# Patient Record
Sex: Male | Born: 1950 | Race: White | Hispanic: No | Marital: Married | Smoking: Former smoker
Health system: Southern US, Community
[De-identification: ages and names within clinical notes are randomized; demographics above are authoritative.]

## PROBLEM LIST (undated history)

## (undated) DIAGNOSIS — I639 Cerebral infarction, unspecified: Secondary | ICD-10-CM

## (undated) DIAGNOSIS — I447 Left bundle-branch block, unspecified: Secondary | ICD-10-CM

## (undated) DIAGNOSIS — E785 Hyperlipidemia, unspecified: Secondary | ICD-10-CM

## (undated) DIAGNOSIS — Z950 Presence of cardiac pacemaker: Secondary | ICD-10-CM

## (undated) DIAGNOSIS — I509 Heart failure, unspecified: Secondary | ICD-10-CM

## (undated) DIAGNOSIS — I10 Essential (primary) hypertension: Secondary | ICD-10-CM

## (undated) DIAGNOSIS — I255 Ischemic cardiomyopathy: Secondary | ICD-10-CM

## (undated) DIAGNOSIS — E119 Type 2 diabetes mellitus without complications: Secondary | ICD-10-CM

## (undated) DIAGNOSIS — I5020 Unspecified systolic (congestive) heart failure: Secondary | ICD-10-CM

## (undated) DIAGNOSIS — I2510 Atherosclerotic heart disease of native coronary artery without angina pectoris: Secondary | ICD-10-CM

## (undated) DIAGNOSIS — T783XXA Angioneurotic edema, initial encounter: Secondary | ICD-10-CM

## (undated) HISTORY — PX: CHOLECYSTECTOMY: SHX55

## (undated) HISTORY — DX: Angioneurotic edema, initial encounter: T78.3XXA

## (undated) HISTORY — PX: CERVICAL DISC SURGERY: SHX588

---

## 2012-10-27 DIAGNOSIS — Z8673 Personal history of transient ischemic attack (TIA), and cerebral infarction without residual deficits: Secondary | ICD-10-CM | POA: Insufficient documentation

## 2015-02-18 DIAGNOSIS — I10 Essential (primary) hypertension: Secondary | ICD-10-CM | POA: Insufficient documentation

## 2015-02-18 DIAGNOSIS — E785 Hyperlipidemia, unspecified: Secondary | ICD-10-CM | POA: Insufficient documentation

## 2015-02-18 DIAGNOSIS — K219 Gastro-esophageal reflux disease without esophagitis: Secondary | ICD-10-CM | POA: Insufficient documentation

## 2015-02-18 DIAGNOSIS — F419 Anxiety disorder, unspecified: Secondary | ICD-10-CM | POA: Insufficient documentation

## 2015-02-21 ENCOUNTER — Emergency Department
Admission: EM | Admit: 2015-02-21 | Discharge: 2015-02-21 | Disposition: A | Discharge status: Home / Self Care | Payer: Medicare HMO | Attending: Emergency Medicine | Admitting: Emergency Medicine

## 2015-02-21 DIAGNOSIS — S0502XA Injury of conjunctiva and corneal abrasion without foreign body, left eye, initial encounter: Secondary | ICD-10-CM | POA: Diagnosis not present

## 2015-02-21 DIAGNOSIS — W2209XA Striking against other stationary object, initial encounter: Secondary | ICD-10-CM | POA: Insufficient documentation

## 2015-02-21 DIAGNOSIS — E119 Type 2 diabetes mellitus without complications: Secondary | ICD-10-CM | POA: Insufficient documentation

## 2015-02-21 DIAGNOSIS — Y9389 Activity, other specified: Secondary | ICD-10-CM | POA: Insufficient documentation

## 2015-02-21 DIAGNOSIS — I10 Essential (primary) hypertension: Secondary | ICD-10-CM | POA: Insufficient documentation

## 2015-02-21 DIAGNOSIS — Y998 Other external cause status: Secondary | ICD-10-CM | POA: Diagnosis not present

## 2015-02-21 DIAGNOSIS — Y9289 Other specified places as the place of occurrence of the external cause: Secondary | ICD-10-CM | POA: Insufficient documentation

## 2015-02-21 DIAGNOSIS — S0592XA Unspecified injury of left eye and orbit, initial encounter: Secondary | ICD-10-CM | POA: Diagnosis present

## 2015-02-21 HISTORY — DX: Cerebral infarction, unspecified: I63.9

## 2015-02-21 HISTORY — DX: Type 2 diabetes mellitus without complications: E11.9

## 2015-02-21 HISTORY — DX: Essential (primary) hypertension: I10

## 2015-02-21 MED ORDER — EYE WASH OPHTH SOLN
OPHTHALMIC | Status: AC
  Administered 2015-02-21: 22:00:00
  Filled 2015-02-21: qty 118

## 2015-02-21 MED ORDER — FLUORESCEIN SODIUM 1 MG OP STRP
ORAL_STRIP | OPHTHALMIC | Status: AC
  Administered 2015-02-21: 22:00:00
  Filled 2015-02-21: qty 1

## 2015-02-21 MED ORDER — TETRACAINE HCL 0.5 % OP SOLN
OPHTHALMIC | Status: AC
  Administered 2015-02-21: 22:00:00
  Filled 2015-02-21: qty 2

## 2015-02-21 MED ORDER — GENTAMICIN SULFATE 0.3 % OP OINT: TOPICAL_OINTMENT | Freq: Three times a day (TID) | OPHTHALMIC | Status: DC

## 2015-02-21 NOTE — ED Notes (Signed)
Pt arrives to ER via POV with wife c/o left eye injury. Pt had a branch hit his left eye approx 6PM today. Pain and discharge since. Pt unable to open eye fully at time of triage. Pt alert and oriented X4, active, cooperative, pt in NAD. RR even and unlabored, color WNL.

## 2015-02-21 NOTE — ED Notes (Signed)
Visual acuity right eye 20/50. Left eye-unable to see any of the letter. Pt wearing corrective glasses.

## 2015-02-21 NOTE — ED Provider Notes (Signed)
[facility] Emergency Department Provider Note  ____________________________________________  Time seen: Approximately 9:23 PM  I have reviewed the triage vital signs and the nursing notes.   HISTORY  Chief Complaint Eye Injury    HPI Adam Douglas is a 65 y.o. male left eye pain secondary to a branch flying to his eye approximately 2 hours ago. Patient state unable to open secondary to pain. No palliative measures taken prior to arrival. He rates his pain as a 7/10. Patient described the pain as "sharp".   Past Medical History  Diagnosis Date  . Hypertension   . Diabetes mellitus without complication (HCC)   . Stroke [facility])     There are no active problems to display for this patient.   History reviewed. No pertinent past surgical history.  Current Outpatient Rx  Name  Route  Sig  Dispense  Refill  . gentamicin (GARAMYCIN) 0.3 % ophthalmic ointment   Left Eye   Place into the left eye 3 (three) times daily.   3.5 g   0     Allergies Review of patient's allergies indicates no known allergies.  No family history on file.  Social History Social History  Substance Use Topics  . Smoking status: Never Smoker   . Smokeless tobacco: None  . Alcohol Use: No    Review of Systems Constitutional: No fever/chills Eyes: No visual changes. ENT: No sore throat. Cardiovascular: Denies chest pain. Respiratory: Denies shortness of breath. Gastrointestinal: No abdominal pain.  No nausea, no vomiting.  No diarrhea.  No constipation. Genitourinary: Negative for dysuria. Musculoskeletal: Negative for back pain. Skin: Negative for rash. Neurological: Negative for headaches, focal weakness or numbness. Endocrine:Hypertension and diabetes ____________________________________________   PHYSICAL EXAM:  VITAL SIGNS: ED Triage Vitals  Enc Vitals Group     BP 02/21/15 2103 161/74 mmHg     Pulse Rate 02/21/15 2103 51     Resp 02/21/15 2103 18   Temp 02/21/15 2103 98.3 F (36.8 C)     Temp Source 02/21/15 2103 Oral     SpO2 02/21/15 2103 96 %     Weight 02/21/15 2103 222 lb (100.699 kg)     Height 02/21/15 2103 5' 9.5" (1.765 m)     Head Cir --      Peak Flow --      Pain Score 02/21/15 2107 7     Pain Loc --      Pain Edu? --      Excl. in GC? --     Constitutional: Alert and oriented. Well appearing and in no acute distress. Eyes: Conjunctivae are normal. PERRL. EOMI. first day revealed cornea abrasion inferior aspect. Head: Atraumatic. Nose: No congestion/rhinnorhea. Mouth/Throat: Mucous membranes are moist.  Oropharynx non-erythematous. Neck: No stridor.  No cervical spine tenderness to palpation. Hematological/Lymphatic/Immunilogical: No cervical lymphadenopathy. Cardiovascular: Normal rate, regular rhythm. Grossly normal heart sounds.  Good peripheral circulation. Blood pressure Respiratory: Normal respiratory effort.  No retractions. Lungs CTAB. Gastrointestinal: Soft and nontender. No distention. No abdominal bruits. No CVA tenderness. Musculoskeletal: No lower extremity tenderness nor edema.  No joint effusions. Neurologic:  Normal speech and language. No gross focal neurologic deficits are appreciated. No gait instability. Skin:  Skin is warm, dry and intact. No rash noted. Psychiatric: Mood and affect are normal. Speech and behavior are normal.  ____________________________________________   LABS (all labs ordered are listed, but only abnormal results are displayed)  Labs Reviewed - No data to display ____________________________________________  EKG   ____________________________________________  RADIOLOGY   ____________________________________________   PROCEDURES  Procedure(s) performed: None  Critical Care performed: No  ____________________________________________   INITIAL IMPRESSION / ASSESSMENT AND PLAN / ED COURSE  Pertinent labs & imaging results that were available during my  care of the patient were reviewed by me and considered in my medical decision making (see chart for details).  Left corneal abrasion. Skin discharged care instructions. Patient given antibiotic and advised follow-up with his eye doctor within 3 days. ____________________________________________   FINAL CLINICAL IMPRESSION(S) / ED DIAGNOSES  Final diagnoses:  Left corneal abrasion, initial encounter       Joni Reining, PA-C 02/21/15 2139  Myrna Blazer, MD 02/21/15 2322

## 2015-02-21 NOTE — Discharge Instructions (Signed)
Corneal Abrasion °The cornea is the clear covering at the front and center of the eye. When you look at the colored portion of the eye, you are looking through the cornea. It is a thin tissue made up of layers. The top layer is the most sensitive layer. A corneal abrasion happens if this layer is scratched or an injury causes it to come off.  °HOME CARE °· You may be given drops or a medicated cream. Use the medicine as told by your doctor. °· A pressure patch may be put over the eye. If this is done, follow your doctor's instructions for when to remove the patch. Do not drive or use machines while the eye patch is on. Judging distances is hard to do with a patch on. °· See your doctor for a follow-up exam if you are told to do so. It is very important that you keep this appointment. °GET HELP IF:  °· You have pain, are sensitive to light, and have a scratchy feeling in one eye or both eyes. °· Your pressure patch keeps getting loose. You can blink your eye under the patch. °· You have fluid coming from your eye or the lids stick together in the morning. °· You have the same symptoms in the morning that you did with the first abrasion. This could be days, weeks, or months after the first abrasion healed. °  °This information is not intended to replace advice given to you by your health care provider. Make sure you discuss any questions you have with your health care provider. °  °Document Released: 06/10/2007 Document Revised: 09/12/2014 Document Reviewed: 08/29/2012 °Elsevier Interactive Patient Education ©2016 [employer] ° °

## 2016-06-23 DIAGNOSIS — E1169 Type 2 diabetes mellitus with other specified complication: Secondary | ICD-10-CM | POA: Insufficient documentation

## 2016-06-23 DIAGNOSIS — Z794 Long term (current) use of insulin: Secondary | ICD-10-CM

## 2016-06-23 DIAGNOSIS — E119 Type 2 diabetes mellitus without complications: Secondary | ICD-10-CM | POA: Insufficient documentation

## 2017-03-02 ENCOUNTER — Inpatient Hospital Stay
Admission: EM | Admit: 2017-03-02 | Discharge: 2017-03-06 | DRG: 280 | Disposition: A | Discharge status: Home / Self Care | Payer: Medicare HMO | Attending: Internal Medicine | Admitting: Internal Medicine

## 2017-03-02 ENCOUNTER — Encounter: Payer: Self-pay | Admitting: Emergency Medicine

## 2017-03-02 ENCOUNTER — Emergency Department: Payer: Medicare HMO

## 2017-03-02 ENCOUNTER — Other Ambulatory Visit: Payer: Self-pay

## 2017-03-02 DIAGNOSIS — R0602 Shortness of breath: Secondary | ICD-10-CM

## 2017-03-02 DIAGNOSIS — N183 Chronic kidney disease, stage 3 (moderate): Secondary | ICD-10-CM | POA: Diagnosis present

## 2017-03-02 DIAGNOSIS — I2510 Atherosclerotic heart disease of native coronary artery without angina pectoris: Secondary | ICD-10-CM | POA: Diagnosis present

## 2017-03-02 DIAGNOSIS — Z6834 Body mass index (BMI) 34.0-34.9, adult: Secondary | ICD-10-CM

## 2017-03-02 DIAGNOSIS — E669 Obesity, unspecified: Secondary | ICD-10-CM | POA: Diagnosis present

## 2017-03-02 DIAGNOSIS — I130 Hypertensive heart and chronic kidney disease with heart failure and stage 1 through stage 4 chronic kidney disease, or unspecified chronic kidney disease: Secondary | ICD-10-CM | POA: Diagnosis present

## 2017-03-02 DIAGNOSIS — I5023 Acute on chronic systolic (congestive) heart failure: Secondary | ICD-10-CM | POA: Diagnosis present

## 2017-03-02 DIAGNOSIS — I214 Non-ST elevation (NSTEMI) myocardial infarction: Principal | ICD-10-CM | POA: Diagnosis present

## 2017-03-02 DIAGNOSIS — I248 Other forms of acute ischemic heart disease: Secondary | ICD-10-CM | POA: Diagnosis not present

## 2017-03-02 DIAGNOSIS — E1122 Type 2 diabetes mellitus with diabetic chronic kidney disease: Secondary | ICD-10-CM | POA: Diagnosis present

## 2017-03-02 DIAGNOSIS — Z87891 Personal history of nicotine dependence: Secondary | ICD-10-CM

## 2017-03-02 DIAGNOSIS — R778 Other specified abnormalities of plasma proteins: Secondary | ICD-10-CM

## 2017-03-02 DIAGNOSIS — Z79899 Other long term (current) drug therapy: Secondary | ICD-10-CM

## 2017-03-02 DIAGNOSIS — I509 Heart failure, unspecified: Secondary | ICD-10-CM

## 2017-03-02 DIAGNOSIS — R7989 Other specified abnormal findings of blood chemistry: Secondary | ICD-10-CM

## 2017-03-02 DIAGNOSIS — N179 Acute kidney failure, unspecified: Secondary | ICD-10-CM | POA: Diagnosis not present

## 2017-03-02 DIAGNOSIS — E785 Hyperlipidemia, unspecified: Secondary | ICD-10-CM | POA: Diagnosis not present

## 2017-03-02 DIAGNOSIS — Z794 Long term (current) use of insulin: Secondary | ICD-10-CM

## 2017-03-02 DIAGNOSIS — I447 Left bundle-branch block, unspecified: Secondary | ICD-10-CM | POA: Diagnosis present

## 2017-03-02 DIAGNOSIS — I420 Dilated cardiomyopathy: Secondary | ICD-10-CM | POA: Diagnosis present

## 2017-03-02 DIAGNOSIS — Z8673 Personal history of transient ischemic attack (TIA), and cerebral infarction without residual deficits: Secondary | ICD-10-CM

## 2017-03-02 DIAGNOSIS — J9601 Acute respiratory failure with hypoxia: Secondary | ICD-10-CM | POA: Diagnosis present

## 2017-03-02 HISTORY — DX: Atherosclerotic heart disease of native coronary artery without angina pectoris: I25.10

## 2017-03-02 HISTORY — DX: Unspecified systolic (congestive) heart failure: I50.20

## 2017-03-02 HISTORY — DX: Left bundle-branch block, unspecified: I44.7

## 2017-03-02 HISTORY — DX: Hyperlipidemia, unspecified: E78.5

## 2017-03-02 HISTORY — DX: Ischemic cardiomyopathy: I25.5

## 2017-03-02 HISTORY — DX: Morbid (severe) obesity due to excess calories: E66.01

## 2017-03-02 LAB — CBC WITH DIFFERENTIAL/PLATELET
Basophils Absolute: 0 10*3/uL (ref 0–0.1)
Basophils Relative: 0 %
EOS PCT: 1 %
Eosinophils Absolute: 0.1 10*3/uL (ref 0–0.7)
HEMATOCRIT: 41.6 % (ref 40.0–52.0)
Hemoglobin: 13.9 g/dL (ref 13.0–18.0)
LYMPHS PCT: 7 %
Lymphs Abs: 0.9 10*3/uL — ABNORMAL LOW (ref 1.0–3.6)
MCH: 31.4 pg (ref 26.0–34.0)
MCHC: 33.4 g/dL (ref 32.0–36.0)
MCV: 94.1 fL (ref 80.0–100.0)
MONO ABS: 0.7 10*3/uL (ref 0.2–1.0)
MONOS PCT: 6 %
NEUTROS ABS: 11.1 10*3/uL — AB (ref 1.4–6.5)
Neutrophils Relative %: 86 %
PLATELETS: 202 10*3/uL (ref 150–440)
RBC: 4.42 MIL/uL (ref 4.40–5.90)
RDW: 13.8 % (ref 11.5–14.5)
WBC: 12.9 10*3/uL — ABNORMAL HIGH (ref 3.8–10.6)

## 2017-03-02 LAB — BLOOD GAS, VENOUS
Acid-base deficit: 1.7 mmol/L (ref 0.0–2.0)
Bicarbonate: 22.1 mmol/L (ref 20.0–28.0)
O2 SAT: 91.1 %
PATIENT TEMPERATURE: 37
pCO2, Ven: 34 mmHg — ABNORMAL LOW (ref 44.0–60.0)
pH, Ven: 7.42 (ref 7.250–7.430)
pO2, Ven: 60 mmHg — ABNORMAL HIGH (ref 32.0–45.0)

## 2017-03-02 MED ORDER — IPRATROPIUM-ALBUTEROL 0.5-2.5 (3) MG/3ML IN SOLN
RESPIRATORY_TRACT | Status: AC
  Administered 2017-03-02: 3 mL via RESPIRATORY_TRACT
  Filled 2017-03-02: qty 3

## 2017-03-02 MED ORDER — IPRATROPIUM-ALBUTEROL 0.5-2.5 (3) MG/3ML IN SOLN
3.0000 mL | Freq: Once | RESPIRATORY_TRACT | Status: AC
  Administered 2017-03-02: 3 mL via RESPIRATORY_TRACT

## 2017-03-02 NOTE — ED Triage Notes (Signed)
Pt arrived to ED via EMS from home where EMS reports pt has had increased SOB for past 2 hours. Pt received 1 duoneb in route as well as 125 of solumedrol. Pt is on non-rebreather at 10L on arrival to ED with 97%. Pt is A&O x4. MD at bedside for further eval.

## 2017-03-02 NOTE — ED Provider Notes (Signed)
[facility] Emergency Department Provider Note  ____________________________________________   First MD Initiated Contact with Patient 03/02/17 2314     (approximate)  I have reviewed the triage vital signs and the nursing notes.   HISTORY  Chief Complaint Shortness of Breath  Level 5 caveat:  history/ROS limited by acute/critical illness  HPI Adam Douglas is a 67 y.o. male with medical history as listed below who presents by EMS for evaluation of acute onset severe shortness of breath.  He reports that within the last couple of hours he became very short of breath even at rest and nothing was making it feel better.  He eventually had to call 911.  They did not get a good room air saturation initially but started him on a nonrebreather on 10 L given that he was retracting and had crackles throughout with significantly increased work of breathing.  On 10 L facemask he is about 98%, and he was down around 90 reportedly for a brief period of time on 4 L by nasal cannula.  He feels better on the oxygen but still feels "crackly".  He denies any recent illness including nasal congestion, runny nose, fever/chills.  He also denies chest pain, nausea, vomiting, abdominal pain, episodes of diaphoresis, and dysuria.  He reports that he has a history of a left bundle branch block but denies having had an MI and denies a history of CHF.  He was a former smoker but quit more than 30 years ago and has no diagnosis of COPD or any other lung disease.  His shortness of breath was severe and only the oxygen by facemask helped him feel better.  Exertion made the symptoms worse.  Past Medical History:  Diagnosis Date  . Diabetes mellitus without complication (HCC)   . Hypertension   . LBBB (left bundle branch block)    reported by patient as part of his medical history  . Stroke [facility])     There are no active problems to display for this patient.   History reviewed. No  pertinent surgical history.  Prior to Admission medications   Medication Sig Start Date End Date Taking? Authorizing Provider  gentamicin (GARAMYCIN) 0.3 % ophthalmic ointment Place into the left eye 3 (three) times daily. 02/21/15   Joni Reining, PA-C    Allergies Patient has no known allergies.  History reviewed. No pertinent family history.  Social History Social History   Tobacco Use  . Smoking status: Former Games developer  . Smokeless tobacco: Never Used  Substance Use Topics  . Alcohol use: No  . Drug use: Not on file    Review of Systems Level 5 caveat:  history/ROS may be limited by acute/critical illness  Constitutional: No fever/chills Eyes: No visual changes. ENT: No sore throat. Cardiovascular: Denies chest pain. Respiratory: Severe shortness of breath as described above Gastrointestinal: No abdominal pain.  No nausea, no vomiting.  No diarrhea.  No constipation. Genitourinary: Negative for dysuria. Musculoskeletal: Negative for neck pain.  Negative for back pain. Integumentary: Negative for rash. Neurological: Negative for headaches, focal weakness or numbness.   ____________________________________________   PHYSICAL EXAM:  ED Triage Vitals [03/02/17 2320]  Enc Vitals Group     BP 140/77     Pulse Rate (!) 118     Resp (!) 26     Temp 98.4 F (36.9 C)     Temp Source Oral     SpO2 97 %     Weight 100.7 kg (222  lb)     Height      Head Circumference      Peak Flow      Pain Score      Pain Loc      Pain Edu?      Excl. in GC?     Constitutional: Alert and oriented.  Moderate respiratory distress on nonrebreather Eyes: Conjunctivae are normal.  Head: Atraumatic. Nose: No congestion/rhinnorhea. Mouth/Throat: Mucous membranes are moist. Neck: No stridor.  No meningeal signs.   Cardiovascular: Tachycardia with regular rhythm. Good peripheral circulation. Grossly normal heart sounds. Respiratory: Increased respiratory effort and rate with  intercostal retractions and accessory muscle usage.  Coarse, crackly breath sounds throughout, especially in the bases, but no wheezing Gastrointestinal: Obese, soft and nontender. No distention.  Musculoskeletal: No lower extremity tenderness nor edema. No gross deformities of extremities. Neurologic:  Normal speech and language. No gross focal neurologic deficits are appreciated.  Skin:  Skin is warm, dry and intact. No rash noted. Psychiatric: Mood and affect are normal. Speech and behavior are normal.  ____________________________________________   LABS (all labs ordered are listed, but only abnormal results are displayed)  Labs Reviewed  CBC WITH DIFFERENTIAL/PLATELET - Abnormal; Notable for the following components:      Result Value   WBC 12.9 (*)    Neutro Abs 11.1 (*)    Lymphs Abs 0.9 (*)    All other components within normal limits  BRAIN NATRIURETIC PEPTIDE - Abnormal; Notable for the following components:   B Natriuretic Peptide 488.0 (*)    All other components within normal limits  BLOOD GAS, VENOUS - Abnormal; Notable for the following components:   pCO2, Ven 34 (*)    pO2, Ven 60.0 (*)    All other components within normal limits  LACTIC ACID, PLASMA  PROTIME-INR  APTT  TROPONIN I  COMPREHENSIVE METABOLIC PANEL  LIPASE, BLOOD  LACTIC ACID, PLASMA  INFLUENZA PANEL BY PCR (TYPE A & B)   ____________________________________________  EKG  ED ECG REPORT I, Loleta Rose, the attending physician, personally viewed and interpreted this ECG.  Date: 03/02/2017 EKG Time: 23: 15 Rate: 116 Rhythm: Sinus tachycardia QRS Axis: normal Intervals: Left bundle branch block ST/T Wave abnormalities: Non-specific ST segment / T-wave changes, but no evidence of acute ischemia. Narrative Interpretation: no evidence of acute ischemia   ____________________________________________  RADIOLOGY I, Loleta Rose, personally viewed and evaluated these images (plain  radiographs) as part of my medical decision making, as well as reviewing the written report by the radiologist.  ED MD interpretation: Mild pulmonary edema and significant cardiomegaly  Official radiology report(s): Dg Chest Portable 1 View  Result Date: 03/02/2017 CLINICAL DATA:  Short of breath EXAM: PORTABLE CHEST 1 VIEW COMPARISON:  None. FINDINGS: Cardiomegaly with vascular congestion and diffuse interstitial opacities suspicious for pulmonary edema. No pleural effusion. No focal consolidation. No pneumothorax. IMPRESSION: Cardiomegaly with vascular congestion and diffuse interstitial opacities suspicious for pulmonary edema Electronically Signed   By: Jasmine Pang M.D.   On: 03/02/2017 23:32    ____________________________________________   PROCEDURES  Critical Care performed: Yes, see critical care procedure note(s)   Procedure(s) performed:   .Critical Care Performed by: Loleta Rose, MD Authorized by: Loleta Rose, MD   Critical care provider statement:    Critical care time (minutes):  45   Critical care time was exclusive of:  Separately billable procedures and treating other patients   Critical care was necessary to treat or prevent imminent or life-threatening deterioration  of the following conditions:  Respiratory failure   Critical care was time spent personally by me on the following activities:  Development of treatment plan with patient or surrogate, discussions with consultants, evaluation of patient's response to treatment, examination of patient, obtaining history from patient or surrogate, ordering and performing treatments and interventions, ordering and review of laboratory studies, ordering and review of radiographic studies, pulse oximetry, re-evaluation of patient's condition and review of old charts      ____________________________________________   INITIAL IMPRESSION / ASSESSMENT AND PLAN / ED COURSE  As part of my medical decision making, I  reviewed the following data within the electronic MEDICAL RECORD NUMBER Nursing notes reviewed and incorporated, Labs reviewed , EKG interpreted , Old chart reviewed and Radiograph reviewed     Differential includes, but is not limited to, viral syndrome, bronchitis including COPD exacerbation, pneumonia, reactive airway disease including asthma, CHF including exacerbation with or without pulmonary/interstitial edema, pneumothorax, ACS, thoracic trauma, and pulmonary embolism.  The patient's presentation is most consistent with CHF even though he does not have a diagnosis.  I believe he has probably had a chronic heart failure of which he was not aware and now it has become acute.  I will give him a breathing treatment and he received Solu-Medrol and a breathing treatment prior to arrival, but he has no history of COPD and he has no wheezing.  He has no infectious signs or symptoms at this time, will check for influenza because he will need admission.  I have transitioned him from nonrebreather to 4 L nasal cannula to see how he does.  Standard lab work and evaluation is pending.    Clinical Course as of Mar 03 56  Wed Mar 03, 2017  0004 I reassessed the patient and he continues to use accessory muscles and have retractions.  He feels better now than he did before.  His oxygen saturations around 91-92% on 4 L by nasal cannula.  His chest x-ray is consistent with pulmonary edema which fits clinically as well.  His wife is now present and reports that he has been increasingly short of breath with any amount of exertion over the last few weeks and has become severe recently.Given the pulmonary edema and the increased work of breathing I will put him on BiPAP although my hope is that he will be able to come off of it before admission.  Lab work is still pending and I would like to see his metabolic panel prior to giving furosemide to make sure there are no significant electrolyte or potassium abnormalities but  anticipate we will begin diuresis.  He will require admission for new onset CHF requiring oxygen and positive pressure ventilation, even if it is just temporary.  [CF]  0040 Call from the lab to report that the troponin is 0.29.  This may simply reflect demand ischemia, but it is also possible he had an NSTEMI.  This would explain the acute onset shortness of breath.  I have ordered heparin bolus and infusion, aspirin 324 mg, and will continue the BiPAP.  I ordered Lasix for the pulmonary edema but only 20 mg because if he is having an infarction, I do not want to drop his preload too much.  His heart rate is down to about 107 now that he is on BiPAP.  I called and discussed the case with Dr. Anne Hahn the hospitalist who agrees with my plan and will admit.  I updated the family at bedside as  well.  [CF]    Clinical Course User Index [CF] Loleta Rose, MD    ____________________________________________  FINAL CLINICAL IMPRESSION(S) / ED DIAGNOSES  Final diagnoses:  Acute congestive heart failure, unspecified heart failure type (HCC)  Acute respiratory failure with hypoxemia (HCC)  Elevated troponin I level  Demand ischemia (HCC)     MEDICATIONS GIVEN DURING THIS VISIT:  Medications  furosemide (LASIX) injection 20 mg  heparin injection 4,000 Units  aspirin chewable tablet 324 mg  ipratropium-albuterol (DUONEB) 0.5-2.5 (3) MG/3ML nebulizer solution 3 mL (3 mLs Nebulization Given 03/02/17 2341)     ED Discharge Orders    None       Note:  This document was prepared using Dragon voice recognition software and may include unintentional dictation errors.    Loleta Rose, MD 03/03/17 [phone]

## 2017-03-02 NOTE — ED Notes (Signed)
Patient was 91% on 4L via nasal cannula prior to starting duoneb.

## 2017-03-03 ENCOUNTER — Other Ambulatory Visit: Payer: Self-pay

## 2017-03-03 ENCOUNTER — Encounter
Admission: EM | Disposition: A | Discharge status: Home / Self Care | Payer: Self-pay | Source: Home / Self Care | Attending: Internal Medicine

## 2017-03-03 ENCOUNTER — Inpatient Hospital Stay (HOSPITAL_COMMUNITY)
Admit: 2017-03-03 | Discharge: 2017-03-03 | Disposition: A | Discharge status: Home / Self Care | Payer: Medicare HMO | Attending: Internal Medicine | Admitting: Internal Medicine

## 2017-03-03 DIAGNOSIS — I509 Heart failure, unspecified: Secondary | ICD-10-CM | POA: Diagnosis not present

## 2017-03-03 DIAGNOSIS — E785 Hyperlipidemia, unspecified: Secondary | ICD-10-CM | POA: Diagnosis not present

## 2017-03-03 DIAGNOSIS — E1159 Type 2 diabetes mellitus with other circulatory complications: Secondary | ICD-10-CM | POA: Diagnosis not present

## 2017-03-03 DIAGNOSIS — I5023 Acute on chronic systolic (congestive) heart failure: Secondary | ICD-10-CM | POA: Diagnosis present

## 2017-03-03 DIAGNOSIS — I340 Nonrheumatic mitral (valve) insufficiency: Secondary | ICD-10-CM

## 2017-03-03 DIAGNOSIS — I248 Other forms of acute ischemic heart disease: Secondary | ICD-10-CM | POA: Diagnosis present

## 2017-03-03 DIAGNOSIS — J9601 Acute respiratory failure with hypoxia: Secondary | ICD-10-CM | POA: Diagnosis present

## 2017-03-03 DIAGNOSIS — N179 Acute kidney failure, unspecified: Secondary | ICD-10-CM | POA: Diagnosis not present

## 2017-03-03 DIAGNOSIS — I25118 Atherosclerotic heart disease of native coronary artery with other forms of angina pectoris: Secondary | ICD-10-CM | POA: Diagnosis not present

## 2017-03-03 DIAGNOSIS — I214 Non-ST elevation (NSTEMI) myocardial infarction: Secondary | ICD-10-CM | POA: Diagnosis present

## 2017-03-03 DIAGNOSIS — N183 Chronic kidney disease, stage 3 (moderate): Secondary | ICD-10-CM | POA: Diagnosis present

## 2017-03-03 DIAGNOSIS — Z794 Long term (current) use of insulin: Secondary | ICD-10-CM | POA: Diagnosis not present

## 2017-03-03 DIAGNOSIS — R0602 Shortness of breath: Secondary | ICD-10-CM | POA: Diagnosis not present

## 2017-03-03 DIAGNOSIS — I420 Dilated cardiomyopathy: Secondary | ICD-10-CM | POA: Diagnosis present

## 2017-03-03 DIAGNOSIS — E1122 Type 2 diabetes mellitus with diabetic chronic kidney disease: Secondary | ICD-10-CM | POA: Diagnosis present

## 2017-03-03 DIAGNOSIS — I2510 Atherosclerotic heart disease of native coronary artery without angina pectoris: Secondary | ICD-10-CM | POA: Diagnosis present

## 2017-03-03 DIAGNOSIS — E669 Obesity, unspecified: Secondary | ICD-10-CM | POA: Diagnosis present

## 2017-03-03 DIAGNOSIS — I130 Hypertensive heart and chronic kidney disease with heart failure and stage 1 through stage 4 chronic kidney disease, or unspecified chronic kidney disease: Secondary | ICD-10-CM | POA: Diagnosis present

## 2017-03-03 DIAGNOSIS — Z8673 Personal history of transient ischemic attack (TIA), and cerebral infarction without residual deficits: Secondary | ICD-10-CM | POA: Diagnosis not present

## 2017-03-03 DIAGNOSIS — Z6834 Body mass index (BMI) 34.0-34.9, adult: Secondary | ICD-10-CM | POA: Diagnosis not present

## 2017-03-03 DIAGNOSIS — Z87891 Personal history of nicotine dependence: Secondary | ICD-10-CM | POA: Diagnosis not present

## 2017-03-03 DIAGNOSIS — Z79899 Other long term (current) drug therapy: Secondary | ICD-10-CM | POA: Diagnosis not present

## 2017-03-03 DIAGNOSIS — I447 Left bundle-branch block, unspecified: Secondary | ICD-10-CM | POA: Diagnosis present

## 2017-03-03 HISTORY — PX: LEFT HEART CATH AND CORONARY ANGIOGRAPHY: CATH118249

## 2017-03-03 LAB — GLUCOSE, CAPILLARY
Glucose-Capillary: 346 mg/dL — ABNORMAL HIGH (ref 65–99)
Glucose-Capillary: 278 mg/dL — ABNORMAL HIGH (ref 65–99)
Glucose-Capillary: 186 mg/dL — ABNORMAL HIGH (ref 65–99)
Glucose-Capillary: 232 mg/dL — ABNORMAL HIGH (ref 65–99)
Glucose-Capillary: 163 mg/dL — ABNORMAL HIGH (ref 65–99)

## 2017-03-03 LAB — TSH: TSH: 0.895 u[IU]/mL (ref 0.350–4.500)

## 2017-03-03 LAB — ECHOCARDIOGRAM COMPLETE
Height: 68 in
Weight: 3760 oz

## 2017-03-03 LAB — COMPREHENSIVE METABOLIC PANEL
ALK PHOS: 89 U/L (ref 38–126)
ALT: 31 U/L (ref 17–63)
ANION GAP: 9 (ref 5–15)
AST: 40 U/L (ref 15–41)
Albumin: 3.8 g/dL (ref 3.5–5.0)
BILIRUBIN TOTAL: 0.7 mg/dL (ref 0.3–1.2)
BUN: 19 mg/dL (ref 6–20)
CALCIUM: 8.7 mg/dL — AB (ref 8.9–10.3)
CO2: 22 mmol/L (ref 22–32)
CREATININE: 1.17 mg/dL (ref 0.61–1.24)
Chloride: 108 mmol/L (ref 101–111)
GFR calc Af Amer: >60 mL/min (ref >60)
Glucose, Bld: 321 mg/dL — ABNORMAL HIGH (ref 65–99)
Potassium: 4.2 mmol/L (ref 3.5–5.1)
Sodium: 139 mmol/L (ref 135–145)
TOTAL PROTEIN: 7 g/dL (ref 6.5–8.1)

## 2017-03-03 LAB — CBC
HCT: 40.7 % (ref 40.0–52.0)
Hemoglobin: 13.8 g/dL (ref 13.0–18.0)
MCH: 32.1 pg (ref 26.0–34.0)
MCHC: 34 g/dL (ref 32.0–36.0)
MCV: 94.4 fL (ref 80.0–100.0)
Platelets: 207 10*3/uL (ref 150–440)
RBC: 4.31 MIL/uL — ABNORMAL LOW (ref 4.40–5.90)
RDW: 13.9 % (ref 11.5–14.5)
WBC: 11.9 10*3/uL — ABNORMAL HIGH (ref 3.8–10.6)

## 2017-03-03 LAB — BRAIN NATRIURETIC PEPTIDE: B Natriuretic Peptide: 488 pg/mL — ABNORMAL HIGH (ref 0.0–100.0)

## 2017-03-03 LAB — PROTIME-INR
INR: 1.02
Prothrombin Time: 13.3 seconds (ref 11.4–15.2)

## 2017-03-03 LAB — HEMOGLOBIN A1C
Hgb A1c MFr Bld: 7.1 % — ABNORMAL HIGH (ref 4.8–5.6)
Mean Plasma Glucose: 157.07 mg/dL

## 2017-03-03 LAB — LACTIC ACID, PLASMA: LACTIC ACID, VENOUS: 1.7 mmol/L (ref 0.5–1.9)

## 2017-03-03 LAB — TROPONIN I
Troponin I: 0.29 ng/mL (ref <0.03)
Troponin I: 6.69 ng/mL (ref <0.03)
Troponin I: 12.31 ng/mL (ref <0.03)
Troponin I: 11.12 ng/mL (ref <0.03)

## 2017-03-03 LAB — APTT: aPTT: 29 seconds (ref 24–36)

## 2017-03-03 LAB — LIPASE, BLOOD: LIPASE: 30 U/L (ref 11–51)

## 2017-03-03 LAB — HEPARIN LEVEL (UNFRACTIONATED): Heparin Unfractionated: 0.14 IU/mL — ABNORMAL LOW (ref 0.30–0.70)

## 2017-03-03 LAB — INFLUENZA PANEL BY PCR (TYPE A & B)
Influenza A By PCR: NEGATIVE
Influenza B By PCR: NEGATIVE

## 2017-03-03 SURGERY — LEFT HEART CATH AND CORONARY ANGIOGRAPHY
Anesthesia: Moderate Sedation

## 2017-03-03 MED ORDER — ONDANSETRON HCL 4 MG/2ML IJ SOLN
4.0000 mg | Freq: Four times a day (QID) | INTRAMUSCULAR | Status: DC | PRN
  Administered 2017-03-04: 4 mg via INTRAVENOUS
  Filled 2017-03-03: qty 2

## 2017-03-03 MED ORDER — SODIUM CHLORIDE 0.9 % IV SOLN: 250.0000 mL | INTRAVENOUS | Status: DC | PRN

## 2017-03-03 MED ORDER — LISINOPRIL 20 MG PO TABS
20.0000 mg | ORAL_TABLET | Freq: Every day | ORAL | Status: DC
  Administered 2017-03-03 – 2017-03-04 (×2): 20 mg via ORAL
  Filled 2017-03-03 (×2): qty 1

## 2017-03-03 MED ORDER — CITALOPRAM HYDROBROMIDE 20 MG PO TABS
40.0000 mg | ORAL_TABLET | Freq: Every day | ORAL | Status: DC
  Administered 2017-03-03 – 2017-03-06 (×4): 40 mg via ORAL
  Filled 2017-03-03 (×4): qty 2

## 2017-03-03 MED ORDER — PANTOPRAZOLE SODIUM 40 MG PO TBEC
40.0000 mg | DELAYED_RELEASE_TABLET | Freq: Every day | ORAL | Status: DC
  Administered 2017-03-03 – 2017-03-06 (×4): 40 mg via ORAL
  Filled 2017-03-03 (×4): qty 1

## 2017-03-03 MED ORDER — ACETAMINOPHEN 650 MG RE SUPP: 650.0000 mg | Freq: Four times a day (QID) | RECTAL | Status: DC | PRN

## 2017-03-03 MED ORDER — CLOPIDOGREL BISULFATE 75 MG PO TABS
75.0000 mg | ORAL_TABLET | Freq: Every day | ORAL | Status: DC
  Administered 2017-03-04 – 2017-03-06 (×3): 75 mg via ORAL
  Filled 2017-03-03 (×3): qty 1

## 2017-03-03 MED ORDER — SODIUM CHLORIDE 0.9% FLUSH: 3.0000 mL | INTRAVENOUS | Status: DC | PRN

## 2017-03-03 MED ORDER — INSULIN GLARGINE 100 UNIT/ML ~~LOC~~ SOLN
15.0000 [IU] | Freq: Once | SUBCUTANEOUS | Status: AC
  Administered 2017-03-03: 15 [IU] via SUBCUTANEOUS
  Filled 2017-03-03: qty 0.15

## 2017-03-03 MED ORDER — HEPARIN BOLUS VIA INFUSION
2700.0000 [IU] | Freq: Once | INTRAVENOUS | Status: AC
Start: 2017-03-03 — End: 2017-03-03
  Administered 2017-03-03: 2700 [IU] via INTRAVENOUS
  Filled 2017-03-03: qty 2700

## 2017-03-03 MED ORDER — INSULIN ASPART 100 UNIT/ML ~~LOC~~ SOLN
0.0000 [IU] | Freq: Four times a day (QID) | SUBCUTANEOUS | Status: DC
Start: 2017-03-03 — End: 2017-03-03

## 2017-03-03 MED ORDER — TRAMADOL HCL 50 MG PO TABS: 50.0000 mg | ORAL_TABLET | Freq: Three times a day (TID) | ORAL | Status: DC | PRN

## 2017-03-03 MED ORDER — POLYVINYL ALCOHOL 1.4 % OP SOLN
1.0000 [drp] | Freq: Every day | OPHTHALMIC | Status: DC
  Administered 2017-03-03 – 2017-03-05 (×3): 1 [drp] via OPHTHALMIC
  Filled 2017-03-03: qty 15

## 2017-03-03 MED ORDER — FUROSEMIDE 10 MG/ML IJ SOLN
20.0000 mg | Freq: Once | INTRAMUSCULAR | Status: AC
  Administered 2017-03-03: 20 mg via INTRAVENOUS
  Filled 2017-03-03: qty 4

## 2017-03-03 MED ORDER — HEPARIN (PORCINE) IN NACL 100-0.45 UNIT/ML-% IJ SOLN
1400.0000 [IU]/h | INTRAMUSCULAR | Status: DC
  Administered 2017-03-03: 1100 [IU]/h via INTRAVENOUS
  Filled 2017-03-03: qty 250

## 2017-03-03 MED ORDER — ATORVASTATIN CALCIUM 20 MG PO TABS: 40.0000 mg | ORAL_TABLET | Freq: Every day | ORAL | Status: DC

## 2017-03-03 MED ORDER — CARVEDILOL 3.125 MG PO TABS
3.1250 mg | ORAL_TABLET | Freq: Two times a day (BID) | ORAL | Status: DC
  Administered 2017-03-03 – 2017-03-06 (×4): 3.125 mg via ORAL
  Filled 2017-03-03 (×5): qty 1

## 2017-03-03 MED ORDER — LISINOPRIL-HYDROCHLOROTHIAZIDE 20-12.5 MG PO TABS: 1.0000 | ORAL_TABLET | Freq: Every day | ORAL | Status: DC

## 2017-03-03 MED ORDER — ASPIRIN EC 81 MG PO TBEC
81.0000 mg | DELAYED_RELEASE_TABLET | Freq: Every day | ORAL | Status: DC
  Administered 2017-03-03 – 2017-03-06 (×4): 81 mg via ORAL
  Filled 2017-03-03 (×3): qty 1

## 2017-03-03 MED ORDER — INSULIN GLARGINE 100 UNIT/ML ~~LOC~~ SOLN
30.0000 [IU] | Freq: Every day | SUBCUTANEOUS | Status: DC
  Administered 2017-03-04 – 2017-03-05 (×2): 30 [IU] via SUBCUTANEOUS
  Filled 2017-03-03 (×3): qty 0.3

## 2017-03-03 MED ORDER — IOPAMIDOL (ISOVUE-300) INJECTION 61%
INTRAVENOUS | Status: DC | PRN
  Administered 2017-03-03: 140 mL via INTRA_ARTERIAL

## 2017-03-03 MED ORDER — ACETAMINOPHEN 325 MG PO TABS: 650.0000 mg | ORAL_TABLET | Freq: Four times a day (QID) | ORAL | Status: DC | PRN

## 2017-03-03 MED ORDER — HEPARIN (PORCINE) IN NACL 2-0.9 UNIT/ML-% IJ SOLN
INTRAMUSCULAR | Status: AC
  Filled 2017-03-03: qty 1000

## 2017-03-03 MED ORDER — HYDROCHLOROTHIAZIDE 12.5 MG PO CAPS: 12.5000 mg | ORAL_CAPSULE | Freq: Every day | ORAL | Status: DC

## 2017-03-03 MED ORDER — POLYETHYL GLYCOL-PROPYL GLYCOL 0.4-0.3 % OP SOLN: 1.0000 [drp] | Freq: Every day | OPHTHALMIC | Status: DC

## 2017-03-03 MED ORDER — INSULIN ASPART 100 UNIT/ML ~~LOC~~ SOLN
0.0000 [IU] | Freq: Four times a day (QID) | SUBCUTANEOUS | Status: DC
  Administered 2017-03-03: 11 [IU] via SUBCUTANEOUS
  Filled 2017-03-03: qty 1

## 2017-03-03 MED ORDER — INSULIN ASPART 100 UNIT/ML ~~LOC~~ SOLN: 0.0000 [IU] | Freq: Three times a day (TID) | SUBCUTANEOUS | Status: DC

## 2017-03-03 MED ORDER — DOCUSATE SODIUM 100 MG PO CAPS
100.0000 mg | ORAL_CAPSULE | Freq: Two times a day (BID) | ORAL | Status: DC
  Administered 2017-03-05: 100 mg via ORAL
  Filled 2017-03-03 (×3): qty 1

## 2017-03-03 MED ORDER — HEPARIN BOLUS VIA INFUSION
4000.0000 [IU] | Freq: Once | INTRAVENOUS | Status: DC
  Filled 2017-03-03: qty 4000

## 2017-03-03 MED ORDER — HEPARIN (PORCINE) IN NACL 100-0.45 UNIT/ML-% IJ SOLN
1500.0000 [IU]/h | INTRAMUSCULAR | Status: DC
  Administered 2017-03-03: 1250 [IU]/h via INTRAVENOUS
  Administered 2017-03-04 – 2017-03-05 (×2): 1500 [IU]/h via INTRAVENOUS
  Filled 2017-03-03 (×4): qty 250

## 2017-03-03 MED ORDER — FENTANYL CITRATE (PF) 100 MCG/2ML IJ SOLN
INTRAMUSCULAR | Status: DC | PRN
  Administered 2017-03-03: 25 ug via INTRAVENOUS

## 2017-03-03 MED ORDER — SODIUM CHLORIDE 0.9 % WEIGHT BASED INFUSION
1.0000 mL/kg/h | INTRAVENOUS | Status: DC
  Administered 2017-03-03: 1 mL/kg/h via INTRAVENOUS

## 2017-03-03 MED ORDER — SODIUM CHLORIDE 0.9% FLUSH
3.0000 mL | Freq: Two times a day (BID) | INTRAVENOUS | Status: DC
  Administered 2017-03-03 – 2017-03-04 (×3): 3 mL via INTRAVENOUS

## 2017-03-03 MED ORDER — ASPIRIN 81 MG PO CHEW
324.0000 mg | CHEWABLE_TABLET | Freq: Once | ORAL | Status: AC
  Administered 2017-03-03: 324 mg via ORAL
  Filled 2017-03-03: qty 4

## 2017-03-03 MED ORDER — SODIUM CHLORIDE 0.9% FLUSH
3.0000 mL | INTRAVENOUS | Status: DC | PRN
Start: 2017-03-03 — End: 2017-03-06

## 2017-03-03 MED ORDER — ASPIRIN EC 325 MG PO TBEC: 325.0000 mg | DELAYED_RELEASE_TABLET | Freq: Every day | ORAL | Status: DC

## 2017-03-03 MED ORDER — SODIUM CHLORIDE 0.9% FLUSH
3.0000 mL | Freq: Two times a day (BID) | INTRAVENOUS | Status: DC
  Administered 2017-03-04 – 2017-03-06 (×5): 3 mL via INTRAVENOUS

## 2017-03-03 MED ORDER — ACETAMINOPHEN 325 MG PO TABS: 650.0000 mg | ORAL_TABLET | ORAL | Status: DC | PRN

## 2017-03-03 MED ORDER — FENTANYL CITRATE (PF) 100 MCG/2ML IJ SOLN
INTRAMUSCULAR | Status: AC
  Filled 2017-03-03: qty 2

## 2017-03-03 MED ORDER — ATORVASTATIN CALCIUM 20 MG PO TABS
80.0000 mg | ORAL_TABLET | Freq: Every day | ORAL | Status: DC
Start: 2017-03-03 — End: 2017-03-06
  Administered 2017-03-03 – 2017-03-05 (×3): 80 mg via ORAL
  Filled 2017-03-03 (×3): qty 4

## 2017-03-03 MED ORDER — ONDANSETRON HCL 4 MG PO TABS: 4.0000 mg | ORAL_TABLET | Freq: Four times a day (QID) | ORAL | Status: DC | PRN

## 2017-03-03 MED ORDER — ONDANSETRON HCL 4 MG/2ML IJ SOLN: 4.0000 mg | Freq: Four times a day (QID) | INTRAMUSCULAR | Status: DC | PRN

## 2017-03-03 MED ORDER — MIDAZOLAM HCL 2 MG/2ML IJ SOLN
INTRAMUSCULAR | Status: AC
  Filled 2017-03-03: qty 2

## 2017-03-03 MED ORDER — MIDAZOLAM HCL 2 MG/2ML IJ SOLN
INTRAMUSCULAR | Status: DC | PRN
  Administered 2017-03-03: 0.5 mg via INTRAVENOUS

## 2017-03-03 MED ORDER — INSULIN ASPART 100 UNIT/ML ~~LOC~~ SOLN
0.0000 [IU] | Freq: Four times a day (QID) | SUBCUTANEOUS | Status: DC
Start: 2017-03-03 — End: 2017-03-04
  Administered 2017-03-03: 5 [IU] via SUBCUTANEOUS
  Administered 2017-03-03: 3 [IU] via SUBCUTANEOUS
  Administered 2017-03-03: 8 [IU] via SUBCUTANEOUS
  Administered 2017-03-04 (×2): 5 [IU] via SUBCUTANEOUS
  Filled 2017-03-03 (×5): qty 1

## 2017-03-03 MED ORDER — SODIUM CHLORIDE 0.9 % IV SOLN
INTRAVENOUS | Status: DC
  Administered 2017-03-03: 1000 mL via INTRAVENOUS

## 2017-03-03 MED ORDER — HEPARIN SODIUM (PORCINE) 5000 UNIT/ML IJ SOLN
4000.0000 [IU] | Freq: Once | INTRAMUSCULAR | Status: AC
  Administered 2017-03-03: 4000 [IU] via INTRAVENOUS
  Filled 2017-03-03: qty 1

## 2017-03-03 SURGICAL SUPPLY — 9 items
CATH INFINITI 5FR ANG PIGTAIL (CATHETERS) ×2 IMPLANT
CATH INFINITI 5FR JL4 (CATHETERS) ×2 IMPLANT
CATH INFINITI JR4 5F (CATHETERS) ×2 IMPLANT
DEVICE CLOSURE MYNXGRIP 5F (Vascular Products) ×2 IMPLANT
KIT MANI 3VAL PERCEP (MISCELLANEOUS) ×2 IMPLANT
NEEDLE PERC 18GX7CM (NEEDLE) ×2 IMPLANT
PACK CARDIAC CATH (CUSTOM PROCEDURE TRAY) ×2 IMPLANT
SHEATH AVANTI 5FR X 11CM (SHEATH) ×2 IMPLANT
WIRE GUIDERIGHT .035X150 (WIRE) ×2 IMPLANT

## 2017-03-03 NOTE — Progress Notes (Signed)
Patient troponin 6.69. No reports of chest pain at this time. Patient resting comfortably in bed. Hospitalist made aware. No new orders at this time. Will continue to monitor.   Mayra Neer M

## 2017-03-03 NOTE — H&P (Addendum)
Adam Douglas is an 67 y.o. male.   Chief Complaint: Chest pain HPI: The patient with past medical history of stroke, history of left bundle branch block, hypertension and diabetes presents to the emergency department with chest pain.  The pain began while at rest as the patient was lying in bed.  It was over his left chest and radiated across his upper chest bilaterally.  The pain did not last long but the patient became diaphoretic and acutely short of breath and remained so in the emergency department.  Oxygen saturations were barely 90% on 10 L of oxygen via facemask.  He was placed on BiPAP which improved his work of breathing.  Chest x-ray demonstrated vascular congestion and interstitial edema.  The patient was given Lasix and aspirin.  Laboratory evaluation revealed elevated troponin which prompted the emergency department staff to place him on therapeutic heparin for non-ST elevation myocardial ischemia.  The hospitalist service was called for further management.  Past Medical History:  Diagnosis Date  . Diabetes mellitus without complication ([place])   . Hypertension   . LBBB (left bundle branch block)    reported by patient as part of his medical history  . Stroke [facility])     Past Surgical History:  Procedure Laterality Date  . CERVICAL DISC SURGERY    . CHOLECYSTECTOMY      Family History  Problem Relation Age of Onset  . CAD Mother 54  . CAD Father 84   Social History:  reports that he has quit smoking. he has never used smokeless tobacco. He reports that he does not drink alcohol. His drug history is not on file.  Allergies: No Known Allergies  Medications Prior to Admission  Medication Sig Dispense Refill  . aspirin EC 325 MG tablet Take 325 mg by mouth daily.    Marland Kitchen atorvastatin (LIPITOR) 40 MG tablet Take 40 mg by mouth daily.    . citalopram (CELEXA) 40 MG tablet Take 40 mg by mouth daily.    . insulin aspart (NOVOLOG) 100 UNIT/ML injection Inject 15 Units into the skin 3  (three) times daily with meals.    . insulin NPH-regular Human (NOVOLIN 70/30) (70-30) 100 UNIT/ML injection Inject 20-50 Units into the skin 2 (two) times daily with a meal. 50units in the morning and 20 at night    . lisinopril-hydrochlorothiazide (PRINZIDE,ZESTORETIC) 20-12.5 MG tablet Take 1 tablet by mouth daily.    . metFORMIN (GLUCOPHAGE-XR) 500 MG 24 hr tablet Take 500 mg by mouth 2 (two) times daily.    Marland Kitchen omeprazole (PRILOSEC) 20 MG capsule Take 20 mg by mouth 2 (two) times daily before a meal.    . Polyethyl Glycol-Propyl Glycol (SYSTANE) 0.4-0.3 % SOLN Apply 1 drop to eye daily.    . traMADol (ULTRAM) 50 MG tablet Take 50 mg by mouth every 8 (eight) hours as needed for pain.    Marland Kitchen gentamicin (GARAMYCIN) 0.3 % ophthalmic ointment Place into the left eye 3 (three) times daily. (Patient not taking: Reported on 03/03/2017) 3.5 g 0    Results for orders placed or performed during the hospital encounter of 03/02/17 (from the past 48 hour(s))  CBC with Differential     Status: Abnormal   Collection Time: 03/02/17 11:14 PM  Result Value Ref Range   WBC 12.9 (H) 3.8 - 10.6 K/uL   RBC 4.42 4.40 - 5.90 MIL/uL   Hemoglobin 13.9 13.0 - 18.0 g/dL   HCT 41.6 40.0 - 52.0 %   MCV 94.1  80.0 - 100.0 fL   MCH 31.4 26.0 - 34.0 pg   MCHC 33.4 32.0 - 36.0 g/dL   RDW 13.8 11.5 - 14.5 %   Platelets 202 150 - 440 K/uL   Neutrophils Relative % 86 %   Neutro Abs 11.1 (H) 1.4 - 6.5 K/uL   Lymphocytes Relative 7 %   Lymphs Abs 0.9 (L) 1.0 - 3.6 K/uL   Monocytes Relative 6 %   Monocytes Absolute 0.7 0.2 - 1.0 K/uL   Eosinophils Relative 1 %   Eosinophils Absolute 0.1 0 - 0.7 K/uL   Basophils Relative 0 %   Basophils Absolute 0.0 0 - 0.1 K/uL    Comment: Performed at [facility], [address] [place], [place] [address]  Brain natriuretic peptide     Status: Abnormal   Collection Time: 03/02/17 11:14 PM  Result Value Ref Range   B Natriuretic Peptide 488.0 (H) 0.0 - 100.0 pg/mL     Comment: Performed at [facility], [address] [place], [place] [address]  Troponin I     Status: Abnormal   Collection Time: 03/02/17 11:14 PM  Result Value Ref Range   Troponin I 0.29 (HH) <0.03 ng/mL    Comment: CRITICAL RESULT CALLED TO, READ BACK BY AND VERIFIED WITH JENNIFER INGLESOL ON 03/03/17 AT 0031 JAG/SRC Performed at [facility], [address] [place], [place] [address]   Comprehensive metabolic panel     Status: Abnormal   Collection Time: 03/02/17 11:14 PM  Result Value Ref Range   Sodium 139 135 - 145 mmol/L   Potassium 4.2 3.5 - 5.1 mmol/L   Chloride 108 101 - 111 mmol/L   CO2 22 22 - 32 mmol/L   Glucose, Bld 321 (H) 65 - 99 mg/dL   BUN 19 6 - 20 mg/dL   Creatinine, Ser 1.17 0.61 - 1.24 mg/dL   Calcium 8.7 (L) 8.9 - 10.3 mg/dL   Total Protein 7.0 6.5 - 8.1 g/dL   Albumin 3.8 3.5 - 5.0 g/dL   AST 40 15 - 41 U/L   ALT 31 17 - 63 U/L   Alkaline Phosphatase 89 38 - 126 U/L   Total Bilirubin 0.7 0.3 - 1.2 mg/dL   GFR calc non Af Amer >60 >60 mL/min   GFR calc Af Amer >60 >60 mL/min    Comment: (NOTE) The eGFR has been calculated using the CKD EPI equation. This calculation has not been validated in all clinical situations. eGFR's persistently <60 mL/min signify possible Chronic Kidney Disease.    Anion gap 9 5 - 15    Comment: Performed at [facility], [address] [place], [place] [address]  Lipase, blood     Status: None   Collection Time: 03/02/17 11:14 PM  Result Value Ref Range   Lipase 30 11 - 51 U/L    Comment: Performed at [facility], [address] [place], [place] [address]  Blood gas, venous     Status: Abnormal   Collection Time: 03/02/17 11:14 PM  Result Value Ref Range   pH, Ven 7.42 7.250 - 7.430   pCO2, Ven 34 (L) 44.0 - 60.0 mmHg   pO2, Ven 60.0 (H) 32.0 - 45.0 mmHg   Bicarbonate 22.1 20.0 - 28.0 mmol/L   Acid-base deficit 1.7 0.0 - 2.0 mmol/L   O2 Saturation 91.1 %   Patient  temperature 37.0    Collection site VENOUS    Sample type VENOUS  Comment: Performed at [facility], [address] [place], [place] [address]  Lactic acid, plasma     Status: None   Collection Time: 03/02/17 11:14 PM  Result Value Ref Range   Lactic Acid, Venous 1.7 0.5 - 1.9 mmol/L    Comment: Performed at [facility], [address] [place], [place] [address]  Protime-INR     Status: None   Collection Time: 03/02/17 11:15 PM  Result Value Ref Range   Prothrombin Time 13.3 11.4 - 15.2 seconds   INR 1.02     Comment: Performed at [facility], [address] [place], [place] [address]  APTT     Status: None   Collection Time: 03/02/17 11:15 PM  Result Value Ref Range   aPTT 29 24 - 36 seconds    Comment: Performed at [facility], [address], [place], [place] [address]  Influenza panel by PCR (type A & B)     Status: None   Collection Time: 03/03/17 12:25 AM  Result Value Ref Range   Influenza A By PCR NEGATIVE NEGATIVE   Influenza B By PCR NEGATIVE NEGATIVE    Comment: (NOTE) The Xpert Xpress Flu assay is intended as an aid in the diagnosis of  influenza and should not be used as a sole basis for treatment.  This  assay is FDA approved for nasopharyngeal swab specimens only. Nasal  washings and aspirates are unacceptable for Xpert Xpress Flu testing. Performed at [facility], [address] [place], [place] [address]   Glucose, capillary     Status: Abnormal   Collection Time: 03/03/17  4:29 AM  Result Value Ref Range   Glucose-Capillary 346 (H) 65 - 99 mg/dL   Dg Chest Portable 1 View  Result Date: 03/02/2017 CLINICAL DATA:  Short of breath EXAM: PORTABLE CHEST 1 VIEW COMPARISON:  None. FINDINGS: Cardiomegaly with vascular congestion and diffuse interstitial opacities suspicious for pulmonary edema. No pleural effusion. No focal consolidation. No pneumothorax. IMPRESSION: Cardiomegaly with vascular congestion  and diffuse interstitial opacities suspicious for pulmonary edema Electronically Signed   By: Donavan Foil M.D.   On: 03/02/2017 23:32    Review of Systems  Constitutional: Positive for diaphoresis. Negative for chills and fever.  HENT: Negative for sore throat and tinnitus.   Eyes: Negative for blurred vision and redness.  Respiratory: Positive for shortness of breath. Negative for cough.   Cardiovascular: Positive for chest pain. Negative for palpitations, orthopnea and PND.  Gastrointestinal: Negative for abdominal pain, diarrhea, nausea and vomiting.  Genitourinary: Negative for dysuria, frequency and urgency.  Musculoskeletal: Negative for joint pain and myalgias.  Skin: Negative for rash.       No lesions  Neurological: Negative for speech change, focal weakness and weakness.  Endo/Heme/Allergies: Does not bruise/bleed easily.       No temperature intolerance  Psychiatric/Behavioral: Negative for depression and suicidal ideas.    Blood pressure 136/66, pulse 94, temperature 98.7 F (37.1 C), temperature source Oral, resp. rate 20, height 5' 8"  (1.727 m), weight 106.7 kg (235 lb 3.2 oz), SpO2 95 %. Physical Exam  Vitals reviewed. Constitutional: He is oriented to person, place, and time. He appears well-developed and well-nourished. No distress.  HENT:  Head: Normocephalic and atraumatic.  Mouth/Throat: Oropharynx is clear and moist.  Eyes: Conjunctivae and EOM are normal. Pupils are equal, round, and reactive to light. No scleral icterus.  Neck: Normal range of motion. Neck supple. No JVD present. No tracheal  deviation present. No thyromegaly present.  Cardiovascular: Normal rate, regular rhythm and normal heart sounds. Exam reveals no gallop and no friction rub.  No murmur heard. Respiratory: Effort normal and breath sounds normal. No respiratory distress.  GI: Soft. Bowel sounds are normal. He exhibits no distension. There is no tenderness.  Genitourinary:  Genitourinary  Comments: Deferred  Musculoskeletal: Normal range of motion. He exhibits no edema.  Lymphadenopathy:    He has no cervical adenopathy.  Neurological: He is alert and oriented to person, place, and time. No cranial nerve deficit.  Skin: Skin is warm and dry. No rash noted. No erythema.  Psychiatric: He has a normal mood and affect. His behavior is normal. Judgment and thought content normal.     Assessment/Plan This is a 67 year old male admitted for NSTEMI. 1.  NSTEMI: Continue heparin drip.  Heart rate and blood pressure decrease myocardial oxygen demand.  Echo ordered to evaluate left ventricular function. 2.  Acute respiratory failure with hypoxia: The patient has been weaned from BiPAP.  Continue mental oxygen via nasal cannula.  Wean as tolerated. 3.  Hypertension: Controlled; continue lisinopril and hydrochlorothiazide. 4.  Diabetes mellitus type 2: Continue basal insulin.  Sliding scale insulin while hospitalized as well. 5.  DVT prophylaxis: Pubic anticoagulation 6.  GI prophylaxis: Pantoprazole per home regimen The patient is a full code.  Time spent on admission orders and critical care approximately 45 minutes.  Discussed with E-link telemedicine  Harrie Foreman, MD 03/03/2017, 5:35 AM

## 2017-03-03 NOTE — ED Notes (Signed)
Patient had dose of ASA at home during morning (morning of 03/02/17). Verified with Dr. York Cerise that ASA should still be given. Per Dr. York Cerise, full dose of ASA given.

## 2017-03-03 NOTE — Progress Notes (Signed)
Pt's wife came out of room saying pt feels like he hears the gurgle sound in his lungs he had when he came in, this RN went to listen to pt's lungs upper lungs diminished & lower lungs with fine crackles O2 placed back on pt, and IV fluids stopped per Dr. Caryn Bee. Will continue to monitor. Shirley Friar, RN, BSN

## 2017-03-03 NOTE — Progress Notes (Signed)
67 year old male admitted with dx of NSTEMI.  Patient has a hx of DM, HTN, HLD, LBBB, Stroke.  Patient underwent cardiac cath today which revealed:     Physicians   Panel Physicians Referring Physician Case Authorizing Physician  Mariah Milling, Tollie Pizza, MD (Primary)  Creig Hines, NP  Procedures   LEFT HEART CATH AND CORONARY ANGIOGRAPHY  Conclusion     Ost 1st Mrg lesion is 80% stenosed.  Ost 2nd Mrg lesion is 85% stenosed.  Prox LAD lesion is 70% stenosed.  Ost 1st Diag lesion is 90% stenosed.  Mid LM lesion is 40% stenosed.  1st Mrg lesion is 90% stenosed.  Mid RCA lesion is 30% stenosed.  The left ventricular ejection fraction is less than 25% by visual estimate.  LV end diastolic pressure is mildly elevated.  There is severe left ventricular systolic dysfunction.  Post Atrio lesion is 50% stenosed.  There is no aortic valve stenosis.  There is no mitral valve stenosis.  Mid LAD lesion is 70% stenosed.    "Heart Attack Bouncing Back" booklet given and reviewed with patient and wife. Discussed the definition of CAD. Reviewed the location CAD.  ? Discussed modifiable risk factors including controlling blood pressure, cholesterol, and blood sugar; following heart healthy diet; maintaining healthy weight; exercise; and smoking cessation, if applicable. ?Note: Patient is a former smoker.  ? Discussed cardiac medications including rationale for taking, mechanisms of action, and side effects. Stressed the importance of taking medications as prescribed.  ? Discussed emergency plan for heart attack symptoms. Patient verbalized understanding of need to call 911 and not to drive herself to ER if having cardiac symptoms / chest pain.  ? Heart healthy diet of low sodium, low fat, low cholesterol heart healthy / carb modified diet discussed. Information on diet provided. Note:  Dietitian Consultation entered for diet education, as patient reported to this RN that his  diet is "terrible!" ? Smoking Cessation - Patient is a former smoker.   Exercise - Benefits of exercised discussed. Patient does not currently exercise.  Informed patient Dr. Mariah Milling has referred him to outpatient Cardiac Rehab. An overview of the program was provided. Patient is interested in participating.  The only barrier to participating in Cardiac Rehab is patient recently had an injection in his left knee.  Patient is reporting he has bone on bone, but is doing better since injection.   Brochure, informational letter, class and orientation times, and CPT billing codes given to patient / wife.   ? Patient and wife appreciative of the above information.  ? Army Melia, RN, BSN, [facility] Cardiovascular and Pulmonary Nurse Ronald Pippins

## 2017-03-03 NOTE — Consult Note (Signed)
Cardiology Consult    Patient ID: Adam Douglas MRN: [id number], DOB/AGE: 1950/01/09   Admit date: 03/02/2017 Date of Consult: 03/03/2017  Primary Physician: Delton Prairie, MD Primary Cardiologist: Julien Nordmann, MD Requesting Provider: Q. Imogene Burn, MD  Patient Profile    Adam Douglas is a 67 y.o. male with a history of HTN, HL, DMII, obesity, and prior ocular strokes, who is being seen today for the evaluation of NSTEMI at the request of Dr. Imogene Burn.  Past Medical History   Past Medical History:  Diagnosis Date  . Diabetes mellitus without complication ([facility])   . Hyperlipidemia   . Hypertension   . LBBB (left bundle branch block)    a. Noted 02/2017. Pt denies any known prior history of LBBB (not present on 2014 [facility] ECG interpretation).  . Morbid obesity ([facility])   . Stroke [facility])    a. Ocular strokes x 3 - prev eval in [facility] for first 2, [facility] for last one.    Past Surgical History:  Procedure Laterality Date  . CERVICAL DISC SURGERY    . CHOLECYSTECTOMY       Allergies  No Known Allergies  History of Present Illness    67 y/o ? with a h/o DMII (A1c 7.6 in 12/2016), HTN, HL, obesity, and prior ocular strokes.  No cardiac history that he is aware of.  He lives locally with his wife.  He does not routinely exercise but tries to stay active around his hous and in his shop.  He was in his USOH until the evening of 2/26, when he developed substernal chest discomfort associated with dyspnea, nausea, and diaphoresis.  He felt like he was 'breathing in water,' His wife called EMS and he was treated with duoneb and IV solumedrol.  He required NRB and was saturating @ 97% on arrival to the ED.  ED ECG showed sinus tachycardia w/ LBBB (ED note indicates that pt has a h/o LBBB but pt denies this this AM).  Initial troponin elevated @ 0.29.  He was placed on heparin and eventually became pain free. Overnight, trop has risen to 6.69.  Symptom free this AM.  Inpatient Medications    .  aspirin EC  81 mg Oral Daily  . atorvastatin  80 mg Oral Daily  . carvedilol  3.125 mg Oral BID WC  . citalopram  40 mg Oral Daily  . docusate sodium  100 mg Oral BID  . insulin aspart  0-15 Units Subcutaneous Q6H  . [START ON 03/04/2017] insulin glargine  30 Units Subcutaneous QHS  . lisinopril  20 mg Oral Daily  . pantoprazole  40 mg Oral Daily  . polyvinyl alcohol  1 drop Both Eyes Daily  . sodium chloride flush  3 mL Intravenous Q12H    Family History    Family History  Problem Relation Age of Onset  . CAD Mother 68  . Heart attack Mother   . CAD Father 74  . Heart attack Father   . CAD Sister    indicated that his mother is deceased. He indicated that his father is deceased. He indicated that his sister is deceased.   Social History    Social History   Socioeconomic History  . Marital status: Married    Spouse name: Not on file  . Number of children: Not on file  . Years of education: Not on file  . Highest education level: Not on file  Social Needs  . Financial resource strain: Not on file  .  Food insecurity - worry: Not on file  . Food insecurity - inability: Not on file  . Transportation needs - medical: Not on file  . Transportation needs - non-medical: Not on file  Occupational History  . Occupation: Retired    Comment: previously worked in Catering manager  . Smoking status: Former Smoker    Packs/day: 1.00    Years: 15.00    Pack years: 15.00  . Smokeless tobacco: Never Used  . Tobacco comment: Quit in his 53's.  Substance and Sexual Activity  . Alcohol use: No  . Drug use: No  . Sexual activity: Not on file  Other Topics Concern  . Not on file  Social History Narrative   Lives in [place] with his wife.  Children and 15 grandchildren nearby.  He does not routinely exercise.     Review of Systems    General:  +++ diaphoresis in the setting of c/p last night.  No chills, fever, or weight changes.  Cardiovascular:  +++ chest pain, +++  dyspnea, no edema, orthopnea, palpitations, paroxysmal nocturnal dyspnea. Dermatological: No rash, lesions/masses Respiratory: No cough, +++ dyspnea Urologic: No hematuria, dysuria Abdominal:   +++ nausea, no vomiting, diarrhea, bright red blood per rectum, melena, or hematemesis Neurologic:  No visual changes, wkns, changes in mental status. All other systems reviewed and are otherwise negative except as noted above.  Physical Exam    Blood pressure 132/68, pulse 90, temperature 98.1 F (36.7 C), temperature source Oral, resp. rate 18, height 5\' 8"  (1.727 m), weight 235 lb 3.2 oz (106.7 kg), SpO2 93 %.  General: Pleasant, NAD Psych: Normal affect. Neuro: Alert and oriented X 3. Moves all extremities spontaneously. HEENT: Normal  Neck: Supple without bruits or JVD. Lungs:  Resp regular and unlabored, CTA. Heart: RRR no s3, s4, or murmurs. Abdomen: Soft, non-tender, non-distended, BS + x 4.  Extremities: No clubbing, cyanosis or edema. DP/PT/Radials 2+ and equal bilaterally.  Labs     Recent Labs    03/02/17 2314 03/03/17 0524  TROPONINI 0.29* 6.69*   Lab Results  Component Value Date   WBC 11.9 (H) 03/03/2017   HGB 13.8 03/03/2017   HCT 40.7 03/03/2017   MCV 94.4 03/03/2017   PLT 207 03/03/2017    Recent Labs  Lab 03/02/17 2314  NA 139  K 4.2  CL 108  CO2 22  BUN 19  CREATININE 1.17  CALCIUM 8.7*  PROT 7.0  BILITOT 0.7  ALKPHOS 89  ALT 31  AST 40  GLUCOSE 321*    Radiology Studies    Dg Chest Portable 1 View  Result Date: 03/02/2017 CLINICAL DATA:  Short of breath EXAM: PORTABLE CHEST 1 VIEW COMPARISON:  None. FINDINGS: Cardiomegaly with vascular congestion and diffuse interstitial opacities suspicious for pulmonary edema. No pleural effusion. No focal consolidation. No pneumothorax. IMPRESSION: Cardiomegaly with vascular congestion and diffuse interstitial opacities suspicious for pulmonary edema Electronically Signed   By: Jasmine Pang M.D.   On:  03/02/2017 23:32    ECG & Cardiac Imaging    Sinus tachycardia, 116, LBBB  Assessment & Plan    1.  NSTEMI:  Pt w/o prior cardiac hx who presented to the ED last night with chest pain, sob, n, diaphoresis, and presumably new LBBB (pt unaware of prior h/o LBBB; [facility] ECG interpreatation in 2014 was nl).  Trop initially 0.29 but has since risen to 6.69.  Chest pain free this AM on heparin.  We discussed options for mgmt  and will plan on diagnostic catheterization this AM. The patient understands that risks include but are not limited to stroke (1 in 1000), death (1 in 1000), kidney failure [usually temporary] (1 in 500), bleeding (1 in 200), allergic reaction [possibly serious] (1 in 200), and agrees to proceed. I have added low-dose  blocker and titrated lipitor to 80 mg.  I will hold his HCTZ and hydrate this AM.  Cont ASA.  2.  Essential HTN:  Stable on lisinopril-HCTZ.  I'm going to hold HCTZ since he will be receiving contrast w/ cath this AM.  Adding  blocker in setting of NSTEMI.  3.  DM II:  A1c 7.6 in Dec.  Metformin on hold.  Cont SSI.  Gluc 321 this AM.  4.  HL:  On lipitor 40 @ home.  Will escalate to 80mg  in setting of ACS.  5.  Obesity:  Eventual cardiac rehab.  Signed, Nicolasa Ducking, NP 03/03/2017, 9:38 AM  For questions or updates, please contact   Please consult [email or web address] for contact info under Cardiology/STEMI.

## 2017-03-03 NOTE — ED Notes (Signed)
Patient had Bipap taken off to test if he could tolerate without. Patient's sats dropped to 91% on RA after taking off Bipap. Patient placed on 2L, states he doesn't feel short of breath. Sats remain at 94-97@ on 2L. Dr. Sheryle Hail aware and reassessed patient.

## 2017-03-03 NOTE — Progress Notes (Signed)
ANTICOAGULATION CONSULT NOTE - Initial Consult  Pharmacy Consult for heparin Indication: chest pain/ACS  No Known Allergies  Patient Measurements: Height: 5\' 8"  (172.7 cm) Weight: 235 lb 3.2 oz (106.7 kg) IBW/kg (Calculated) : 68.4 Heparin Dosing Weight: 100 kg  Vital Signs: Temp: 98.7 F (37.1 C) (02/27 0436) Temp Source: Oral (02/27 0436) BP: 136/66 (02/27 0436) Pulse Rate: 94 (02/27 0436)  Labs: Recent Labs    03/02/17 2314 03/02/17 2315 03/03/17 0524 03/03/17 0747  HGB 13.9  --  13.8  --   HCT 41.6  --  40.7  --   PLT 202  --  207  --   APTT  --  29  --   --   LABPROT  --  13.3  --   --   INR  --  1.02  --   --   HEPARINUNFRC  --   --   --  0.14*  CREATININE 1.17  --   --   --   TROPONINI 0.29*  --  6.69*  --     Estimated Creatinine Clearance: 72.5 mL/min (by C-G formula based on SCr of 1.17 mg/dL).   Medical History: Past Medical History:  Diagnosis Date  . Diabetes mellitus without complication ([facility])   . Hypertension   . LBBB (left bundle branch block)    reported by patient as part of his medical history  . Stroke [facility])     Medications:  Scheduled:  . aspirin EC  81 mg Oral Daily  . atorvastatin  80 mg Oral Daily  . carvedilol  3.125 mg Oral BID WC  . citalopram  40 mg Oral Daily  . docusate sodium  100 mg Oral BID  . insulin aspart  0-15 Units Subcutaneous Q6H  . [START ON 03/04/2017] insulin glargine  30 Units Subcutaneous QHS  . lisinopril  20 mg Oral Daily  . pantoprazole  40 mg Oral Daily  . polyvinyl alcohol  1 drop Both Eyes Daily    Assessment: Patient admitted w/ SOB w/ trops up to 0.29, EKG showing some runs of Vtach, no anticoagulation PTA. Patient is being started on heparin drip.  Goal of Therapy:  Heparin level 0.3-0.7 units/ml Monitor platelets by anticoagulation protocol: Yes   Plan:  Will bolus heparin 4000 units IV x 1 Will start drip @ 1100 units/hr Will draw HL @ 0700 Baseline labs drawn Will monitor daily CBC's  and adjust per HL's.  02/27 @ 0830: HL 0.14. Will bolus heparin 2700 units and increase infusion to 1400 units/hr. Will recheck HL in 6 hours.   Luisa Hart, PharmD Clinical Pharmacist  03/03/2017

## 2017-03-03 NOTE — ED Notes (Addendum)
Date and time results received: 03/03/17 0028   Test: Troponin Critical Value: 0.29  Name of Provider Notified: York Cerise  Orders Received? Or Actions Taken?: Acknowledged.

## 2017-03-03 NOTE — Progress Notes (Signed)
The patient was admitted for non-STEMI. He has no complaints of chest pain or shortness of breath. Vital signs are stable. Physical examinations is done. He was on heparin drip for non-STEMI. He got cardiac cath this morning which showed diffuse severe small vessel disease. Dr. Mariah Milling suggest keeping heparin drip and to troponin trending down, then change to Plavix, Lipitor 80 mg p.o. daily with aspirin, Coreg and ACE inhibitor for cardiomyopathy.  I discussed with the patient, his wife, RN, case Production designer, theatre/television/film and Dr. Mariah Milling.

## 2017-03-03 NOTE — ED Notes (Signed)
Awaiting Heparin drip from pharmacy.

## 2017-03-03 NOTE — Progress Notes (Signed)
Cardiac cath  Conclusions:  Moderate to severe mid and distal LAD disease severe disease of small diagonal vessels, Severe disease of small OM1 and om2 vessels  Recommendations:  Case discussed with Dr. Okey Dupre, Culprit vessel unclear given diffuse severe small vessel disease. Medical management recommended Continue heparin until troponin trending down, then will change to plavix lipitor 80 mg daily with asa, coreg/ace/arb for cardiomyopathy Cardiomyopathy appears out of proportion of his disease  Signed, [name], MD, Ph.D [facility]

## 2017-03-03 NOTE — ED Notes (Signed)
Heparin bolus discontinued d/t bolus being given earlier prior to infusion being ordered. Verified with Dr. York Cerise.

## 2017-03-03 NOTE — Progress Notes (Signed)
*  PRELIMINARY RESULTS* Echocardiogram 2D Echocardiogram has been performed.  Adam Douglas 03/03/2017, 3:53 PM

## 2017-03-03 NOTE — ED Notes (Signed)
Patient given water to drink. Sats dropped to 89% within a few minutes. Bipap placed back on patient.

## 2017-03-03 NOTE — Progress Notes (Signed)
ANTICOAGULATION CONSULT NOTE - Initial Consult  Pharmacy Consult for heparin Indication: chest pain/ACS  No Known Allergies  Patient Measurements: Weight: 222 lb (100.7 kg) Heparin Dosing Weight: 100 kg  Vital Signs: Temp: 98.4 F (36.9 C) (02/26 2320) Temp Source: Oral (02/26 2320) BP: 131/71 (02/26 2330) Pulse Rate: 107 (02/27 0015)  Labs: Recent Labs    03/02/17 2314 03/02/17 2315  HGB 13.9  --   HCT 41.6  --   PLT 202  --   APTT  --  29  LABPROT  --  13.3  INR  --  1.02  CREATININE 1.17  --   TROPONINI 0.29*  --     CrCl cannot be calculated (Unknown ideal weight.).   Medical History: Past Medical History:  Diagnosis Date  . Diabetes mellitus without complication (HCC)   . Hypertension   . LBBB (left bundle branch block)    reported by patient as part of his medical history  . Stroke [facility])     Medications:  Scheduled:  . aspirin  324 mg Oral Once  . heparin  4,000 Units Intravenous Once    Assessment: Patient admitted w/ SOB w/ trops up to 0.29, EKG showing some runs of Vtach, no anticoagulation PTA. Patient is being started on heparin drip.  Goal of Therapy:  Heparin level 0.3-0.7 units/ml Monitor platelets by anticoagulation protocol: Yes   Plan:  Will bolus heparin 4000 units IV x 1 Will start drip @ 1100 units/hr Will draw HL @ 0700 Baseline labs drawn Will monitor daily CBC's and adjust per HL's.  Thomasene Ripple, PharmD, BCPS Clinical Pharmacist 03/03/2017

## 2017-03-03 NOTE — Progress Notes (Signed)
ANTICOAGULATION CONSULT NOTE - Initial Consult  Pharmacy Consult for heparin Indication: chest pain/ACS  No Known Allergies  Patient Measurements: Height: 5\' 8"  (172.7 cm) Weight: 235 lb (106.6 kg) IBW/kg (Calculated) : 68.4 Heparin Dosing Weight: 100 kg  Vital Signs: Temp: 98 F (36.7 C) (02/27 1323) Temp Source: Oral (02/27 1323) BP: 128/66 (02/27 1323) Pulse Rate: 82 (02/27 1323)  Labs: Recent Labs    03/02/17 2314 03/02/17 2315 03/03/17 0524 03/03/17 0747 03/03/17 1448  HGB 13.9  --  13.8  --   --   HCT 41.6  --  40.7  --   --   PLT 202  --  207  --   --   APTT  --  29  --   --   --   LABPROT  --  13.3  --   --   --   INR  --  1.02  --   --   --   HEPARINUNFRC  --   --   --  0.14* <0.10*  CREATININE 1.17  --   --   --   --   TROPONINI 0.29*  --  6.69*  --  12.31*    Estimated Creatinine Clearance: 72.5 mL/min (by C-G formula based on SCr of 1.17 mg/dL).   Medical History: Past Medical History:  Diagnosis Date  . Diabetes mellitus without complication (HCC)   . Hyperlipidemia   . Hypertension   . LBBB (left bundle branch block)    a. Noted 02/2017. Pt denies any known prior history of LBBB (not present on 2014 [facility] ECG interpretation).  . Morbid obesity (HCC)   . Stroke [facility])    a. Ocular strokes x 3 - prev eval in [facility] for first 2, [facility] for last one.    Medications:  Scheduled:  . aspirin EC  81 mg Oral Daily  . atorvastatin  80 mg Oral Daily  . carvedilol  3.125 mg Oral BID WC  . citalopram  40 mg Oral Daily  . [START ON 03/04/2017] clopidogrel  75 mg Oral Q breakfast  . docusate sodium  100 mg Oral BID  . insulin aspart  0-15 Units Subcutaneous Q6H  . [START ON 03/04/2017] insulin glargine  30 Units Subcutaneous QHS  . lisinopril  20 mg Oral Daily  . pantoprazole  40 mg Oral Daily  . polyvinyl alcohol  1 drop Both Eyes Daily  . sodium chloride flush  3 mL Intravenous Q12H  . sodium chloride flush  3 mL Intravenous Q12H     Assessment: Patient admitted w/ SOB w/ trops up to 0.29, EKG showing some runs of Vtach, no anticoagulation PTA. Patient is being started on heparin drip.  Goal of Therapy:  Heparin level 0.3-0.7 units/ml Monitor platelets by anticoagulation protocol: Yes   Plan:  Will bolus heparin 4000 units IV x 1 Will start drip @ 1100 units/hr Will draw HL @ 0700 Baseline labs drawn Will monitor daily CBC's and adjust per HL's.  02/27 @ 0830: HL 0.14. Will bolus heparin 2700 units and increase infusion to 1400 units/hr. Will recheck HL in 6 hours.   02/28 @ 1600 s/p cath with sheath pulled at 1130. Cardiology wants to resume heparin drip until troponins trend down with infusion to begin at 2000. Will initiate infusion conservatively without bolus and with plans for slow titration to goal as per discussion with cardiology. HL/CBC 6 hours after infusion resumed.   Luisa Hart, PharmD Clinical Pharmacist  03/03/2017

## 2017-03-04 ENCOUNTER — Inpatient Hospital Stay: Payer: Medicare HMO

## 2017-03-04 DIAGNOSIS — I25118 Atherosclerotic heart disease of native coronary artery with other forms of angina pectoris: Secondary | ICD-10-CM

## 2017-03-04 DIAGNOSIS — R0602 Shortness of breath: Secondary | ICD-10-CM

## 2017-03-04 DIAGNOSIS — I420 Dilated cardiomyopathy: Secondary | ICD-10-CM

## 2017-03-04 LAB — LIPID PANEL
CHOL/HDL RATIO: 2.7 ratio
CHOLESTEROL: 94 mg/dL (ref 0–200)
HDL: 35 mg/dL — ABNORMAL LOW (ref >40)
LDL CALC: 36 mg/dL (ref 0–99)
TRIGLYCERIDES: 117 mg/dL (ref <150)
VLDL: 23 mg/dL (ref 0–40)

## 2017-03-04 LAB — BASIC METABOLIC PANEL
Anion gap: 8 (ref 5–15)
BUN: 25 mg/dL — ABNORMAL HIGH (ref 6–20)
CHLORIDE: 108 mmol/L (ref 101–111)
CO2: 22 mmol/L (ref 22–32)
Calcium: 8.3 mg/dL — ABNORMAL LOW (ref 8.9–10.3)
Creatinine, Ser: 1.22 mg/dL (ref 0.61–1.24)
GFR calc non Af Amer: 60 mL/min — ABNORMAL LOW (ref >60)
Glucose, Bld: 237 mg/dL — ABNORMAL HIGH (ref 65–99)
POTASSIUM: 4.2 mmol/L (ref 3.5–5.1)
Sodium: 138 mmol/L (ref 135–145)

## 2017-03-04 LAB — CBC
HCT: 37.4 % — ABNORMAL LOW (ref 40.0–52.0)
Hemoglobin: 12.8 g/dL — ABNORMAL LOW (ref 13.0–18.0)
MCH: 32.3 pg (ref 26.0–34.0)
MCHC: 34.2 g/dL (ref 32.0–36.0)
MCV: 94.5 fL (ref 80.0–100.0)
Platelets: 203 10*3/uL (ref 150–440)
RBC: 3.96 MIL/uL — ABNORMAL LOW (ref 4.40–5.90)
RDW: 14 % (ref 11.5–14.5)
WBC: 14.9 10*3/uL — ABNORMAL HIGH (ref 3.8–10.6)

## 2017-03-04 LAB — HEPARIN LEVEL (UNFRACTIONATED)
HEPARIN UNFRACTIONATED: 0.2 [IU]/mL — AB (ref 0.30–0.70)
Heparin Unfractionated: 0.2 IU/mL — ABNORMAL LOW (ref 0.30–0.70)
Heparin Unfractionated: 0.41 IU/mL (ref 0.30–0.70)
Heparin Unfractionated: 0.47 IU/mL (ref 0.30–0.70)

## 2017-03-04 LAB — GLUCOSE, CAPILLARY
Glucose-Capillary: 234 mg/dL — ABNORMAL HIGH (ref 65–99)
Glucose-Capillary: 219 mg/dL — ABNORMAL HIGH (ref 65–99)
Glucose-Capillary: 206 mg/dL — ABNORMAL HIGH (ref 65–99)
Glucose-Capillary: 196 mg/dL — ABNORMAL HIGH (ref 65–99)
Glucose-Capillary: 212 mg/dL — ABNORMAL HIGH (ref 65–99)

## 2017-03-04 LAB — TROPONIN I: Troponin I: 4.23 ng/mL (ref <0.03)

## 2017-03-04 MED ORDER — FUROSEMIDE 10 MG/ML IJ SOLN
40.0000 mg | Freq: Two times a day (BID) | INTRAMUSCULAR | Status: DC
  Administered 2017-03-04: 40 mg via INTRAVENOUS
  Filled 2017-03-04 (×2): qty 4

## 2017-03-04 MED ORDER — IPRATROPIUM-ALBUTEROL 0.5-2.5 (3) MG/3ML IN SOLN
3.0000 mL | Freq: Four times a day (QID) | RESPIRATORY_TRACT | Status: AC
  Administered 2017-03-04 (×4): 3 mL via RESPIRATORY_TRACT
  Filled 2017-03-04 (×4): qty 3

## 2017-03-04 MED ORDER — LOSARTAN POTASSIUM 50 MG PO TABS
50.0000 mg | ORAL_TABLET | Freq: Every day | ORAL | Status: DC
  Administered 2017-03-05: 50 mg via ORAL
  Filled 2017-03-04: qty 1

## 2017-03-04 MED ORDER — ISOSORBIDE MONONITRATE ER 30 MG PO TB24
30.0000 mg | ORAL_TABLET | Freq: Every day | ORAL | Status: DC
  Administered 2017-03-04 – 2017-03-05 (×2): 30 mg via ORAL
  Filled 2017-03-04 (×2): qty 1

## 2017-03-04 MED ORDER — INSULIN ASPART 100 UNIT/ML ~~LOC~~ SOLN
0.0000 [IU] | Freq: Three times a day (TID) | SUBCUTANEOUS | Status: DC
  Administered 2017-03-04: 3 [IU] via SUBCUTANEOUS
  Administered 2017-03-05: 5 [IU] via SUBCUTANEOUS
  Administered 2017-03-05: 12 [IU] via SUBCUTANEOUS
  Administered 2017-03-06: 3 [IU] via SUBCUTANEOUS
  Filled 2017-03-04 (×4): qty 1

## 2017-03-04 MED ORDER — HEPARIN BOLUS VIA INFUSION
1300.0000 [IU] | Freq: Once | INTRAVENOUS | Status: DC
  Filled 2017-03-04: qty 1300

## 2017-03-04 MED ORDER — INSULIN ASPART 100 UNIT/ML ~~LOC~~ SOLN
0.0000 [IU] | Freq: Every day | SUBCUTANEOUS | Status: DC
  Administered 2017-03-04: 2 [IU] via SUBCUTANEOUS
  Filled 2017-03-04: qty 1

## 2017-03-04 MED ORDER — PREMIER PROTEIN SHAKE
11.0000 [oz_av] | Freq: Two times a day (BID) | ORAL | Status: DC
  Administered 2017-03-04 – 2017-03-05 (×2): 11 [oz_av] via ORAL

## 2017-03-04 NOTE — Progress Notes (Addendum)
Progress Note  Patient Name: Adam Douglas Date of Encounter: 03/04/2017  Primary Cardiologist: Julien Nordmann, MD  Subjective   Developed dyspnea last night.  IVF stopped and placed on O2.  Worsened early this AM - assoc with nausea and diaphoresis.  No chest pain. F/u cxr this AM with vascular congestion.  Says he feels rough this AM.  No distress @ rest but continues to feel nauseated.  Inpatient Medications    Scheduled Meds: . aspirin EC  81 mg Oral Daily  . atorvastatin  80 mg Oral Daily  . carvedilol  3.125 mg Oral BID WC  . citalopram  40 mg Oral Daily  . clopidogrel  75 mg Oral Q breakfast  . docusate sodium  100 mg Oral BID  . insulin aspart  0-15 Units Subcutaneous Q6H  . insulin glargine  30 Units Subcutaneous QHS  . ipratropium-albuterol  3 mL Nebulization Q6H  . lisinopril  20 mg Oral Daily  . pantoprazole  40 mg Oral Daily  . polyvinyl alcohol  1 drop Both Eyes Daily  . sodium chloride flush  3 mL Intravenous Q12H  . sodium chloride flush  3 mL Intravenous Q12H   Continuous Infusions: . sodium chloride    . sodium chloride    . heparin 1,350 Units/hr (03/04/17 0420)   PRN Meds: sodium chloride, sodium chloride, acetaminophen **OR** acetaminophen, acetaminophen, ondansetron **OR** ondansetron (ZOFRAN) IV, ondansetron (ZOFRAN) IV, sodium chloride flush, sodium chloride flush, traMADol   Vital Signs    Vitals:   03/04/17 0500 03/04/17 0700 03/04/17 0926 03/04/17 0959  BP:   127/74   Pulse: (!) 108  (!) 118   Resp: (!) 25  18   Temp:   98.2 F (36.8 C) 99.1 F (37.3 C)  TempSrc:   Oral Oral  SpO2: 91%  95%   Weight:  236 lb 11.2 oz (107.4 kg)    Height:  5\' 8"  (1.727 m)      Intake/Output Summary (Last 24 hours) at 03/04/2017 1116 Last data filed at 03/04/2017 1046 Gross per 24 hour  Intake 960.41 ml  Output 400 ml  Net 560.41 ml   Filed Weights   03/03/17 0436 03/03/17 1012 03/04/17 0700  Weight: 235 lb 3.2 oz (106.7 kg) 235 lb (106.6 kg)  236 lb 11.2 oz (107.4 kg)    Physical Exam   GEN: Obese, in no acute distress.  HEENT: Grossly normal.  Neck: Supple, obese, difficult to gauge jvp. No carotid bruits, or masses. Cardiac: RRR, distant, no murmurs, rubs, or gallops. No clubbing, cyanosis, edema.  Radials/DP/PT 2+ and equal bilaterally. R groin w/o bleeding/bruit/hematoma. Respiratory:  Respirations regular and unlabored, bibasilar crackles. GI: firm, nontender, BS + x 4. MS: no deformity or atrophy. Skin: warm and dry, no rash. Neuro:  Strength and sensation are intact. Psych: AAOx3.  Flat affect.  Labs    Chemistry Recent Labs  Lab 03/02/17 2314  NA 139  K 4.2  CL 108  CO2 22  GLUCOSE 321*  BUN 19  CREATININE 1.17  CALCIUM 8.7*  PROT 7.0  ALBUMIN 3.8  AST 40  ALT 31  ALKPHOS 89  BILITOT 0.7  GFRNONAA >60  GFRAA >60  ANIONGAP 9     Hematology Recent Labs  Lab 03/02/17 2314 03/03/17 0524 03/04/17 0159  WBC 12.9* 11.9* 14.9*  RBC 4.42 4.31* 3.96*  HGB 13.9 13.8 12.8*  HCT 41.6 40.7 37.4*  MCV 94.1 94.4 94.5  MCH 31.4 32.1 32.3  MCHC 33.4  34.0 34.2  RDW 13.8 13.9 14.0  PLT 202 207 203    Cardiac Enzymes Recent Labs  Lab 03/02/17 2314 03/03/17 0524 03/03/17 1448 03/03/17 1730  TROPONINI 0.29* 6.69* 12.31* 11.12*    BNP Recent Labs  Lab 03/02/17 2314  BNP 488.0*     Radiology    Dg Chest Port 1 View  Result Date: 03/04/2017 CLINICAL DATA:  67 year old male with shortness of breath. EXAM: PORTABLE CHEST 1 VIEW COMPARISON:  Chest radiograph dated 03/02/2017 FINDINGS: There is moderate cardiomegaly with overall slight increase in the size of the cardiopericardial silhouette compared to the prior radiograph. There is vascular congestion similar to prior radiograph. No focal consolidation, pleural effusion, or pneumothorax. No acute osseous pathology. IMPRESSION: Moderate cardiomegaly with vascular congestion. No focal consolidation. Electronically Signed   By: Elgie Collard M.D.    On: 03/04/2017 05:27   Dg Chest Portable 1 View  Result Date: 03/02/2017 CLINICAL DATA:  Short of breath EXAM: PORTABLE CHEST 1 VIEW COMPARISON:  None. FINDINGS: Cardiomegaly with vascular congestion and diffuse interstitial opacities suspicious for pulmonary edema. No pleural effusion. No focal consolidation. No pneumothorax. IMPRESSION: Cardiomegaly with vascular congestion and diffuse interstitial opacities suspicious for pulmonary edema Electronically Signed   By: Jasmine Pang M.D.   On: 03/02/2017 23:32    Telemetry    RSR to Sinus tachy - Personally Reviewed  ECG    RSR,100, LBBB, LAD - Personally Reviewed  Cardiac Studies   Cardiac Catheterization 2.27.2019  Left mainstem: Large vessel that bifurcates into the LAD and left circumflex, 40% distal left main disease.  Left anterior descending (LAD): Large vessel that extends to the apical region, diagonal branch 2 of moderate size, 70% mid vessel, heavily calcified, diagonal #2 is a bifurcating small vessel, severely diseased ostial/proximal, 70% mid to distal LAD disease  Left circumflex (LCx): Large vessel with OM branch 2, ostial OM 1 has severe proximal disease, severe mid disease, mid to distal vessel is small. OM2 has severe ostial/proximal disease, 80 to 90%, moderate sized vessel  Right coronary artery (RCA): Right dominant vessel with PL and PDA, diffusly calcified, mild mid RCA disease, 50% PDA disease.  Left ventriculography: Left ventricular systolic function is severely depressed, EF<20%, mid inferior wall to apical region akinesis, global hypokinesis.  Final Conclusions:  Moderate to severe mid and distal LAD disease severe disease of small diagonal vessels, Severe disease of small OM1 and om2 vessels  Recommendations:  Case discussed with Dr. Okey Dupre, Culprit vessel unclear given diffuse severe small vessel disease. Medical management recommended Continue heparin until troponin trending down, then will change  to plavix lipitor 80 mg daily with asa, coreg/ace for cardiomyopathy Cardiomyopathy appears out of proportion of his disease  _____________   2D Echocardiogram 2.27.2019  Study Conclusions   - Left ventricle: The cavity size was moderately dilated. Systolic   function was severely reduced. The estimated ejection fraction   was in the range of 20% to 25%. Diffuse hypokinesis. Regional   wall motion abnormalities cannot be excluded. Features are   consistent with a pseudonormal left ventricular filling pattern,   with concomitant abnormal relaxation and increased filling   pressure (grade 2 diastolic dysfunction). - Mitral valve: There was mild regurgitation directed   eccentrically. - Left atrium: The atrium was normal in size. - Right ventricle: Systolic function was normal. - Pulmonary arteries: Systolic pressure was within the normal   range. _____________    Patient Profile     67 y.o. male with  a history of HTN, HL, DMII, obesity, and prior ocular strokes who was admitted 2/26 with chest pain, dyspnea, and NSTEMI. Cath revealed moderate, diffuse, small vessel dzs w/o clear culprit. EF 20-25%. Med Rx rec.  Assessment & Plan    1. NSTEMI/CAD: s/p cath 2/27 revealing moderate, diffuse, small vessel dzs.  Med Rx recommended.  He developed dyspnea overnight and is volume overloaded this AM.  CXR w/ vascular congestion.  Crackles on exam.  No chest pain.  I will f/u bmet and add lasix 40 IV bid.  Recycle troponins.  ECG w/ LBBB - similar to admission.  HR's generally 100 or more.  Cont asa, statin,  blocker.  Will transition acei to losartan with plan to transition to entresto on Saturday.  Adding oral nitrate given high suspicion for recurrent ishcemia.  2.  ICM/HFrEF: EF 20-25%. EDP @ time of cath.  Became more dyspneic overnight. Crackles on exam this AM.  Adding lasix 40 IV bid.  Cont coreg.  Transitioning from acei to ARB with plan to change to entresto on Saturday.  Will  prob add spiro tomorrow provided renal fxn/K stable.  3.  Essential HTN:  Stable.  4.  DM II: A1c 7.1.  Cont SSI.  5. HL:  Cont lipitor 80.  Fasting lipids ordered for AM.  Signed, Nicolasa Ducking, NP  03/04/2017, 11:16 AM    For questions or updates, please contact   Please consult [email or web address] for contact info under Cardiology/STEMI.  Attending Note Patient seen and examined, agree with detailed note above,  Patient presentation and plan discussed on rounds.   Episodes of shortness of breath with diaphoresis overnight, Some nausea Chest x-ray this morning with vascular congestion Significant beverages next to his bed and on the windowsill Wife brought them in, juices, sodas, water etc.  On physical exam unable to estimate JVP, lungs with crackles at the bases, heart sounds regular normal S1-S2 no murmurs appreciated, abdomen obese soft nontender no significant lower extremity edema  Last troponin 11.1 down last night  A/P: CAD, NSTEMI Diffuse coronary disease, medical management at this time High-dose statin aspirin Plavix  Cardiomyopathy, dilated Out of proportion to underlying coronary disease Ejection fraction 20% on echo Likely contributing to episodes of shortness of breath last night with diaphoresis --Agree with increasing Lasix up to 40 IV twice daily Long discussion concerning CHF management.  Numerous beverages next to his bedside and windowsill.  Wife brought them in Suggested drastic fluid restrictions, dietary changes Coreg, ARB, aldactone  Long discussion concerning events with last night with patient and family, discussed with chf team, CHF education provided Greater than 50% was spent in counseling and coordination of care with patient Total encounter time 35 minutes or more   Signed: [name]  M.D., Ph.D. [facility]

## 2017-03-04 NOTE — Progress Notes (Signed)
ANTICOAGULATION CONSULT NOTE - Initial Consult  Pharmacy Consult for heparin Indication: chest pain/ACS  No Known Allergies  Patient Measurements: Height: 5\' 8"  (172.7 cm) Weight: 235 lb (106.6 kg) IBW/kg (Calculated) : 68.4 Heparin Dosing Weight: 100 kg  Vital Signs: Temp: 97.6 F (36.4 C) (02/28 0418) Temp Source: Oral (02/28 0418) BP: 94/39 (02/28 0418) Pulse Rate: 116 (02/28 0418)  Labs: Recent Labs    03/02/17 2314 03/02/17 2315 03/03/17 0524 03/03/17 0747 03/03/17 1448 03/03/17 1730 03/04/17 0159  HGB 13.9  --  13.8  --   --   --  12.8*  HCT 41.6  --  40.7  --   --   --  37.4*  PLT 202  --  207  --   --   --  203  APTT  --  29  --   --   --   --   --   LABPROT  --  13.3  --   --   --   --   --   INR  --  1.02  --   --   --   --   --   HEPARINUNFRC  --   --   --  0.14* <0.10*  --  0.20*  CREATININE 1.17  --   --   --   --   --   --   TROPONINI 0.29*  --  6.69*  --  12.31* 11.12*  --     Estimated Creatinine Clearance: 72.5 mL/min (by C-G formula based on SCr of 1.17 mg/dL).   Medical History: Past Medical History:  Diagnosis Date  . Diabetes mellitus without complication (HCC)   . Hyperlipidemia   . Hypertension   . LBBB (left bundle branch block)    a. Noted 02/2017. Pt denies any known prior history of LBBB (not present on 2014 [facility] ECG interpretation).  . Morbid obesity (HCC)   . 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    Medications:  Scheduled:  . aspirin EC  81 mg Oral Daily  . atorvastatin  80 mg Oral Daily  . carvedilol  3.125 mg Oral BID WC  . citalopram  40 mg Oral Daily  . clopidogrel  75 mg Oral Q breakfast  . docusate sodium  100 mg Oral BID  . insulin aspart  0-15 Units Subcutaneous Q6H  . insulin glargine  30 Units Subcutaneous QHS  . lisinopril  20 mg Oral Daily  . pantoprazole  40 mg Oral Daily  . polyvinyl alcohol  1 drop Both Eyes Daily  . sodium chloride flush  3 mL  Intravenous Q12H  . sodium chloride flush  3 mL Intravenous Q12H    Assessment: Patient admitted w/ SOB w/ trops up to 0.29, EKG showing some runs of Vtach, no anticoagulation PTA. Patient is being started on heparin drip.  Goal of Therapy:  Heparin level 0.3-0.7 units/ml Monitor platelets by anticoagulation protocol: Yes   Plan:  Will bolus heparin 4000 units IV x 1 Will start drip @ 1100 units/hr Will draw HL @ 0700 Baseline labs drawn Will monitor daily CBC's and adjust per HL's.  02/27 @ 0830: HL 0.14. Will bolus heparin 2700 units and increase infusion to 1400 units/hr. Will recheck HL in 6 hours.   02/28 @ 1600 s/p cath with sheath pulled at 1130. Cardiology wants to resume heparin drip until troponins trend down  with infusion to begin at 2000. Will initiate infusion conservatively without bolus and with plans for slow titration to goal as per discussion with cardiology. HL/CBC 6 hours after infusion resumed.   02/28 @ 0200 HL 0.20 subtherapeutic. Will increase rate without bolus to 1350 units/hr and will recheck HL @ 1000. CBC trending down.  Thomasene Ripple, PharmD, BCPS Clinical Pharmacist 03/04/2017

## 2017-03-04 NOTE — Progress Notes (Signed)
Nutrition Education Note  RD consulted for nutrition education regarding new onset CHF and DM.  RD provided "Heart Healthy Nutrition" handout from the Academy of [employer]. Reviewed patient's dietary recall. Provided examples on ways to decrease sodium intake in diet. Discouraged intake of processed foods and use of salt shaker. Encouraged fresh fruits and vegetables as well as whole grain sources of carbohydrates to maximize fiber intake.   RD discussed why it is important for patient to adhere to diet recommendations, and emphasized the role of fluids, foods to avoid, and importance of weighing self daily.   RD also provided "Nutrition with Type II Diabetes" handout from the Academy of [employer]etics. Discussed different food groups and their effects on blood sugar, emphasizing carbohydrate-containing foods. Provided list of carbohydrates and recommended serving sizes of common foods.  Discussed importance of controlled and consistent carbohydrate intake throughout the day. Provided examples of ways to balance meals/snacks and encouraged intake of high-fiber, whole grain complex carbohydrates.   Teach back method used.  Expect fair compliance.  Body mass index is 34.18 kg/m. Pt meets criteria for obesity based on current BMI.  Current diet order is regular, patient is consuming approximately 75-100% of meals at this time. Spoke to patient. Pt reports good appetite and oral intake at baseline. Pt reports eating well since admit except for breakfast this morning when he did not eat well because he was nauseas. Pt ate 75% of his lunch. RD will add Premier Protein to help pt meet his estimated protein needs. Pt reports stable weight pta.    Labs and medications reviewed. No further nutrition interventions warranted at this time. RD contact information provided. If additional nutrition issues arise, please re-consult RD.   Betsey Holiday MS, RD, LDN Pager #-  [phone] After Hours Pager: [phone]

## 2017-03-04 NOTE — Progress Notes (Signed)
[employer] - [place] at [facility]   PATIENT NAME: Adam Douglas    MR#:  [id number]  DATE OF BIRTH:  05-Nov-1950  SUBJECTIVE:  CHIEF COMPLAINT:   Chief Complaint  Patient presents with  . Shortness of Breath   Had nausea, shortness of breath earlier.  On oxygen.  No chest pain.  REVIEW OF SYSTEMS:    Review of Systems  Constitutional: Positive for malaise/fatigue. Negative for chills and fever.  HENT: Negative for sore throat.   Eyes: Negative for blurred vision, double vision and pain.  Respiratory: Negative for cough, hemoptysis, shortness of breath and wheezing.   Cardiovascular: Negative for chest pain, palpitations, orthopnea and leg swelling.  Gastrointestinal: Negative for abdominal pain, constipation, diarrhea, heartburn, nausea and vomiting.  Genitourinary: Negative for dysuria and hematuria.  Musculoskeletal: Negative for back pain and joint pain.  Skin: Negative for rash.  Neurological: Positive for weakness. Negative for sensory change, speech change, focal weakness and headaches.  Endo/Heme/Allergies: Does not bruise/bleed easily.  Psychiatric/Behavioral: Negative for depression. The patient is not nervous/anxious.     DRUG ALLERGIES:  No Known Allergies  VITALS:  Blood pressure (!) 118/53, pulse 93, temperature 99.1 F (37.3 C), temperature source Oral, resp. rate 16, height 5\' 8"  (1.727 m), weight 107.4 kg (236 lb 11.2 oz), SpO2 96 %.  PHYSICAL EXAMINATION:   Physical Exam  GENERAL:  67 y.o.-year-old patient lying in the bed with no acute distress.  EYES: Pupils equal, round, reactive to light and accommodation. No scleral icterus. Extraocular muscles intact.  HEENT: Head atraumatic, normocephalic. Oropharynx and nasopharynx clear.  NECK:  Supple, no jugular venous distention. No thyroid enlargement, no tenderness.  LUNGS: Bilateral crackles CARDIOVASCULAR: S1, S2 normal. No murmurs, rubs, or gallops.  ABDOMEN: Soft, nontender,  nondistended. Bowel sounds present. No organomegaly or mass.  EXTREMITIES: No cyanosis, clubbing or edema b/l.    NEUROLOGIC: Cranial nerves II through XII are intact. No focal Motor or sensory deficits b/l.   PSYCHIATRIC: The patient is alert and oriented x 3.  SKIN: No obvious rash, lesion, or ulcer.   LABORATORY PANEL:   CBC Recent Labs  Lab 03/04/17 0159  WBC 14.9*  HGB 12.8*  HCT 37.4*  PLT 203   ------------------------------------------------------------------------------------------------------------------ Chemistries  Recent Labs  Lab 03/02/17 2314 03/04/17 1134  NA 139 138  K 4.2 4.2  CL 108 108  CO2 22 22  GLUCOSE 321* 237*  BUN 19 25*  CREATININE 1.17 1.22  CALCIUM 8.7* 8.3*  AST 40  --   ALT 31  --   ALKPHOS 89  --   BILITOT 0.7  --    ------------------------------------------------------------------------------------------------------------------  Cardiac Enzymes Recent Labs  Lab 03/04/17 1134  TROPONINI 4.23*   ------------------------------------------------------------------------------------------------------------------  RADIOLOGY:  Dg Chest Port 1 View  Result Date: 03/04/2017 CLINICAL DATA:  67 year old male with shortness of breath. EXAM: PORTABLE CHEST 1 VIEW COMPARISON:  Chest radiograph dated 03/02/2017 FINDINGS: There is moderate cardiomegaly with overall slight increase in the size of the cardiopericardial silhouette compared to the prior radiograph. There is vascular congestion similar to prior radiograph. No focal consolidation, pleural effusion, or pneumothorax. No acute osseous pathology. IMPRESSION: Moderate cardiomegaly with vascular congestion. No focal consolidation. Electronically Signed   By: Elgie Collard M.D.   On: 03/04/2017 05:27   Dg Chest Portable 1 View  Result Date: 03/02/2017 CLINICAL DATA:  Short of breath EXAM: PORTABLE CHEST 1 VIEW COMPARISON:  None. FINDINGS: Cardiomegaly with vascular congestion and diffuse  interstitial opacities  suspicious for pulmonary edema. No pleural effusion. No focal consolidation. No pneumothorax. IMPRESSION: Cardiomegaly with vascular congestion and diffuse interstitial opacities suspicious for pulmonary edema Electronically Signed   By: Jasmine Pang M.D.   On: 03/02/2017 23:32     ASSESSMENT AND PLAN:   *Acute on chronic systolic congestive heart failure.  Ejection fraction 20-25%. Start IV Lasix. On Coreg and Cozaar, Imdur  *Non-ST elevation MI.  Cardiac catheterization with diffuse disease.  Medical management advised. On aspirin and Plavix  *Hypertension.  Continue Coreg, Cozaar and Imdur.  *Acute hypoxic respiratory failure secondary to pulmonary edema from systolic CHF.  Wean oxygen as tolerated.  All the records are reviewed and case discussed with Care Management/Social Worker Management plans discussed with the patient, family and they are in agreement.  CODE STATUS: FULL CODE  DVT Prophylaxis: SCDs  TOTAL TIME TAKING CARE OF THIS PATIENT: 30 minutes.   POSSIBLE D/C IN 2-3 DAYS, DEPENDING ON CLINICAL CONDITION.  Molinda Bailiff Antoino Westhoff M.D on 03/04/2017 at 1:09 PM  Between 7am to 6pm - Pager - [phone]  After 6pm go to [email or web address] - password [email or web address] [facility]  [facility] [place] [facility]  Office  [phone]  CC: Primary care physician; Delton Prairie, MD  Note: This dictation was prepared with Dragon dictation along with smaller phrase technology. Any transcriptional errors that result from this process are unintentional.

## 2017-03-04 NOTE — Progress Notes (Signed)
Pt called this RN in room stating he was short of breath and hearing gurgle noises in his breathing again. When this RN got into room pt was breathing very shallow and wheezing. 95% on 2L O2 though. MD paged, and explained what was going on, Dr. Katheren Shams put in orders for dounebs q6hr x 4 doses & a chest xray.  Shirley Friar, RN, BSN

## 2017-03-04 NOTE — Progress Notes (Signed)
ANTICOAGULATION CONSULT NOTE -  Pharmacy Consult for heparin Indication: chest pain/ACS  No Known Allergies  Patient Measurements: Height: 5\' 8"  (172.7 cm) Weight: 236 lb 11.2 oz (107.4 kg) IBW/kg (Calculated) : 68.4 Heparin Dosing Weight: 92 kg  Vital Signs: Temp: 99.1 F (37.3 C) (02/28 0959) Temp Source: Oral (02/28 0959) BP: 127/74 (02/28 0926) Pulse Rate: 118 (02/28 0926)  Labs: Recent Labs    03/02/17 2314 03/02/17 2315 03/03/17 0524  03/03/17 1448 03/03/17 1730 03/04/17 0159 03/04/17 1006  HGB 13.9  --  13.8  --   --   --  12.8*  --   HCT 41.6  --  40.7  --   --   --  37.4*  --   PLT 202  --  207  --   --   --  203  --   APTT  --  29  --   --   --   --   --   --   LABPROT  --  13.3  --   --   --   --   --   --   INR  --  1.02  --   --   --   --   --   --   HEPARINUNFRC  --   --   --    < > <0.10*  --  0.20* 0.20*  CREATININE 1.17  --   --   --   --   --   --   --   TROPONINI 0.29*  --  6.69*  --  12.31* 11.12*  --   --    < > = values in this interval not displayed.    Estimated Creatinine Clearance: 72.8 mL/min (by C-G formula based on SCr of 1.17 mg/dL).   Medical History: Past Medical History:  Diagnosis Date  . Diabetes mellitus without complication (HCC)   . Hyperlipidemia   . Hypertension   . LBBB (left bundle branch block)    a. Noted 02/2017. Pt denies any known prior history of LBBB (not present on 2014 [facility] ECG interpretation).  . Morbid obesity (HCC)   . 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    Medications:  Scheduled:  . aspirin EC  81 mg Oral Daily  . atorvastatin  80 mg Oral Daily  . carvedilol  3.125 mg Oral BID WC  . citalopram  40 mg Oral Daily  . clopidogrel  75 mg Oral Q breakfast  . docusate sodium  100 mg Oral BID  . heparin  1,300 Units Intravenous Once  . insulin aspart  0-15 Units Subcutaneous Q6H  . insulin glargine  30 Units Subcutaneous QHS  .  ipratropium-albuterol  3 mL Nebulization Q6H  . lisinopril  20 mg Oral Daily  . pantoprazole  40 mg Oral Daily  . polyvinyl alcohol  1 drop Both Eyes Daily  . sodium chloride flush  3 mL Intravenous Q12H  . sodium chloride flush  3 mL Intravenous Q12H    Assessment: Patient admitted w/ SOB w/ trops up to 0.29, EKG showing some runs of Vtach, no anticoagulation PTA. Patient is being started on heparin drip.  Goal of Therapy:  Heparin level 0.3-0.7 units/ml Monitor platelets by anticoagulation protocol: Yes   Plan:  Will bolus heparin 4000 units IV x 1 Will start drip @ 1100 units/hr Will draw HL @ 0700  Baseline labs drawn Will monitor daily CBC's and adjust per HL's.  02/27 @ 0830: HL 0.14. Will bolus heparin 2700 units and increase infusion to 1400 units/hr. Will recheck HL in 6 hours.   02/28 @ 1600 s/p cath with sheath pulled at 1130. Cardiology wants to resume heparin drip until troponins trend down with infusion to begin at 2000. Will initiate infusion conservatively without bolus and with plans for slow titration to goal as per discussion with cardiology. HL/CBC 6 hours after infusion resumed.   02/28 @ 0200 HL 0.20 subtherapeutic. Will increase rate without bolus to 1350 units/hr and will recheck HL @ 1000. CBC trending down.  02/28 HL @ 1006 = 0.20. Will increase drip rate to 1500 units/hr as conservative dosing per note above. F/u HL in 6 hours.  Bari Mantis PharmD Clinical Pharmacist 03/04/2017

## 2017-03-04 NOTE — Progress Notes (Signed)
Breathing treatment was given by RT, she does not think pt benefited from treatment and thinks he needs to go on the bipap for a little bit, because this is what they did when he came into the ED, pt states he was on the bipap for maybe an hour and was able to come off. Dr. Katheren Shams paged, he gave okay to place order for bipap at night now. Will continue to monitor. Shirley Friar, RN, BSN

## 2017-03-04 NOTE — Progress Notes (Signed)
ANTICOAGULATION CONSULT NOTE -  Pharmacy Consult for heparin Indication: chest pain/ACS  No Known Allergies  Patient Measurements: Height: 5\' 8"  (172.7 cm) Weight: 236 lb 11.2 oz (107.4 kg) IBW/kg (Calculated) : 68.4 Heparin Dosing Weight: 92 kg  Vital Signs: Temp: 99.1 F (37.3 C) (02/28 0959) Temp Source: Oral (02/28 0959) BP: 102/59 (02/28 1737) Pulse Rate: 84 (02/28 1737)  Labs: Recent Labs    03/02/17 2314 03/02/17 2315 03/03/17 0524  03/03/17 1448 03/03/17 1730 03/04/17 0159 03/04/17 1006 03/04/17 1134 03/04/17 1735  HGB 13.9  --  13.8  --   --   --  12.8*  --   --   --   HCT 41.6  --  40.7  --   --   --  37.4*  --   --   --   PLT 202  --  207  --   --   --  203  --   --   --   APTT  --  29  --   --   --   --   --   --   --   --   LABPROT  --  13.3  --   --   --   --   --   --   --   --   INR  --  1.02  --   --   --   --   --   --   --   --   HEPARINUNFRC  --   --   --    < > <0.10*  --  0.20* 0.20*  --  0.41  CREATININE 1.17  --   --   --   --   --   --   --  1.22  --   TROPONINI 0.29*  --  6.69*  --  12.31* 11.12*  --   --  4.23*  --    < > = values in this interval not displayed.    Estimated Creatinine Clearance: 69.8 mL/min (by C-G formula based on SCr of 1.22 mg/dL).   Medical History: Past Medical History:  Diagnosis Date  . Diabetes mellitus without complication (HCC)   . Hyperlipidemia   . Hypertension   . LBBB (left bundle branch block)    a. Noted 02/2017. Pt denies any known prior history of LBBB (not present on 2014 [facility] ECG interpretation).  . Morbid obesity (HCC)   . 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    Medications:  Scheduled:  . aspirin EC  81 mg Oral Daily  . atorvastatin  80 mg Oral Daily  . carvedilol  3.125 mg Oral BID WC  . citalopram  40 mg Oral Daily  . clopidogrel  75 mg Oral Q breakfast  . docusate sodium  100 mg Oral BID  . furosemide  40 mg Intravenous  BID  . insulin aspart  0-15 Units Subcutaneous TID WC  . insulin aspart  0-5 Units Subcutaneous QHS  . insulin glargine  30 Units Subcutaneous QHS  . ipratropium-albuterol  3 mL Nebulization Q6H  . isosorbide mononitrate  30 mg Oral Daily  . [START ON 03/05/2017] losartan  50 mg Oral Daily  . pantoprazole  40 mg Oral Daily  . polyvinyl alcohol  1 drop Both Eyes Daily  . protein supplement shake  11 oz Oral BID BM  .  sodium chloride flush  3 mL Intravenous Q12H  . sodium chloride flush  3 mL Intravenous Q12H    Assessment: Patient admitted w/ SOB w/ trops up to 0.29, EKG showing some runs of Vtach, no anticoagulation PTA. Patient is being started on heparin drip.  Goal of Therapy:  Heparin level 0.3-0.7 units/ml Monitor platelets by anticoagulation protocol: Yes   Plan:  Will bolus heparin 4000 units IV x 1 Will start drip @ 1100 units/hr Will draw HL @ 0700 Baseline labs drawn Will monitor daily CBC's and adjust per HL's.  02/27 @ 0830: HL 0.14. Will bolus heparin 2700 units and increase infusion to 1400 units/hr. Will recheck HL in 6 hours.   02/28 @ 1600 s/p cath with sheath pulled at 1130. Cardiology wants to resume heparin drip until troponins trend down with infusion to begin at 2000. Will initiate infusion conservatively without bolus and with plans for slow titration to goal as per discussion with cardiology. HL/CBC 6 hours after infusion resumed.   02/28 @ 0200 HL 0.20 subtherapeutic. Will increase rate without bolus to 1350 units/hr and will recheck HL @ 1000. CBC trending down.  02/28 HL @ 1006 = 0.20. Will increase drip rate to 1500 units/hr as conservative dosing per note above. F/u HL in 6 hours.  2/28 HL @ 1735 = 0.41 is therapeutic. Continue heparin infusion at current rate of 1500 units/hr and order confirmatory HL in 6 hours.  Cindi Carbon PharmD Clinical Pharmacist 03/04/2017

## 2017-03-04 NOTE — Progress Notes (Signed)
Inpatient Diabetes Program Recommendations  AACE/ADA: New Consensus Statement on Inpatient Glycemic Control (2015)  Target Ranges:  Prepandial:   less than 140 mg/dL      Peak postprandial:   less than 180 mg/dL (1-2 hours)      Critically ill patients:  140 - 180 mg/dL   Lab Results  Component Value Date   GLUCAP 219 (H) 03/04/2017   HGBA1C 7.1 (H) 03/03/2017    Review of Glycemic Control  Results for Adam, Douglas (MRN [id number]) as of 03/04/2017 11:48  Ref. Range 03/03/2017 10:12 03/03/2017 13:22 03/03/2017 17:01 03/03/2017 23:16 03/04/2017 06:49  Glucose-Capillary Latest Ref Range: 65 - 99 mg/dL 485 (H) 462 (H) 703 (H) 163 (H) 219 (H)    Diabetes history: Type 2 Outpatient Diabetes medications: Novolog 15 units tid, Novolog 70/30 50 units qam, 20 units q evening, Glucophage 500 mg bid  Current orders for Inpatient glycemic control: Lantus 30 units qhs, Novolog 0-15 units q6h  Inpatient Diabetes Program Recommendations:  Now that the patient is ordered a diet, please change Novolog 0-15 units to tid and add Novolog 0-5 units qhs.  Consider adding Novolog 3 units tid if the patient eats more than 50%.  Adam Racer, RN, BA, MHA, CDE Diabetes Coordinator Inpatient Diabetes Program  [phone] (Team Pager) [phone] [facility] Office) 03/04/2017 11:52 AM

## 2017-03-05 ENCOUNTER — Encounter: Payer: Self-pay | Admitting: Nurse Practitioner

## 2017-03-05 LAB — GLUCOSE, CAPILLARY
Glucose-Capillary: 176 mg/dL — ABNORMAL HIGH (ref 65–99)
Glucose-Capillary: 283 mg/dL — ABNORMAL HIGH (ref 65–99)
Glucose-Capillary: 209 mg/dL — ABNORMAL HIGH (ref 65–99)
Glucose-Capillary: 194 mg/dL — ABNORMAL HIGH (ref 65–99)

## 2017-03-05 LAB — BASIC METABOLIC PANEL
ANION GAP: 6 (ref 5–15)
BUN: 34 mg/dL — ABNORMAL HIGH (ref 6–20)
CHLORIDE: 107 mmol/L (ref 101–111)
CO2: 24 mmol/L (ref 22–32)
Calcium: 8.1 mg/dL — ABNORMAL LOW (ref 8.9–10.3)
Creatinine, Ser: 1.47 mg/dL — ABNORMAL HIGH (ref 0.61–1.24)
GFR, EST AFRICAN AMERICAN: 55 mL/min — AB (ref >60)
GFR, EST NON AFRICAN AMERICAN: 48 mL/min — AB (ref >60)
Glucose, Bld: 176 mg/dL — ABNORMAL HIGH (ref 65–99)
POTASSIUM: 4 mmol/L (ref 3.5–5.1)
SODIUM: 137 mmol/L (ref 135–145)

## 2017-03-05 LAB — HEPARIN LEVEL (UNFRACTIONATED): HEPARIN UNFRACTIONATED: 0.56 [IU]/mL (ref 0.30–0.70)

## 2017-03-05 LAB — CBC
HCT: 33.9 % — ABNORMAL LOW (ref 40.0–52.0)
Hemoglobin: 11.5 g/dL — ABNORMAL LOW (ref 13.0–18.0)
MCH: 32.2 pg (ref 26.0–34.0)
MCHC: 33.9 g/dL (ref 32.0–36.0)
MCV: 95 fL (ref 80.0–100.0)
Platelets: 169 10*3/uL (ref 150–440)
RBC: 3.57 MIL/uL — ABNORMAL LOW (ref 4.40–5.90)
RDW: 14 % (ref 11.5–14.5)
WBC: 9.3 10*3/uL (ref 3.8–10.6)

## 2017-03-05 MED ORDER — ISOSORBIDE MONONITRATE ER 30 MG PO TB24
15.0000 mg | ORAL_TABLET | Freq: Every day | ORAL | Status: DC
  Administered 2017-03-06: 15 mg via ORAL
  Filled 2017-03-05: qty 1

## 2017-03-05 MED ORDER — LOSARTAN POTASSIUM 25 MG PO TABS
25.0000 mg | ORAL_TABLET | Freq: Every day | ORAL | Status: DC
  Administered 2017-03-06: 25 mg via ORAL
  Filled 2017-03-05: qty 1

## 2017-03-05 NOTE — Progress Notes (Signed)
ANTICOAGULATION CONSULT NOTE -  Pharmacy Consult for heparin Indication: chest pain/ACS  No Known Allergies  Patient Measurements: Height: 5\' 8"  (172.7 cm) Weight: 236 lb 11.2 oz (107.4 kg) IBW/kg (Calculated) : 68.4 Heparin Dosing Weight: 92 kg  Vital Signs: Temp: 97.7 F (36.5 C) (02/28 1955) Temp Source: Oral (02/28 1955) BP: 97/48 (02/28 2021) Pulse Rate: 86 (02/28 2021)  Labs: Recent Labs    03/02/17 2314 03/02/17 2315 03/03/17 0524  03/03/17 1448 03/03/17 1730 03/04/17 0159  03/04/17 1134 03/04/17 1735 03/04/17 2015 03/05/17 0235  HGB 13.9  --  13.8  --   --   --  12.8*  --   --   --   --  11.5*  HCT 41.6  --  40.7  --   --   --  37.4*  --   --   --   --  33.9*  PLT 202  --  207  --   --   --  203  --   --   --   --  169  APTT  --  29  --   --   --   --   --   --   --   --   --   --   LABPROT  --  13.3  --   --   --   --   --   --   --   --   --   --   INR  --  1.02  --   --   --   --   --   --   --   --   --   --   HEPARINUNFRC  --   --   --    < > <0.10*  --  0.20*   < >  --  0.41 0.47 0.56  CREATININE 1.17  --   --   --   --   --   --   --  1.22  --   --  1.47*  TROPONINI 0.29*  --  6.69*  --  12.31* 11.12*  --   --  4.23*  --   --   --    < > = values in this interval not displayed.    Estimated Creatinine Clearance: 57.9 mL/min (A) (by C-G formula based on SCr of 1.47 mg/dL (H)).   Medical History: Past Medical History:  Diagnosis Date  . Diabetes mellitus without complication (HCC)   . Hyperlipidemia   . Hypertension   . LBBB (left bundle branch block)    a. Noted 02/2017. Pt denies any known prior history of LBBB (not present on 2014 [facility] ECG interpretation).  . Morbid obesity (HCC)   . 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    Medications:  Scheduled:  . aspirin EC  81 mg Oral Daily  . atorvastatin  80 mg Oral Daily  . carvedilol  3.125 mg Oral BID WC  . citalopram  40 mg Oral  Daily  . clopidogrel  75 mg Oral Q breakfast  . docusate sodium  100 mg Oral BID  . furosemide  40 mg Intravenous BID  . insulin aspart  0-15 Units Subcutaneous TID WC  . insulin aspart  0-5 Units Subcutaneous QHS  . insulin glargine  30 Units Subcutaneous QHS  . isosorbide mononitrate  30 mg Oral Daily  .  losartan  50 mg Oral Daily  . pantoprazole  40 mg Oral Daily  . polyvinyl alcohol  1 drop Both Eyes Daily  . protein supplement shake  11 oz Oral BID BM  . sodium chloride flush  3 mL Intravenous Q12H  . sodium chloride flush  3 mL Intravenous Q12H    Assessment: Patient admitted w/ SOB w/ trops up to 0.29, EKG showing some runs of Vtach, no anticoagulation PTA. Patient is being started on heparin drip.  Goal of Therapy:  Heparin level 0.3-0.7 units/ml Monitor platelets by anticoagulation protocol: Yes   Plan:  Will bolus heparin 4000 units IV x 1 Will start drip @ 1100 units/hr Will draw HL @ 0700 Baseline labs drawn Will monitor daily CBC's and adjust per HL's.  02/27 @ 0830: HL 0.14. Will bolus heparin 2700 units and increase infusion to 1400 units/hr. Will recheck HL in 6 hours.   02/28 @ 1600 s/p cath with sheath pulled at 1130. Cardiology wants to resume heparin drip until troponins trend down with infusion to begin at 2000. Will initiate infusion conservatively without bolus and with plans for slow titration to goal as per discussion with cardiology. HL/CBC 6 hours after infusion resumed.   02/28 @ 0200 HL 0.20 subtherapeutic. Will increase rate without bolus to 1350 units/hr and will recheck HL @ 1000. CBC trending down.  02/28 HL @ 1006 = 0.20. Will increase drip rate to 1500 units/hr as conservative dosing per note above. F/u HL in 6 hours.  2/28 HL @ 1735 = 0.41 is therapeutic. Continue heparin infusion at current rate of 1500 units/hr and order confirmatory HL in 6 hours.  02/28 @ 2000?? HL 0.47 therapeutic. Will redraw level 6 hours after since two levels were  drawn close together, possible lab error.  03/01 @ 0200 HL 0.56 therapeutic. Will continue current rate and will recheck next HL tomorrow w/ am labs. CBC trending down will continue to monitor.  Thomasene Ripple PharmD Clinical Pharmacist 03/05/2017

## 2017-03-05 NOTE — Progress Notes (Signed)
SATURATION QUALIFICATIONS: (This note is used to comply with regulatory documentation for home oxygen)  Patient Saturations on Room Air at Rest = 95%  Patient Saturations on Room Air while Ambulating = 93%  Patient Saturations on 0 Liters of oxygen while Ambulating = n/a%  Please briefly explain why patient needs home oxygen:

## 2017-03-05 NOTE — Progress Notes (Addendum)
Progress Note  Patient Name: Adam Douglas Date of Encounter: 03/05/2017  Primary Cardiologist: Julien Nordmann, MD  Subjective   Feeling much better this morning.  Responded well to Lasix yesterday and said he started perking up at some point yesterday afternoon.  He denies chest pain or dyspnea this morning.  Rested well last night.  Has been up walking some in his room this morning without difficulty.  Blood pressure soft this morning and medications held.  Inpatient Medications    Scheduled Meds: . aspirin EC  81 mg Oral Daily  . atorvastatin  80 mg Oral Daily  . carvedilol  3.125 mg Oral BID WC  . citalopram  40 mg Oral Daily  . clopidogrel  75 mg Oral Q breakfast  . docusate sodium  100 mg Oral BID  . furosemide  40 mg Intravenous BID  . insulin aspart  0-15 Units Subcutaneous TID WC  . insulin aspart  0-5 Units Subcutaneous QHS  . insulin glargine  30 Units Subcutaneous QHS  . isosorbide mononitrate  30 mg Oral Daily  . losartan  50 mg Oral Daily  . pantoprazole  40 mg Oral Daily  . polyvinyl alcohol  1 drop Both Eyes Daily  . protein supplement shake  11 oz Oral BID BM  . sodium chloride flush  3 mL Intravenous Q12H  . sodium chloride flush  3 mL Intravenous Q12H   Continuous Infusions: . sodium chloride    . sodium chloride    . heparin 1,500 Units/hr (03/05/17 0744)   PRN Meds: sodium chloride, sodium chloride, acetaminophen, ondansetron (ZOFRAN) IV, sodium chloride flush, sodium chloride flush, traMADol   Vital Signs    Vitals:   03/04/17 1957 03/04/17 2021 03/04/17 2031 03/05/17 0638  BP: (!) 70/29 (!) 97/48  (!) 94/36  Pulse: 80 86  72  Resp:    (!) 22  Temp:    98 F (36.7 C)  TempSrc:      SpO2:  95% 92% 95%  Weight:      Height:        Intake/Output Summary (Last 24 hours) at 03/05/2017 1010 Last data filed at 03/05/2017 0941 Gross per 24 hour  Intake 836.07 ml  Output 775 ml  Net 61.07 ml   Filed Weights   03/03/17 0436 03/03/17 1012  03/04/17 0700  Weight: 235 lb 3.2 oz (106.7 kg) 235 lb (106.6 kg) 236 lb 11.2 oz (107.4 kg)    Physical Exam   GEN: Obese, in no acute distress.  HEENT: Grossly normal.  Neck: Supple, difficult to gauge JVP secondary to girth, no carotid bruits, or masses. Cardiac: RRR, no murmurs, rubs, or gallops. No clubbing, cyanosis, edema.  Radials/DP/PT 2+ and equal bilaterally.  Right groin without bleeding, bruit, or hematoma. Respiratory:  Respirations regular and unlabored, clear to auscultation bilaterally. GI: Soft, nontender, nondistended, BS + x 4. MS: no deformity or atrophy. Skin: warm and dry, no rash. Neuro:  Strength and sensation are intact. Psych: AAOx3.  Normal affect.  Labs    Chemistry Recent Labs  Lab 03/02/17 2314 03/04/17 1134 03/05/17 0235  NA 139 138 137  K 4.2 4.2 4.0  CL 108 108 107  CO2 22 22 24   GLUCOSE 321* 237* 176*  BUN 19 25* 34*  CREATININE 1.17 1.22 1.47*  CALCIUM 8.7* 8.3* 8.1*  PROT 7.0  --   --   ALBUMIN 3.8  --   --   AST 40  --   --  ALT 31  --   --   ALKPHOS 89  --   --   BILITOT 0.7  --   --   GFRNONAA >60 60* 48*  GFRAA >60 >60 55*  ANIONGAP 9 8 6      Hematology Recent Labs  Lab 03/03/17 0524 03/04/17 0159 03/05/17 0235  WBC 11.9* 14.9* 9.3  RBC 4.31* 3.96* 3.57*  HGB 13.8 12.8* 11.5*  HCT 40.7 37.4* 33.9*  MCV 94.4 94.5 95.0  MCH 32.1 32.3 32.2  MCHC 34.0 34.2 33.9  RDW 13.9 14.0 14.0  PLT 207 203 169    Cardiac Enzymes Recent Labs  Lab 03/03/17 0524 03/03/17 1448 03/03/17 1730 03/04/17 1134  TROPONINI 6.69* 12.31* 11.12* 4.23*   BNP Recent Labs  Lab 03/02/17 2314  BNP 488.0*     Radiology    Dg Chest Port 1 View  Result Date: 03/04/2017 CLINICAL DATA:  67 year old male with shortness of breath. EXAM: PORTABLE CHEST 1 VIEW COMPARISON:  Chest radiograph dated 03/02/2017 FINDINGS: There is moderate cardiomegaly with overall slight increase in the size of the cardiopericardial silhouette compared to the  prior radiograph. There is vascular congestion similar to prior radiograph. No focal consolidation, pleural effusion, or pneumothorax. No acute osseous pathology. IMPRESSION: Moderate cardiomegaly with vascular congestion. No focal consolidation. Electronically Signed   By: Elgie Collard M.D.   On: 03/04/2017 05:27    Telemetry    RSR - Personally Reviewed  Cardiac Studies   Cardiac Catheterization 2.27.2019  Left mainstem: Large vessel that bifurcates into the LAD and left circumflex, 40% distal left main disease.  Left anterior descending (LAD): Large vessel that extends to the apical region, diagonal branch 2 of moderate size, 70% mid vessel, heavily calcified, diagonal #2 is a bifurcating small vessel, severely diseased ostial/proximal, 70% mid to distal LAD disease  Left circumflex (LCx): Large vessel with OM branch 2, ostial OM 1 has severe proximal disease, severe mid disease, mid to distal vessel is small. OM2 has severe ostial/proximal disease, 80 to 90%, moderate sized vessel  Right coronary artery (RCA): Right dominant vessel with PL and PDA, diffusly calcified, mild mid RCA disease, 50% PDA disease.  Left ventriculography: Left ventricular systolic function is severely depressed, EF<20%, mid inferior wall to apical region akinesis, global hypokinesis.  Final Conclusions:  Moderate to severe mid and distal LAD disease severe disease of small diagonal vessels, Severe disease of small OM1 and om2 vessels  Recommendations:  Case discussed with Dr. Okey Dupre, Culprit vessel unclear given diffuse severe small vessel disease. Medical management recommended Continue heparin until troponin trending down, then will change to plavix lipitor 80 mg daily with asa, coreg/ace for cardiomyopathy Cardiomyopathy appears out of proportion of his disease  _____________   2D Echocardiogram 2.27.2019  Study Conclusions  - Left ventricle: The cavity size was moderately dilated.  Systolic function was severely reduced. The estimated ejection fraction was in the range of 20% to 25%. Diffuse hypokinesis. Regional wall motion abnormalities cannot be excluded. Features are consistent with a pseudonormal left ventricular filling pattern, with concomitant abnormal relaxation and increased filling pressure (grade 2 diastolic dysfunction). - Mitral valve: There was mild regurgitation directed eccentrically. - Left atrium: The atrium was normal in size. - Right ventricle: Systolic function was normal. - Pulmonary arteries: Systolic pressure was within the normal range. _____________   Patient Profile     67 y.o.malewith a history of HTN, HL, DMII, obesity, and prior ocular strokes who was admitted 2/26 with chest pain, dyspnea,  and NSTEMI. Cath revealed moderate, diffuse, small vessel dzs w/o clear culprit. EF 20-25%. Med Rx rec.  Assessment & Plan    1.  Non-STEMI/CAD: Status post catheterization February 27 revealing moderate, diffuse, small vessel disease.  Medical therapy recommended.  He developed dyspnea and volume overload on February 28 and was successfully diuresed.  He is feeling much better this morning.  I was concerned that maybe he had experienced more ischemia however, troponin yesterday was trending down.  Creatinine bumped slightly and I have discontinued IV Lasix.  Blood pressure soft in the setting of probable mild dehydration.  He has not yet received any of his a.m. medications.  I will reduce his losartan to 25 mg daily and Imdur to 15 mg daily.  Plan to continue carvedilol.  Ambulate.  He is eager to go home but I would like to see him ambulate and see how he feels and see how blood pressure responds prior to making that decision.  I think he will be best served by another day in the hospital.  DC heparin.  2.  Ischemic cardiomyopathy/HFrEF: EF 20-25%.  EDP was 21 mmHg at the time of cath.  He did have heart failure post catheterization  and responded well to IV Lasix (? Accuracy of I/O).  Lungs are clear today.  He appears euvolemic though exam challenging secondary to body habitus.  As above, continue beta-blocker and reduce losartan to 25 mg daily.  He had been on lisinopril HCTZ at home and I switched him to losartan yesterday in hopes that we could transition him to [place].  Blood pressure too soft for that today.  In the setting of relative hypotension and bump in creatinine, I will also hold off on initiation of Spironolactone.  We can look to make these adjustments as an outpatient.  3.  Essential hypertension: As above, patient actually hypotensive earlier and now relatively hypotensive.  He is asymptomatic.  Follow blood pressure today.  A.m. meds held.  4.  Type 2 diabetes mellitus: A1c 7.1.  Continue sliding scale insulin.  He was on metformin at home.  Would plan to resume this at discharge and would also recommend initiation of Jardiance given cardiomyopathy/HFrEF.  5.  Hyperlipidemia: LDL 36.  Continue high potency statin therapy.  6. AKI:  In setting of diuresis and contrast on 2/27.  Hold lasix and losartan this AM.  F/u bmet in AM.  Signed, Nicolasa Ducking, NP  03/05/2017, 10:10 AM    For questions or updates, please contact   Please consult [email or web address] for contact info under Cardiology/STEMI.

## 2017-03-05 NOTE — Progress Notes (Signed)
Inpatient Diabetes Program Recommendations  AACE/ADA: New Consensus Statement on Inpatient Glycemic Control (2015)  Target Ranges:  Prepandial:   less than 140 mg/dL      Peak postprandial:   less than 180 mg/dL (1-2 hours)      Critically ill patients:  140 - 180 mg/dL   Lab Results  Component Value Date   GLUCAP 283 (H) 03/05/2017   HGBA1C 7.1 (H) 03/03/2017    Review of Glycemic Control  Results for Adam Douglas, Adam Douglas (MRN [id number]) as of 03/05/2017 12:14  Ref. Range 03/04/2017 12:18 03/04/2017 16:59 03/04/2017 21:26 03/05/2017 07:58 03/05/2017 11:44  Glucose-Capillary Latest Ref Range: 65 - 99 mg/dL 415 (H) 830 (H) 940 (H) 176 (H) 283 (H)   Diabetes history: Type 2 Outpatient Diabetes medications: Novolog 15 units tid, Novolog 70/30 50 units qam, 20 units q evening, Glucophage 500 mg bid  Current orders for Inpatient glycemic control: Lantus 30 units qhs, Novolog 0-15 units tid, Novolog 0-5 units qhs  Inpatient Diabetes Program Recommendations:  Elevated noon CBG today because patient did not get Novolog insulin (see MAR) Post prandial CBG remain elevated despite correction insulin-consider adding Novolog 3 units tid if the patient eats more than 50%  Susette Racer, RN, [place], [place], CDE Diabetes Coordinator Inpatient Diabetes Program  [phone] (Team Pager) [phone] [facility] [place] Office) 03/05/2017 12:17 PM

## 2017-03-05 NOTE — Care Management (Signed)
Admitted with nstemi and CHF.  Currently on room air and has not qualified for home oxygen.  Findings on cardiac cath will be managed medically.  No issues obtaining meds, accessing medical care or with transportation

## 2017-03-05 NOTE — Progress Notes (Signed)
Pt ambulated around nurses station. Pt shows no s/s of distress, no shortness of breath noted. Pt oxygen remained above 92%. I will continue to assess,.

## 2017-03-05 NOTE — Progress Notes (Signed)
67 year old male with hx of HTN, HLD, DMII, Obesity, and prior ocular strokes, who was admitted 03/02/2017 with chest pain, dyspnea, and NSTEMI. Cardiac Cath revealed moderate, diffuse, small vessel disease without clear culprit.  EF 20-25%.    Patient sitting up in bed watching TV and wife resting in recliner.    CHF Education:   (Education related to NSTEMI completed by this RN on 03/03/2017).   CHF Educational session with patient completed.  Provided patient with "Living Better with Heart Failure" packet. Briefly reviewed definition of heart failure and signs and symptoms of an exacerbation.?Reviewed the meaning of EF, what patient's EF value is compared to normal value.  Explained to patient that HF is a chronic illness which requires self-assessment / self-management along with help from the cardiologist/PCP.??  *Reviewed importance of and reason behind checking weight daily in the AM, after using the bathroom, but before getting dressed. Patient does not have scales, but will purchase scales.??  Reviewed the following information with patient:  *Discussed when to call the Dr= weight gain of >2-3lb overnight of 5lb in a week,  *Discussed yellow zone= call MD: weight gain of >2-3lb overnight of 5lb in a week, increased swelling, increased SOB when lying down, chest discomfort, dizziness, increased fatigue *Red Zone= call 911: struggle to breath, fainting or near fainting, significant chest pain   *Reviewed low sodium diet-provided handout of recommended and not recommended foods. Reviewed reading labels with patient. Discussed fluid intake with patient as well. Patient not currently on a fluid restriction, but advised no more than 8-8 ounces glass of fluids per day.?Note:  Dietitian Consultation for diet education completed on 03/04/2017.    *Instructed patient to take medications as prescribed for heart failure. Explained briefly why pt is on the medications (either make you feel better, live  longer or keep you out of the hospital) and discussed monitoring and side effects.   *Discussed exercise.  Patient planning to participate in Cardiac Rehab.  In the meantime patient encouraged to remain as active as possible.  ? *Smoking Cessation - Patient is a former smoker.??  Again, the 5 Steps to Living Better with Heart Failure were reviewed with patient and wife.  Patient thanked me for providing the above information. ?  Army Melia, RN, BSN, [place]? [facility]  [facility] Cardiac &?Pulmonary Rehab  Cardiovascular &?Pulmonary Nurse Navigator  Direct Line: [phone]  Department Phone #: [phone] Fax: [phone]? Email Address: [email or web address]?

## 2017-03-05 NOTE — Progress Notes (Signed)
[facility] [place] [facility]   PATIENT NAME: Adam Douglas    MR#:  [id number]  DATE OF BIRTH:  17-Dec-1950  SUBJECTIVE:  CHIEF COMPLAINT:   Chief Complaint  Patient presents with  . Shortness of Breath   Shortness of breath is resolved.  No chest pain.  Feels much better today.  REVIEW OF SYSTEMS:    Review of Systems  Constitutional: Positive for malaise/fatigue. Negative for chills and fever.  HENT: Negative for sore throat.   Eyes: Negative for blurred vision, double vision and pain.  Respiratory: Negative for cough, hemoptysis, shortness of breath and wheezing.   Cardiovascular: Negative for chest pain, palpitations, orthopnea and leg swelling.  Gastrointestinal: Negative for abdominal pain, constipation, diarrhea, heartburn, nausea and vomiting.  Genitourinary: Negative for dysuria and hematuria.  Musculoskeletal: Negative for back pain and joint pain.  Skin: Negative for rash.  Neurological: Positive for weakness. Negative for sensory change, speech change, focal weakness and headaches.  Endo/Heme/Allergies: Does not bruise/bleed easily.  Psychiatric/Behavioral: Negative for depression. The patient is not nervous/anxious.     DRUG ALLERGIES:  No Known Allergies  VITALS:  Blood pressure (!) 114/49, pulse 86, temperature 98 F (36.7 C), resp. rate (!) 22, height 5\' 8"  (1.727 m), weight 107.4 kg (236 lb 11.2 oz), SpO2 93 %.  PHYSICAL EXAMINATION:   Physical Exam  GENERAL:  67 y.o.-year-old patient lying in the bed with no acute distress.  EYES: Pupils equal, round, reactive to light and accommodation. No scleral icterus. Extraocular muscles intact.  HEENT: Head atraumatic, normocephalic. Oropharynx and nasopharynx clear.  NECK:  Supple, no jugular venous distention. No thyroid enlargement, no tenderness.  LUNGS: Clear to auscultation.  No wheezing or crackles. CARDIOVASCULAR: S1, S2 normal. No murmurs, rubs, or gallops.  ABDOMEN: Soft,  nontender, nondistended. Bowel sounds present. No organomegaly or mass.  EXTREMITIES: No cyanosis, clubbing or edema b/l.    NEUROLOGIC: Cranial nerves II through XII are intact. No focal Motor or sensory deficits b/l.   PSYCHIATRIC: The patient is alert and oriented x 3.  SKIN: No obvious rash, lesion, or ulcer.   LABORATORY PANEL:   CBC Recent Labs  Lab 03/05/17 0235  WBC 9.3  HGB 11.5*  HCT 33.9*  PLT 169   ------------------------------------------------------------------------------------------------------------------ Chemistries  Recent Labs  Lab 03/02/17 2314  03/05/17 0235  NA 139   < > 137  K 4.2   < > 4.0  CL 108   < > 107  CO2 22   < > 24  GLUCOSE 321*   < > 176*  BUN 19   < > 34*  CREATININE 1.17   < > 1.47*  CALCIUM 8.7*   < > 8.1*  AST 40  --   --   ALT 31  --   --   ALKPHOS 89  --   --   BILITOT 0.7  --   --    < > = values in this interval not displayed.   ------------------------------------------------------------------------------------------------------------------  Cardiac Enzymes Recent Labs  Lab 03/04/17 1134  TROPONINI 4.23*   ------------------------------------------------------------------------------------------------------------------  RADIOLOGY:  Dg Chest Port 1 View  Result Date: 03/04/2017 CLINICAL DATA:  67 year old male with shortness of breath. EXAM: PORTABLE CHEST 1 VIEW COMPARISON:  Chest radiograph dated 03/02/2017 FINDINGS: There is moderate cardiomegaly with overall slight increase in the size of the cardiopericardial silhouette compared to the prior radiograph. There is vascular congestion similar to prior radiograph. No focal consolidation, pleural effusion, or pneumothorax. No  acute osseous pathology. IMPRESSION: Moderate cardiomegaly with vascular congestion. No focal consolidation. Electronically Signed   By: Elgie Collard M.D.   On: 03/04/2017 05:27     ASSESSMENT AND PLAN:   *Acute on chronic systolic congestive  heart failure.  Ejection fraction 20-25%. Diuresed well with Lasix.  Symptoms improved. On Coreg and Cozaar, Imdur  *Non-ST elevation MI.  Cardiac catheterization with diffuse disease.  Medical management advised. On aspirin and Plavix.   *Hypertension.  On Coreg, Cozaar and Imdur. Coreg held today due to low normal blood pressure.  *Acute hypoxic respiratory failure secondary to pulmonary edema from systolic CHF.   Resolved.  On room air.  *Acute kidney injury over CKD stage III.  Due to diuresis.  Lasix held today.  Will repeat lab work in the morning. Patient also received contrast with catheterization and will need creatinine followed tomorrow.  All the records are reviewed and case discussed with Care Management/Social Worker Management plans discussed with the patient, family and they are in agreement.  CODE STATUS: FULL CODE  DVT Prophylaxis: SCDs  TOTAL TIME TAKING CARE OF THIS PATIENT: 30 minutes.   POSSIBLE D/C IN 2-3 DAYS, DEPENDING ON CLINICAL CONDITION.  [name] M.D on 03/05/2017 at 12:55 PM  Between 7am to 6pm - Pager - [phone]  After 6pm go to [email or web address] - password [email or web address] [facility]  [facility] [place] [facility]  Office  [phone]  CC: Primary care physician; Delton Prairie, MD  Note: This dictation was prepared with Dragon dictation along with smaller phrase technology. Any transcriptional errors that result from this process are unintentional.

## 2017-03-05 NOTE — Plan of Care (Signed)
Pt with no complaints of pain this shift, did sleep better tonight, heparin gtt still infusing. 2L O2 which is acute. Up with standby assist to bathroom BM last nigh 2/28. Wife at bedside.

## 2017-03-05 NOTE — Progress Notes (Signed)
Marvis Repress, NP notified of patients low blood pressure. Rn will await any new orders. I will continue to assess.

## 2017-03-06 LAB — BASIC METABOLIC PANEL
Anion gap: 10 (ref 5–15)
BUN: 26 mg/dL — AB (ref 6–20)
CHLORIDE: 106 mmol/L (ref 101–111)
CO2: 22 mmol/L (ref 22–32)
Calcium: 8.8 mg/dL — ABNORMAL LOW (ref 8.9–10.3)
Creatinine, Ser: 1.09 mg/dL (ref 0.61–1.24)
GFR calc Af Amer: >60 mL/min (ref >60)
GFR calc non Af Amer: >60 mL/min (ref >60)
Glucose, Bld: 230 mg/dL — ABNORMAL HIGH (ref 65–99)
Potassium: 4.2 mmol/L (ref 3.5–5.1)
Sodium: 138 mmol/L (ref 135–145)

## 2017-03-06 LAB — GLUCOSE, CAPILLARY: Glucose-Capillary: 199 mg/dL — ABNORMAL HIGH (ref 65–99)

## 2017-03-06 MED ORDER — LOSARTAN POTASSIUM 25 MG PO TABS: 25.0000 mg | ORAL_TABLET | Freq: Every day | ORAL | 0 refills | Status: DC

## 2017-03-06 MED ORDER — FUROSEMIDE 20 MG PO TABS: 20.0000 mg | ORAL_TABLET | Freq: Every day | ORAL | 0 refills | Status: DC

## 2017-03-06 MED ORDER — CARVEDILOL 6.25 MG PO TABS: 6.2500 mg | ORAL_TABLET | Freq: Two times a day (BID) | ORAL | 0 refills | Status: DC

## 2017-03-06 MED ORDER — FUROSEMIDE 20 MG PO TABS
20.0000 mg | ORAL_TABLET | Freq: Every day | ORAL | Status: DC
  Administered 2017-03-06: 20 mg via ORAL
  Filled 2017-03-06: qty 1

## 2017-03-06 MED ORDER — ISOSORBIDE MONONITRATE ER 30 MG PO TB24: 15.0000 mg | ORAL_TABLET | Freq: Every day | ORAL | 0 refills | Status: DC

## 2017-03-06 MED ORDER — CLOPIDOGREL BISULFATE 75 MG PO TABS: 75.0000 mg | ORAL_TABLET | Freq: Every day | ORAL | 0 refills | Status: DC

## 2017-03-06 MED ORDER — CARVEDILOL 6.25 MG PO TABS: 6.2500 mg | ORAL_TABLET | Freq: Two times a day (BID) | ORAL | Status: DC

## 2017-03-06 MED ORDER — INSULIN NPH ISOPHANE & REGULAR (70-30) 100 UNIT/ML ~~LOC~~ SUSP: 20.0000 [IU] | Freq: Two times a day (BID) | SUBCUTANEOUS | 11 refills | Status: DC

## 2017-03-06 NOTE — Discharge Summary (Signed)
[facility] - [place] at [facility]   PATIENT NAME: Adam Douglas    MR#:  [id number]  DATE OF BIRTH:  [birth year]  DATE OF ADMISSION:  03/02/2017 ADMITTING PHYSICIAN: Arnaldo Natal, MD  DATE OF DISCHARGE: 03/06/2017  PRIMARY CARE PHYSICIAN: Delton Prairie, MD    ADMISSION DIAGNOSIS:  Demand ischemia (HCC) [I24.8] Elevated troponin I level [R74.8] Acute respiratory failure with hypoxemia (HCC) [J96.01] Acute congestive heart failure, unspecified heart failure type (HCC) [I50.9]  DISCHARGE DIAGNOSIS:  Active Problems:   NSTEMI (non-ST elevated myocardial infarction) (HCC)   SECONDARY DIAGNOSIS:   Past Medical History:  Diagnosis Date  . CAD (coronary artery disease)    a. 02/2017 NSTEMI/Cath: LM 40d, LAD 94m, 29m/d, D2 small w/ sev prox/m dzs, LCX nl, OM1 sev diff dzs - small vessel, OM2 80-90p/m, RCA dominant 68m, RPDA 50, EF <20%-->Med Rx.  . Diabetes mellitus without complication (HCC)   . HFrEF (heart failure with reduced ejection fraction) (HCC)    a. 02/2017 Echo: EF 20-25%.  Marland Kitchen Hyperlipidemia   . Hypertension   . Ischemic cardiomyopathy    a. 02/2017 Echo: EF 20-25%, diff HK, Gr2 DD, mild MR.  . LBBB (left bundle branch block)    a. Noted 02/2017. Pt denies any known prior history of LBBB (not present on 2014 [facility] ECG interpretation).  . Morbid obesity (HCC)   . Stroke [facility])    a. Ocular strokes x 3 - prev eval in [facility] for first 2, [facility] for last one.    HOSPITAL COURSE:   *Acute on chronic systolic congestive heart failure.  Ejection fraction 20-25%. Diuresed well with Lasix.  Symptoms improved. On Coreg and Cozaar, Imdur  Appreciate help by cardio, d/c on small dose lasix.  Need to add spironolactone and start enteresto in next few weeks from office.  I counseled about salt and fluid restrictions.  *Non-ST elevation MI.  Cardiac catheterization with diffuse disease.  Medical management advised. On aspirin and Plavix.    *Hypertension.  On Coreg, Cozaar and Imdur. Coreg held today due to low normal blood pressure.  now resume on d/c.  *Acute hypoxic respiratory failure secondary to pulmonary edema from systolic CHF.   Resolved.  On room air.  *Acute kidney injury over CKD stage III.  Due to diuresis.  Lasix held today.  Will repeat lab work in the morning. Patient also received contrast with catheterization  Renal func improved.   DISCHARGE CONDITIONS:   Stable  CONSULTS OBTAINED:  Treatment Team:  Antonieta Iba, MD  DRUG ALLERGIES:  No Known Allergies  DISCHARGE MEDICATIONS:   Allergies as of 03/06/2017   No Known Allergies     Medication List    STOP taking these medications   gentamicin 0.3 % ophthalmic ointment Commonly known as:  GARAMYCIN   lisinopril-hydrochlorothiazide 20-12.5 MG tablet Commonly known as:  PRINZIDE,ZESTORETIC     TAKE these medications   aspirin EC 325 MG tablet Take 325 mg by mouth daily.   atorvastatin 40 MG tablet Commonly known as:  LIPITOR Take 40 mg by mouth daily.   carvedilol 6.25 MG tablet Commonly known as:  COREG Take 1 tablet (6.25 mg total) by mouth 2 (two) times daily with a meal.   citalopram 40 MG tablet Commonly known as:  CELEXA Take 40 mg by mouth daily.   clopidogrel 75 MG tablet Commonly known as:  PLAVIX Take 1 tablet (75 mg total) by mouth daily with breakfast. Start taking on:  03/07/2017   furosemide 20 MG tablet Commonly known as:  LASIX Take 1 tablet (20 mg total) by mouth daily. Start taking on:  03/07/2017   insulin aspart 100 UNIT/ML injection Commonly known as:  novoLOG Inject 15 Units into the skin 3 (three) times daily with meals.   insulin NPH-regular Human (70-30) 100 UNIT/ML injection Commonly known as:  NOVOLIN 70/30 Inject 20-50 Units into the skin 2 (two) times daily with a meal. 30 units in the morning and 20 at night What changed:  additional instructions   isosorbide mononitrate 30 MG 24  hr tablet Commonly known as:  IMDUR Take 0.5 tablets (15 mg total) by mouth daily. Start taking on:  03/07/2017   losartan 25 MG tablet Commonly known as:  COZAAR Take 1 tablet (25 mg total) by mouth daily. Start taking on:  03/07/2017   metFORMIN 500 MG 24 hr tablet Commonly known as:  GLUCOPHAGE-XR Take 500 mg by mouth 2 (two) times daily.   omeprazole 20 MG capsule Commonly known as:  PRILOSEC Take 20 mg by mouth 2 (two) times daily before a meal.   SYSTANE 0.4-0.3 % Soln Generic drug:  Polyethyl Glycol-Propyl Glycol Apply 1 drop to eye daily.   traMADol 50 MG tablet Commonly known as:  ULTRAM Take 50 mg by mouth every 8 (eight) hours as needed for pain.        DISCHARGE INSTRUCTIONS:    Follow with cardiology clinic in 2 week.  If you experience worsening of your admission symptoms, develop shortness of breath, life threatening emergency, suicidal or homicidal thoughts you must seek medical attention immediately by calling 911 or calling your MD immediately  if symptoms less severe.  You Must read complete instructions/literature along with all the possible adverse reactions/side effects for all the Medicines you take and that have been prescribed to you. Take any new Medicines after you have completely understood and accept all the possible adverse reactions/side effects.   Please note  You were cared for by a hospitalist during your hospital stay. If you have any questions about your discharge medications or the care you received while you were in the hospital after you are discharged, you can call the unit and asked to speak with the hospitalist on call if the hospitalist that took care of you is not available. Once you are discharged, your primary care physician will handle any further medical issues. Please note that NO REFILLS for any discharge medications will be authorized once you are discharged, as it is imperative that you return to your primary care physician (or  establish a relationship with a primary care physician if you do not have one) for your aftercare needs so that they can reassess your need for medications and monitor your lab values.    Today   CHIEF COMPLAINT:   Chief Complaint  Patient presents with  . Shortness of Breath    HISTORY OF PRESENT ILLNESS:  Mikail Goostree  is a 67 y.o. male with a known history of stroke, history of left bundle branch block, hypertension and diabetes presents to the emergency department with chest pain.  The pain began while at rest as the patient was lying in bed.  It was over his left chest and radiated across his upper chest bilaterally.  The pain did not last long but the patient became diaphoretic and acutely short of breath and remained so in the emergency department.  Oxygen saturations were barely 90% on 10 L of oxygen via  facemask.  He was placed on BiPAP which improved his work of breathing.  Chest x-ray demonstrated vascular congestion and interstitial edema.  The patient was given Lasix and aspirin.  Laboratory evaluation revealed elevated troponin which prompted the emergency department staff to place him on therapeutic heparin for non-ST elevation myocardial ischemia.  The hospitalist service was called for further management.   VITAL SIGNS:  Blood pressure 133/67, pulse 88, temperature 97.7 F (36.5 C), temperature source Oral, resp. rate 18, height 5\' 8"  (1.727 m), weight 103.3 kg (227 lb 11.2 oz), SpO2 96 %.  I/O:    Intake/Output Summary (Last 24 hours) at 03/06/2017 1034 Last data filed at 03/06/2017 1007 Gross per 24 hour  Intake 966 ml  Output 2975 ml  Net -2009 ml    PHYSICAL EXAMINATION:  GENERAL:  67 y.o.-year-old patient lying in the bed with no acute distress.  EYES: Pupils equal, round, reactive to light and accommodation. No scleral icterus. Extraocular muscles intact.  HEENT: Head atraumatic, normocephalic. Oropharynx and nasopharynx clear.  NECK:  Supple, no jugular  venous distention. No thyroid enlargement, no tenderness.  LUNGS: Normal breath sounds bilaterally, no wheezing, rales,rhonchi or crepitation. No use of accessory muscles of respiration.  CARDIOVASCULAR: S1, S2 normal. No murmurs, rubs, or gallops.  ABDOMEN: Soft, non-tender, non-distended. Bowel sounds present. No organomegaly or mass.  EXTREMITIES: No pedal edema, cyanosis, or clubbing.  NEUROLOGIC: Cranial nerves II through XII are intact. Muscle strength 5/5 in all extremities. Sensation intact. Gait not checked.  PSYCHIATRIC: The patient is alert and oriented x 3.  SKIN: No obvious rash, lesion, or ulcer.   DATA REVIEW:   CBC Recent Labs  Lab 03/05/17 0235  WBC 9.3  HGB 11.5*  HCT 33.9*  PLT 169    Chemistries  Recent Labs  Lab 03/02/17 2314  03/06/17 0622  NA 139   < > 138  K 4.2   < > 4.2  CL 108   < > 106  CO2 22   < > 22  GLUCOSE 321*   < > 230*  BUN 19   < > 26*  CREATININE 1.17   < > 1.09  CALCIUM 8.7*   < > 8.8*  AST 40  --   --   ALT 31  --   --   ALKPHOS 89  --   --   BILITOT 0.7  --   --    < > = values in this interval not displayed.    Cardiac Enzymes Recent Labs  Lab 03/04/17 1134  TROPONINI 4.23*    Microbiology Results  No results found for this or any previous visit.  RADIOLOGY:  No results found.  EKG:   Orders placed or performed during the hospital encounter of 03/02/17  . ED EKG  . ED EKG  . EKG 12-Lead  . EKG 12-Lead  . EKG 12-Lead  . EKG 12-Lead      Management plans discussed with the patient, family and they are in agreement.  CODE STATUS:     Code Status Orders  (From admission, onward)        Start     Ordered   03/03/17 0437  Full code  Continuous     03/03/17 0436    Code Status History    Date Active Date Inactive Code Status Order ID Comments User Context   This patient has a current code status but no historical code status.    Advance Directive Documentation  Most Recent Value  Type of  Advance Directive  Healthcare Power of Attorney, Living will  Pre-existing out of facility DNR order (yellow form or pink MOST form)  No data  "MOST" Form in Place?  No data      TOTAL TIME TAKING CARE OF THIS PATIENT: 35 minutes.    Altamese Dilling M.D on 03/06/2017 at 10:34 AM  Between 7am to 6pm - Pager - [phone]  After 6pm go to [email or web address] - Social research officer, government  [facility]  Office  [phone]  CC: Primary care physician; Delton Prairie, MD   Note: This dictation was prepared with Dragon dictation along with smaller phrase technology. Any transcriptional errors that result from this process are unintentional.

## 2017-03-06 NOTE — Progress Notes (Signed)
Progress Note  Patient Name: Adam Douglas Date of Encounter: 03/06/2017  Primary Cardiologist: Julien Nordmann, MD   Subjective   Had orthopnea early AM now resolved; denies dyspnea or chest pain  Inpatient Medications    Scheduled Meds: . aspirin EC  81 mg Oral Daily  . atorvastatin  80 mg Oral Daily  . carvedilol  3.125 mg Oral BID WC  . citalopram  40 mg Oral Daily  . clopidogrel  75 mg Oral Q breakfast  . docusate sodium  100 mg Oral BID  . insulin aspart  0-15 Units Subcutaneous TID WC  . insulin aspart  0-5 Units Subcutaneous QHS  . insulin glargine  30 Units Subcutaneous QHS  . isosorbide mononitrate  15 mg Oral Daily  . losartan  25 mg Oral Daily  . pantoprazole  40 mg Oral Daily  . polyvinyl alcohol  1 drop Both Eyes Daily  . protein supplement shake  11 oz Oral BID BM  . sodium chloride flush  3 mL Intravenous Q12H   Continuous Infusions:  PRN Meds: acetaminophen, ondansetron (ZOFRAN) IV, sodium chloride flush, traMADol   Vital Signs    Vitals:   03/05/17 2003 03/06/17 0418 03/06/17 0539 03/06/17 0807  BP: 136/66 (!) 157/86  133/67  Pulse: 82 93 (!) 103 88  Resp: 20 18    Temp: 98.3 F (36.8 C) 98.4 F (36.9 C)  97.7 F (36.5 C)  TempSrc: Oral Oral  Oral  SpO2: 93% 91% 95% 96%  Weight:  227 lb 11.2 oz (103.3 kg)    Height:        Intake/Output Summary (Last 24 hours) at 03/06/2017 0905 Last data filed at 03/06/2017 0544 Gross per 24 hour  Intake 723 ml  Output 2975 ml  Net -2252 ml   Filed Weights   03/04/17 0700 03/06/17 0418  Weight: 236 lb 11.2 oz (107.4 kg) 227 lb 11.2 oz (103.3 kg)    Telemetry    Sinus with islolated couplet - Personally Reviewed   Physical Exam   GEN: No acute distress.   Neck: No JVD Cardiac: RRR, no murmurs, rubs, or gallops.  Respiratory: Clear to auscultation bilaterally. GI: Soft, nontender, non-distended, right groin with no hematoma and no bruit  MS: No edema Neuro:  Nonfocal  Psych: Normal affect    Labs    Chemistry Recent Labs  Lab 03/02/17 2314 03/04/17 1134 03/05/17 0235 03/06/17 0622  NA 139 138 137 138  K 4.2 4.2 4.0 4.2  CL 108 108 107 106  CO2 22 22 24 22   GLUCOSE 321* 237* 176* 230*  BUN 19 25* 34* 26*  CREATININE 1.17 1.22 1.47* 1.09  CALCIUM 8.7* 8.3* 8.1* 8.8*  PROT 7.0  --   --   --   ALBUMIN 3.8  --   --   --   AST 40  --   --   --   ALT 31  --   --   --   ALKPHOS 89  --   --   --   BILITOT 0.7  --   --   --   GFRNONAA >60 60* 48* >60  GFRAA >60 >60 55* >60  ANIONGAP 9 8 6 10      Hematology Recent Labs  Lab 03/03/17 0524 03/04/17 0159 03/05/17 0235  WBC 11.9* 14.9* 9.3  RBC 4.31* 3.96* 3.57*  HGB 13.8 12.8* 11.5*  HCT 40.7 37.4* 33.9*  MCV 94.4 94.5 95.0  MCH 32.1 32.3 32.2  MCHC 34.0 34.2 33.9  RDW 13.9 14.0 14.0  PLT 207 203 169    Cardiac Enzymes Recent Labs  Lab 03/03/17 0524 03/03/17 1448 03/03/17 1730 03/04/17 1134  TROPONINI 6.69* 12.31* 11.12* 4.23*    BNP Recent Labs  Lab 03/02/17 2314  BNP 488.0*      Patient Profile     67 y.o.malewith a history of HTN, HL, DMII, obesity, and prior ocular strokeswho was admitted 2/26 with chest pain, dyspnea, and NSTEMI. Cath revealed moderate, diffuse, small vessel dzs w/o clear culprit. EF 20-25%. Med Rx rec.  Assessment & Plan    1 status post non-ST elevation myocardial infarction-patient denies recurrent chest pain.  Continue aspirin, Plavix, statin and beta-blocker.  Plan is medical therapy for his coronary artery disease.    2 acute systolic congestive heart failure-patient appears to be euvolemic this morning.  He did have orthopnea last evening.  Renal function has improved.  Add Lasix 20 mg daily.  Check potassium and renal function in 1 week.  Would add Spironolactone as an outpatient if renal function stable.  Patient instructed on low-sodium diet and fluid restriction.  3 ischemic cardiomyopathy-continue ARB.  Increase carvedilol to 6.25 mg twice daily.  Would  consider transition to [facility] as an outpatient.  He will need follow-up echocardiogram in 3 months.  If ejection fraction less than 35% would need to consider CRT-D.  4 hypertension-blood pressure is controlled.  Medications as outlined above.  5 hyperlipidemia-continue statin.  6 acute kidney injury-renal function has improved this morning.  Patient to ambulate today.  If stable from a symptomatic standpoint he could be discharged with outpatient follow-up with Dr. Mariah Milling.  For questions or updates, please contact [facility] Please consult [email or web address] for contact info under Cardiology/STEMI.      Signed, Olga Millers, MD  03/06/2017, 9:05 AM

## 2017-03-06 NOTE — Progress Notes (Signed)
Pt ambulated by student nurse around the nurses station. Oxygen saturation remained above 90%. Student Nurse patient was not short of breath and she did not notice any difficulty while ambulating. I will continue to assess.

## 2017-03-06 NOTE — Progress Notes (Signed)
Pt ambulated around the nurses station on room air with a SPO2 of 90%. When pt returned to the room his SPO2 increased to 92 after sitting for 3 minutes.  Primary RN notified of these results.

## 2017-03-09 ENCOUNTER — Ambulatory Visit (INDEPENDENT_AMBULATORY_CARE_PROVIDER_SITE_OTHER): Payer: Medicare HMO | Admitting: Nurse Practitioner

## 2017-03-09 ENCOUNTER — Encounter: Payer: Self-pay | Admitting: Nurse Practitioner

## 2017-03-09 VITALS — BP 120/70 | HR 71 | Ht 68.0 in | Wt 231.2 lb

## 2017-03-09 DIAGNOSIS — I5022 Chronic systolic (congestive) heart failure: Secondary | ICD-10-CM | POA: Diagnosis not present

## 2017-03-09 DIAGNOSIS — I10 Essential (primary) hypertension: Secondary | ICD-10-CM | POA: Diagnosis not present

## 2017-03-09 DIAGNOSIS — E1159 Type 2 diabetes mellitus with other circulatory complications: Secondary | ICD-10-CM | POA: Diagnosis not present

## 2017-03-09 DIAGNOSIS — Z794 Long term (current) use of insulin: Secondary | ICD-10-CM | POA: Diagnosis not present

## 2017-03-09 DIAGNOSIS — E785 Hyperlipidemia, unspecified: Secondary | ICD-10-CM | POA: Diagnosis not present

## 2017-03-09 DIAGNOSIS — I214 Non-ST elevation (NSTEMI) myocardial infarction: Secondary | ICD-10-CM | POA: Diagnosis not present

## 2017-03-09 DIAGNOSIS — I255 Ischemic cardiomyopathy: Secondary | ICD-10-CM | POA: Diagnosis not present

## 2017-03-09 DIAGNOSIS — I2510 Atherosclerotic heart disease of native coronary artery without angina pectoris: Secondary | ICD-10-CM

## 2017-03-09 MED ORDER — CLOPIDOGREL BISULFATE 75 MG PO TABS: 75.0000 mg | ORAL_TABLET | Freq: Every day | ORAL | 2 refills | Status: DC

## 2017-03-09 MED ORDER — FUROSEMIDE 20 MG PO TABS: 20.0000 mg | ORAL_TABLET | Freq: Every day | ORAL | 2 refills | Status: DC

## 2017-03-09 MED ORDER — CARVEDILOL 6.25 MG PO TABS: 6.2500 mg | ORAL_TABLET | Freq: Two times a day (BID) | ORAL | 2 refills | Status: DC

## 2017-03-09 MED ORDER — SACUBITRIL-VALSARTAN 24-26 MG PO TABS: 1.0000 | ORAL_TABLET | Freq: Two times a day (BID) | ORAL | 3 refills | Status: DC

## 2017-03-09 MED ORDER — ISOSORBIDE MONONITRATE ER 30 MG PO TB24: 15.0000 mg | ORAL_TABLET | Freq: Every day | ORAL | 2 refills | Status: DC

## 2017-03-09 MED ORDER — ASPIRIN EC 81 MG PO TBEC: 81.0000 mg | DELAYED_RELEASE_TABLET | Freq: Every day | ORAL | 3 refills | Status: AC

## 2017-03-09 NOTE — Progress Notes (Signed)
Office Visit    Patient Name: Adam Douglas Date of Encounter: 03/09/2017  Primary Care Provider:  Delton Prairie, MD Primary Cardiologist:  Julien Nordmann, MD  Chief Complaint    67 year old male with a history of diabetes, hypertension, hyperlipidemia, ocular strokes, and recent admission for non-STEMI with finding of left bundle branch block, severe multivessel CAD and an ischemic cardiomyopathy with an EF of 20-25%.  Past Medical History    Past Medical History:  Diagnosis Date  . CAD (coronary artery disease)    a. 02/2017 NSTEMI/Cath: LM 40d, LAD 71m, 107m/d, D2 small w/ sev prox/m dzs, LCX nl, OM1 sev diff dzs - small vessel, OM2 80-90p/m, RCA dominant 5m, RPDA 50, EF <20%-->Med Rx.  . Diabetes mellitus without complication (HCC)   . HFrEF (heart failure with reduced ejection fraction) (HCC)    a. 02/2017 Echo: EF 20-25%.  Marland Kitchen Hyperlipidemia   . Hypertension   . Ischemic cardiomyopathy    a. 02/2017 Echo: EF 20-25%, diff HK, Gr2 DD, mild MR.  . LBBB (left bundle branch block)    a. Noted 02/2017. Pt denies any known prior history of LBBB (not present on 2014 [facility] ECG interpretation).  . Morbid obesity (HCC)   . Stroke [facility])    a. Ocular strokes x 3 - prev eval in [facility] for first 2, [facility] for last one.   Past Surgical History:  Procedure Laterality Date  . CERVICAL DISC SURGERY    . CHOLECYSTECTOMY    . LEFT HEART CATH AND CORONARY ANGIOGRAPHY N/A 03/03/2017   Procedure: LEFT HEART CATH AND CORONARY ANGIOGRAPHY;  Surgeon: Antonieta Iba, MD;  Location: [facility] INVASIVE CV LAB;  Service: Cardiovascular;  Laterality: N/A;    Allergies  No Known Allergies  History of Present Illness    67 year old male with a history of type 2 diabetes mellitus, hypertension, hyperlipidemia, obesity, and prior ocular strokes.  He was admitted to [facility] on February 26 after developing substernal chest pain associated dyspnea, nausea, and diaphoresis.  His wife called EMS and  he was found to have a left bundle branch block, chronicity unknown.  He ruled in for non-STEMI at [facility], peaking his troponin at 6.69.  We saw him in consultation and he underwent diagnostic catheterization revealing moderate to severe diffuse, small vessel coronary artery disease with an EF of less than 20% by ventriculography.  Echocardiogram showed an EF of 20-25%.  Post catheterization, he had acute pulmonary edema and required IV diuresis.  Following diuresis, he felt much better.  He was subsequently discharged on aspirin, statin, beta-blocker, ARB, nitrate, and a low-dose of Lasix.  Since his discharge, he is continued to do well.  He has been walking some in and around his house without significant limitations.  He has had no recurrence of chest pain and denies dyspnea, palpitations, PND, orthopnea, dizziness, syncope, edema, or early satiety.  He is tolerating his medications well.  Home Medications    Prior to Admission medications   Medication Sig Start Date End Date Taking? Authorizing Provider  aspirin EC 325 MG tablet Take 325 mg by mouth daily.    [provider]  atorvastatin (LIPITOR) 40 MG tablet Take 40 mg by mouth daily.    [provider]  carvedilol (COREG) 6.25 MG tablet Take 1 tablet (6.25 mg total) by mouth 2 (two) times daily with a meal. 03/06/17   Altamese Dilling, MD  citalopram (CELEXA) 40 MG tablet Take 40 mg by mouth daily.  [provider]  clopidogrel (PLAVIX) 75 MG tablet Take 1 tablet (75 mg total) by mouth daily with breakfast. 03/07/17   Altamese Dilling, MD  furosemide (LASIX) 20 MG tablet Take 1 tablet (20 mg total) by mouth daily. 03/07/17   Altamese Dilling, MD  insulin aspart (NOVOLOG) 100 UNIT/ML injection Inject 15 Units into the skin 3 (three) times daily with meals.    [provider]  insulin NPH-regular Human (NOVOLIN 70/30) (70-30) 100 UNIT/ML injection Inject 20-50 Units into the skin 2  (two) times daily with a meal. 30 units in the morning and 20 at night 03/06/17   Altamese Dilling, MD  isosorbide mononitrate (IMDUR) 30 MG 24 hr tablet Take 0.5 tablets (15 mg total) by mouth daily. 03/07/17   Altamese Dilling, MD  losartan (COZAAR) 25 MG tablet Take 1 tablet (25 mg total) by mouth daily. 03/07/17   Altamese Dilling, MD  metFORMIN (GLUCOPHAGE-XR) 500 MG 24 hr tablet Take 500 mg by mouth 2 (two) times daily.    [provider]  omeprazole (PRILOSEC) 20 MG capsule Take 20 mg by mouth 2 (two) times daily before a meal.    [provider]  Polyethyl Glycol-Propyl Glycol (SYSTANE) 0.4-0.3 % SOLN Apply 1 drop to eye daily.    [provider]  traMADol (ULTRAM) 50 MG tablet Take 50 mg by mouth every 8 (eight) hours as needed for pain.    [provider]    Review of Systems    He denies chest pain, palpitations, dyspnea, pnd, orthopnea, n, v, dizziness, syncope, edema, weight gain, or early satiety.  All other systems reviewed and are otherwise negative except as noted above.  Physical Exam    VS:  BP 120/70 (BP Location: Left Arm, Patient Position: Sitting, Cuff Size: Normal)   Pulse 71   Ht 5\' 8"  (1.727 m)   Wt 231 lb 4 oz (104.9 kg)   BMI 35.16 kg/m  , BMI Body mass index is 35.16 kg/m. GEN: Well nourished, well developed, in no acute distress.  HEENT: normal.  Neck: Supple, no JVD, carotid bruits, or masses. Cardiac: RRR, no murmurs, rubs, or gallops. No clubbing, cyanosis, edema.  Radials/DP/PT 2+ and equal bilaterally.  Respiratory:  Respirations regular and unlabored, clear to auscultation bilaterally. GI: Soft, nontender, nondistended, BS + x 4. MS: no deformity or atrophy. Skin: warm and dry, no rash. Neuro:  Strength and sensation are intact. Psych: Normal affect.  Accessory Clinical Findings    ECG -regular sinus rhythm/sinus arrhythmia, 71, left axis deviation, left bundle branch block-no acute ST or T  changes.  Assessment & Plan    1.  Non-ST segment elevation myocardial infarction, subsequent episode of care/coronary artery disease: Status post recent hospitalization with chest pain and dyspnea and finding of elevated troponins.  Catheterization revealed diffuse multivessel/small vessel coronary artery disease without a particular target for intervention.  Medical therapy was recommended.  Post catheterization was complicated by pulmonary edema requiring diuresis.  He has been stable since discharge and denies chest pain or dyspnea.  He has been tolerating limited activity just fine.  He remains on aspirin 81 mg, Lipitor 80 mg, carvedilol 6.25 mg twice daily, Plavix 75 mg daily, Imdur 30 mg daily, and losartan 25 mg daily.  I have provided him with prescription for several nitroglycerin.  I answered all of his and his wife's questions today and we spoke at length with regards to medical management of CAD and also CHF.  He is interested  in cardiac rehabilitation and does plan to enroll.  2.  Ischemic cardiomyopathy/chronic systolic congestive heart failure: EF 20-25% during hospitalization.  He is euvolemic on exam today   Has been doing well with stable weights at home.  He is tolerating beta-blocker, ARB, and low-dose Lasix therapy well.  We discussed management of cardiomyopathy and heart failure at length including medical management, sodium avoidance, daily weights, and symptom reporting.  I would like to transition him over to [facility].  His blood pressure is stable today.  I have provided him with a paper prescription for Sherryll Burger so that they may look into pricing at their local pharmacy.  He will continue losartan until he runs out of what he has and then fill the prescription for Entresto if it is financially feasible.  I will check a basic metabolic panel today.  I would likely look to add Spironolactone therapy to his regimen when I see him back in 1 month.  3.  Essential hypertension:  Stable.  4.  Hyperlipidemia: He is on Lipitor 80 with LDL of 36.  LFTs were within normal limits during hospitalization.  5.  Type 2 diabetes mellitus: Hemoglobin A1c 7.1 on February 27.  He is on insulin and metformin.  We did briefly discuss potentially adding Jardiance in the setting of cardiomyopathy and heart failure.  As we are currently focusing on heart failure management and potentially adding Entresto, will hold off on Jardiance at this time but will need to keep this in mind in the future.  6.  Acute kidney injury: This occurred during hospitalization in the setting of IV diuresis.  He is now on Lasix 20 mg daily.  Following up basic metabolic panel today.  7.  Disposition: Basic metabolic panel today.  Follow-up in clinic in 1 month or sooner if necessary.   Nicolasa Ducking, NP 03/09/2017, 5:06 PM

## 2017-03-09 NOTE — Patient Instructions (Addendum)
Medication Instructions:  Your physician has recommended you make the following change in your medication:  1- DECREASE Aspirin to 81 mg by mouth once a day. 2- Once your losartan runs out, START Entresto 24-26 mg by mouth two times a day.   Labwork: Your physician recommends that you return for lab work in: [facility].   Testing/Procedures: none  Follow-Up: Your physician recommends that you schedule a follow-up appointment in: 1 MONTH WITH DR Mariah Milling OR [facility].   If you need a refill on your cardiac medications before your next appointment, please call your pharmacy.   Medication Samples have been provided to the patient.  Drug name: ENTRESTO       Strength: 24-26 MG        Qty: 1 BOX  LOT: [id number]  Exp.Date: SEP 2020.

## 2017-03-10 LAB — BASIC METABOLIC PANEL
BUN / CREAT RATIO: 21 (ref 10–24)
BUN: 26 mg/dL (ref 8–27)
CO2: 20 mmol/L (ref 20–29)
Calcium: 9.2 mg/dL (ref 8.6–10.2)
Chloride: 102 mmol/L (ref 96–106)
Creatinine, Ser: 1.26 mg/dL (ref 0.76–1.27)
GFR calc non Af Amer: 59 mL/min/{1.73_m2} — ABNORMAL LOW (ref >59)
GFR, EST AFRICAN AMERICAN: 68 mL/min/{1.73_m2} (ref >59)
GLUCOSE: 84 mg/dL (ref 65–99)
POTASSIUM: 4.9 mmol/L (ref 3.5–5.2)
SODIUM: 139 mmol/L (ref 134–144)

## 2017-03-22 ENCOUNTER — Encounter: Payer: Self-pay | Admitting: *Deleted

## 2017-03-22 ENCOUNTER — Encounter: Payer: Medicare HMO | Attending: Cardiovascular Disease | Admitting: *Deleted

## 2017-03-22 VITALS — Ht 69.75 in | Wt 227.4 lb

## 2017-03-22 DIAGNOSIS — I255 Ischemic cardiomyopathy: Secondary | ICD-10-CM | POA: Insufficient documentation

## 2017-03-22 DIAGNOSIS — Z7982 Long term (current) use of aspirin: Secondary | ICD-10-CM | POA: Insufficient documentation

## 2017-03-22 DIAGNOSIS — Z8673 Personal history of transient ischemic attack (TIA), and cerebral infarction without residual deficits: Secondary | ICD-10-CM | POA: Insufficient documentation

## 2017-03-22 DIAGNOSIS — Z87891 Personal history of nicotine dependence: Secondary | ICD-10-CM | POA: Insufficient documentation

## 2017-03-22 DIAGNOSIS — I10 Essential (primary) hypertension: Secondary | ICD-10-CM | POA: Insufficient documentation

## 2017-03-22 DIAGNOSIS — I2510 Atherosclerotic heart disease of native coronary artery without angina pectoris: Secondary | ICD-10-CM | POA: Insufficient documentation

## 2017-03-22 DIAGNOSIS — Z79899 Other long term (current) drug therapy: Secondary | ICD-10-CM | POA: Insufficient documentation

## 2017-03-22 DIAGNOSIS — I214 Non-ST elevation (NSTEMI) myocardial infarction: Secondary | ICD-10-CM

## 2017-03-22 DIAGNOSIS — E119 Type 2 diabetes mellitus without complications: Secondary | ICD-10-CM | POA: Insufficient documentation

## 2017-03-22 DIAGNOSIS — Z794 Long term (current) use of insulin: Secondary | ICD-10-CM | POA: Insufficient documentation

## 2017-03-22 DIAGNOSIS — I447 Left bundle-branch block, unspecified: Secondary | ICD-10-CM | POA: Insufficient documentation

## 2017-03-22 DIAGNOSIS — E785 Hyperlipidemia, unspecified: Secondary | ICD-10-CM | POA: Insufficient documentation

## 2017-03-22 NOTE — Patient Instructions (Signed)
Patient Instructions  Patient Details  Name: Adam Douglas MRN: [id number] Date of Birth: [birth year] Referring Provider:  Antonieta Iba, MD  Below are your personal goals for exercise, nutrition, and risk factors. Our goal is to help you stay on track towards obtaining and maintaining these goals. We will be discussing your progress on these goals with you throughout the program.  Initial Exercise Prescription: Initial Exercise Prescription - 03/22/17 1400      Date of Initial Exercise RX and Referring Provider   Date  03/22/17    Referring Provider  Julien Nordmann MD      Treadmill   MPH  1.8    Grade  0.5    Minutes  15    METs  2.5      Recumbant Bike   Level  1    RPM  50    Watts  14    Minutes  15    METs  2      NuStep   Level  1    SPM  80    Minutes  15    METs  2      Prescription Details   Frequency (times per week)  3    Duration  Progress to 45 minutes of aerobic exercise without signs/symptoms of physical distress      Intensity   THRR 40-80% of Max Heartrate  100-135    Ratings of Perceived Exertion  11-13    Perceived Dyspnea  0-4      Progression   Progression  Continue to progress workloads to maintain intensity without signs/symptoms of physical distress.      Resistance Training   Training Prescription  Yes    Weight  3 lbs    Reps  10-15       Exercise Goals: Frequency: Be able to perform aerobic exercise two to three times per week in program working toward 2-5 days per week of home exercise.  Intensity: Work with a perceived exertion of 11 (fairly light) - 15 (hard) while following your exercise prescription.  We will make changes to your prescription with you as you progress through the program.   Duration: Be able to do 30 to 45 minutes of continuous aerobic exercise in addition to a 5 minute warm-up and a 5 minute cool-down routine.   Nutrition Goals: Your personal nutrition goals will be established when you do your  nutrition analysis with the dietician.  The following are general nutrition guidelines to follow: Cholesterol < 200mg /day Sodium < 1500mg /day Fiber: Men over 50 yrs - 30 grams per day  Personal Goals: Personal Goals and Risk Factors at Admission - 03/22/17 1325      Core Components/Risk Factors/Patient Goals on Admission    Weight Management  Yes;Weight Loss    Intervention  Weight Management: Develop a combined nutrition and exercise program designed to reach desired caloric intake, while maintaining appropriate intake of nutrient and fiber, sodium and fats, and appropriate energy expenditure required for the weight goal.;Weight Management: Provide education and appropriate resources to help participant work on and attain dietary goals.;Weight Management/Obesity: Establish reasonable short term and long term weight goals.    Admit Weight  226 lb (102.5 kg)    Goal Weight: Short Term  222 lb (100.7 kg)    Goal Weight: Long Term  199 lb (90.3 kg)    Expected Outcomes  Short Term: Continue to assess and modify interventions until short term weight is achieved;Long Term: Adherence to  nutrition and physical activity/exercise program aimed toward attainment of established weight goal;Weight Loss: Understanding of general recommendations for a balanced deficit meal plan, which promotes 1-2 lb weight loss per week and includes a negative energy balance of [phone] kcal/d;Understanding recommendations for meals to include 15-35% energy as protein, 25-35% energy from fat, 35-60% energy from carbohydrates, less than 200mg  of dietary cholesterol, 20-35 gm of total fiber daily;Understanding of distribution of calorie intake throughout the day with the consumption of 4-5 meals/snacks    Diabetes  Yes    Intervention  Provide education about signs/symptoms and action to take for hypo/hyperglycemia.;Provide education about proper nutrition, including hydration, and aerobic/resistive exercise prescription along  with prescribed medications to achieve blood glucose in normal ranges: Fasting glucose 65-99 mg/dL    Expected Outcomes  Short Term: Participant verbalizes understanding of the signs/symptoms and immediate care of hyper/hypoglycemia, proper foot care and importance of medication, aerobic/resistive exercise and nutrition plan for blood glucose control.;Long Term: Attainment of HbA1C < 7%.    Heart Failure  Yes    Intervention  Provide a combined exercise and nutrition program that is supplemented with education, support and counseling about heart failure. Directed toward relieving symptoms such as shortness of breath, decreased exercise tolerance, and extremity edema.    Expected Outcomes  Improve functional capacity of life    Hypertension  Yes    Intervention  Provide education on lifestyle modifcations including regular physical activity/exercise, weight management, moderate sodium restriction and increased consumption of fresh fruit, vegetables, and low fat dairy, alcohol moderation, and smoking cessation.;Monitor prescription use compliance.    Expected Outcomes  Short Term: Continued assessment and intervention until BP is < 140/24mm HG in hypertensive participants. < 130/93mm HG in hypertensive participants with diabetes, heart failure or chronic kidney disease.;Long Term: Maintenance of blood pressure at goal levels.    Lipids  Yes    Intervention  Provide education and support for participant on nutrition & aerobic/resistive exercise along with prescribed medications to achieve LDL 70mg , HDL >40mg .    Expected Outcomes  Short Term: Participant states understanding of desired cholesterol values and is compliant with medications prescribed. Participant is following exercise prescription and nutrition guidelines.;Long Term: Cholesterol controlled with medications as prescribed, with individualized exercise RX and with personalized nutrition plan. Value goals: LDL < 70mg , HDL > 40 mg.    Stress  Yes  Seung has not been able to participate in yard work or perform househole activities like he used to due to his 3 strokes and other health issues    Intervention  Offer individual and/or small group education and counseling on adjustment to heart disease, stress management and health-related lifestyle change. Teach and support self-help strategies.;Refer participants experiencing significant psychosocial distress to appropriate mental health specialists for further evaluation and treatment. When possible, include family members and significant others in education/counseling sessions.    Expected Outcomes  Short Term: Participant demonstrates changes in health-related behavior, relaxation and other stress management skills, ability to obtain effective social support, and compliance with psychotropic medications if prescribed.;Long Term: Emotional wellbeing is indicated by absence of clinically significant psychosocial distress or social isolation.       Tobacco Use Initial Evaluation: Social History   Tobacco Use  Smoking Status Former Smoker  . Packs/day: 1.00  . Years: 15.00  . Pack years: 15.00  Smokeless Tobacco Never Used  Tobacco Comment   Quit in his 75's.    Exercise Goals and Review: Exercise Goals    Row Name 03/22/17  1408             Exercise Goals   Increase Physical Activity  Yes       Intervention  Provide advice, education, support and counseling about physical activity/exercise needs.;Develop an individualized exercise prescription for aerobic and resistive training based on initial evaluation findings, risk stratification, comorbidities and participant's personal goals.       Expected Outcomes  Short Term: Attend rehab on a regular basis to increase amount of physical activity.;Long Term: Add in home exercise to make exercise part of routine and to increase amount of physical activity.;Long Term: Exercising regularly at least 3-5 days a week.       Increase Strength and  Stamina  Yes       Intervention  Provide advice, education, support and counseling about physical activity/exercise needs.;Develop an individualized exercise prescription for aerobic and resistive training based on initial evaluation findings, risk stratification, comorbidities and participant's personal goals.       Expected Outcomes  Short Term: Increase workloads from initial exercise prescription for resistance, speed, and METs.;Short Term: Perform resistance training exercises routinely during rehab and add in resistance training at home;Long Term: Improve cardiorespiratory fitness, muscular endurance and strength as measured by increased METs and functional capacity ( )       Able to understand and use rate of perceived exertion (RPE) scale  Yes       Intervention  Provide education and explanation on how to use RPE scale       Expected Outcomes  Short Term: Able to use RPE daily in rehab to express subjective intensity level;Long Term:  Able to use RPE to guide intensity level when exercising independently       Knowledge and understanding of Target Heart Rate Range (THRR)  Yes       Intervention  Provide education and explanation of THRR including how the numbers were predicted and where they are located for reference       Expected Outcomes  Short Term: Able to state/look up THRR;Long Term: Able to use THRR to govern intensity when exercising independently;Short Term: Able to use daily as guideline for intensity in rehab       Able to check pulse independently  Yes       Intervention  Provide education and demonstration on how to check pulse in carotid and radial arteries.;Review the importance of being able to check your own pulse for safety during independent exercise       Expected Outcomes  Short Term: Able to explain why pulse checking is important during independent exercise;Long Term: Able to check pulse independently and accurately       Understanding of Exercise Prescription  Yes        Intervention  Provide education, explanation, and written materials on patient's individual exercise prescription       Expected Outcomes  Short Term: Able to explain program exercise prescription;Long Term: Able to explain home exercise prescription to exercise independently          Copy of goals given to participant.

## 2017-03-22 NOTE — Progress Notes (Signed)
Cardiac Individual Treatment Plan  Patient Details  Name: Adam Douglas MRN: [id number] Date of Birth: 1950-10-30 Referring Provider:     Cardiac Rehab from 03/22/2017 in [facility] Cardiac and Pulmonary Rehab  Referring Provider  Ida Rogue MD      Initial Encounter Date:    Cardiac Rehab from 03/22/2017 in [facility] Cardiac and Pulmonary Rehab  Date  03/22/17  Referring Provider  Ida Rogue MD      Visit Diagnosis: NSTEMI (non-ST elevated myocardial infarction) [facility])  Patient's Home Medications on Admission:  Current Outpatient Medications:  .  aspirin EC 81 MG tablet, Take 1 tablet (81 mg total) by mouth daily., Disp: 90 tablet, Rfl: 3 .  atorvastatin (LIPITOR) 40 MG tablet, Take 80 mg by mouth daily., Disp: , Rfl:  .  carvedilol (COREG) 6.25 MG tablet, Take 1 tablet (6.25 mg total) by mouth 2 (two) times daily with a meal., Disp: 180 tablet, Rfl: 2 .  citalopram (CELEXA) 40 MG tablet, Take 40 mg by mouth daily., Disp: , Rfl:  .  clopidogrel (PLAVIX) 75 MG tablet, Take 1 tablet (75 mg total) by mouth daily with breakfast., Disp: 90 tablet, Rfl: 2 .  furosemide (LASIX) 20 MG tablet, Take 1 tablet (20 mg total) by mouth daily., Disp: 90 tablet, Rfl: 2 .  insulin aspart (NOVOLOG) 100 UNIT/ML injection, Inject 15 Units into the skin 3 (three) times daily with meals., Disp: , Rfl:  .  insulin NPH-regular Human (NOVOLIN 70/30) (70-30) 100 UNIT/ML injection, Inject 20-50 Units into the skin 2 (two) times daily with a meal. 30 units in the morning and 20 at night, Disp: 10 mL, Rfl: 11 .  isosorbide mononitrate (IMDUR) 30 MG 24 hr tablet, Take 0.5 tablets (15 mg total) by mouth daily., Disp: 45 tablet, Rfl: 2 .  metFORMIN (GLUCOPHAGE-XR) 500 MG 24 hr tablet, Take 500 mg by mouth 2 (two) times daily., Disp: , Rfl:  .  omeprazole (PRILOSEC) 20 MG capsule, Take 20 mg by mouth 2 (two) times daily before a meal., Disp: , Rfl:  .  sacubitril-valsartan (ENTRESTO) 24-26 MG, Take 1 tablet by mouth 2  (two) times daily., Disp: 180 tablet, Rfl: 3 .  traMADol (ULTRAM) 50 MG tablet, Take 50 mg by mouth every 8 (eight) hours as needed for pain., Disp: , Rfl:  .  losartan (COZAAR) 25 MG tablet, Take 1 tablet (25 mg total) by mouth daily., Disp: 30 tablet, Rfl: 0 .  Polyethyl Glycol-Propyl Glycol (SYSTANE) 0.4-0.3 % SOLN, Apply 1 drop to eye daily., Disp: , Rfl:   Past Medical History: Past Medical History:  Diagnosis Date  . CAD (coronary artery disease)    a. 02/2017 NSTEMI/Cath: LM 40d, LAD 33m 769m, D2 small w/ sev prox/m dzs, LCX nl, OM1 sev diff dzs - small vessel, OM2 80-90p/m, RCA dominant 3043mPDA 50, EF <20%-->Med Rx.  . Diabetes mellitus without complication ([facility] . HFrEF (heart failure with reduced ejection fraction) [place] [facility]  a. 02/2017 Echo: EF 20-25%.  . HMarland Kitchenperlipidemia   . Hypertension   . Ischemic cardiomyopathy    a. 02/2017 Echo: EF 20-25%, diff HK, Gr2 DD, mild MR.  . LBBB (left bundle branch block)    a. Noted 02/2017. Pt denies any known prior history of LBBB (not present on 2014 [facility] ECG interpretation).  . Morbid obesity ([facility] . [facility]  [facility]. [facility]    Tobacco Use: Social History   Tobacco Use  Smoking Status Former Smoker  . Packs/day: 1.00  . Years: 15.00  . Pack years: 15.00  Smokeless Tobacco Never Used  Tobacco Comment   Quit in his 31's.    Labs: Recent Review Flowsheet Data    Labs for ITP Cardiac and Pulmonary Rehab Latest Ref Rng & Units 03/02/2017 03/03/2017 03/04/2017   Cholestrol 0 - 200 mg/dL - - 94   LDLCALC 0 - 99 mg/dL - - 36   HDL >40 mg/dL - - 35(L)   Trlycerides <150 mg/dL - - 117   Hemoglobin A1c 4.8 - 5.6 % - 7.1(H) -   HCO3 20.0 - 28.0 mmol/L 22.1 - -   ACIDBASEDEF 0.0 - 2.0 mmol/L 1.7 - -   O2SAT % 91.1 - -       Exercise Target Goals: Date: 03/22/17  Exercise Program Goal: Individual exercise prescription set using results from initial 6 min  walk test and THRR while considering  patient's activity barriers and safety.   Exercise Prescription Goal: Initial exercise prescription builds to 30-45 minutes a day of aerobic activity, 2-3 days per week.  Home exercise guidelines will be given to patient during program as part of exercise prescription that the participant will acknowledge.  Activity Barriers & Risk Stratification: Activity Barriers & Cardiac Risk Stratification - 03/22/17 1333      Activity Barriers & Cardiac Risk Stratification   Activity Barriers  Arthritis;Shortness of Breath;Deconditioning;Muscular Weakness    Cardiac Risk Stratification  High       6 Minute Walk: 6 Minute Walk    Row Name 03/22/17 1404         6 Minute Walk   Phase  Initial     Distance  1085 feet     Walk Time  6 minutes     # of Rest Breaks  0     MPH  2.06     METS  2.41     RPE  13     VO2 Peak  8.45     Symptoms  No     Resting HR  5 bpm     Resting BP  126/72     Resting Oxygen Saturation   97 %     Exercise Oxygen Saturation  during 6 min walk  98 %     Max Ex. HR  88 bpm     Max Ex. BP  152/64     2 Minute Post BP  134/64        Oxygen Initial Assessment:   Oxygen Re-Evaluation:   Oxygen Discharge (Final Oxygen Re-Evaluation):   Initial Exercise Prescription: Initial Exercise Prescription - 03/22/17 1400      Date of Initial Exercise RX and Referring Provider   Date  03/22/17    Referring Provider  Ida Rogue MD      Treadmill   MPH  1.8    Grade  0.5    Minutes  15    METs  2.5      Recumbant Bike   Level  1    RPM  50    Watts  14    Minutes  15    METs  2      NuStep   Level  1    SPM  80    Minutes  15    METs  2      Prescription Details   Frequency (  times per week)  3    Duration  Progress to 45 minutes of aerobic exercise without signs/symptoms of physical distress      Intensity   THRR 40-80% of Max Heartrate  100-135    Ratings of Perceived Exertion  11-13    Perceived  Dyspnea  0-4      Progression   Progression  Continue to progress workloads to maintain intensity without signs/symptoms of physical distress.      Resistance Training   Training Prescription  Yes    Weight  3 lbs    Reps  10-15       Perform Capillary Blood Glucose checks as needed.  Exercise Prescription Changes: Exercise Prescription Changes    Row Name 03/22/17 1300             Response to Exercise   Blood Pressure (Admit)  126/72       Blood Pressure (Exercise)  152/64       Blood Pressure (Exit)  134/64       Heart Rate (Admit)  64 bpm       Heart Rate (Exercise)  88 bpm       Heart Rate (Exit)  68 bpm       Oxygen Saturation (Admit)  97 %       Oxygen Saturation (Exercise)  98 %       Rating of Perceived Exertion (Exercise)  13       Symptoms  none       Comments  walk test results          Exercise Comments:   Exercise Goals and Review: Exercise Goals    Row Name 03/22/17 1408             Exercise Goals   Increase Physical Activity  Yes       Intervention  Provide advice, education, support and counseling about physical activity/exercise needs.;Develop an individualized exercise prescription for aerobic and resistive training based on initial evaluation findings, risk stratification, comorbidities and participant's personal goals.       Expected Outcomes  Short Term: Attend rehab on a regular basis to increase amount of physical activity.;Long Term: Add in home exercise to make exercise part of routine and to increase amount of physical activity.;Long Term: Exercising regularly at least 3-5 days a week.       Increase Strength and Stamina  Yes       Intervention  Provide advice, education, support and counseling about physical activity/exercise needs.;Develop an individualized exercise prescription for aerobic and resistive training based on initial evaluation findings, risk stratification, comorbidities and participant's personal goals.       Expected  Outcomes  Short Term: Increase workloads from initial exercise prescription for resistance, speed, and METs.;Short Term: Perform resistance training exercises routinely during rehab and add in resistance training at home;Long Term: Improve cardiorespiratory fitness, muscular endurance and strength as measured by increased METs and functional capacity (6MWT)       Able to understand and use rate of perceived exertion (RPE) scale  Yes       Intervention  Provide education and explanation on how to use RPE scale       Expected Outcomes  Short Term: Able to use RPE daily in rehab to express subjective intensity level;Long Term:  Able to use RPE to guide intensity level when exercising independently       Knowledge and understanding of Target Heart Rate Range (THRR)  Yes  Intervention  Provide education and explanation of THRR including how the numbers were predicted and where they are located for reference       Expected Outcomes  Short Term: Able to state/look up THRR;Long Term: Able to use THRR to govern intensity when exercising independently;Short Term: Able to use daily as guideline for intensity in rehab       Able to check pulse independently  Yes       Intervention  Provide education and demonstration on how to check pulse in carotid and radial arteries.;Review the importance of being able to check your own pulse for safety during independent exercise       Expected Outcomes  Short Term: Able to explain why pulse checking is important during independent exercise;Long Term: Able to check pulse independently and accurately       Understanding of Exercise Prescription  Yes       Intervention  Provide education, explanation, and written materials on patient's individual exercise prescription       Expected Outcomes  Short Term: Able to explain program exercise prescription;Long Term: Able to explain home exercise prescription to exercise independently          Exercise Goals Re-Evaluation  :   Discharge Exercise Prescription (Final Exercise Prescription Changes): Exercise Prescription Changes - 03/22/17 1300      Response to Exercise   Blood Pressure (Admit)  126/72    Blood Pressure (Exercise)  152/64    Blood Pressure (Exit)  134/64    Heart Rate (Admit)  64 bpm    Heart Rate (Exercise)  88 bpm    Heart Rate (Exit)  68 bpm    Oxygen Saturation (Admit)  97 %    Oxygen Saturation (Exercise)  98 %    Rating of Perceived Exertion (Exercise)  13    Symptoms  none    Comments  walk test results       Nutrition:  Target Goals: Understanding of nutrition guidelines, daily intake of sodium <1540m, cholesterol <2040m calories 30% from fat and 7% or less from saturated fats, daily to have 5 or more servings of fruits and vegetables.  Biometrics: Pre Biometrics - 03/22/17 1408      Pre Biometrics   Height  5' 9.75" (1.772 m)    Weight  227 lb 6.4 oz (103.1 kg)    Waist Circumference  43.5 inches    Hip Circumference  40.25 inches    Waist to Hip Ratio  1.08 %    BMI (Calculated)  32.85    Single Leg Stand  1.1 seconds        Nutrition Therapy Plan and Nutrition Goals: Nutrition Therapy & Goals - 03/22/17 1329      Intervention Plan   Intervention  Prescribe, educate and counsel regarding individualized specific dietary modifications aiming towards targeted core components such as weight, hypertension, lipid management, diabetes, heart failure and other comorbidities.;Nutrition handout(s) given to patient.    Expected Outcomes  Short Term Goal: Understand basic principles of dietary content, such as calories, fat, sodium, cholesterol and nutrients.;Short Term Goal: A plan has been developed with personal nutrition goals set during dietitian appointment.;Long Term Goal: Adherence to prescribed nutrition plan.       Nutrition Assessments: Nutrition Assessments - 03/22/17 1104      MEDFICTS Scores   Pre Score  6       Nutrition Goals  Re-Evaluation:   Nutrition Goals Discharge (Final Nutrition Goals Re-Evaluation):   Psychosocial: Target Goals:  Acknowledge presence or absence of significant depression and/or stress, maximize coping skills, provide positive support system. Participant is able to verbalize types and ability to use techniques and skills needed for reducing stress and depression.   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 This and his other health issues make it hard to participate in activities around the house that he used to do. 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 Setting desired goal for each stressor or current issue identified.;Long Term Goal: Stressors or current issues are controlled or eliminated.;Short Term goal: Identification and review with participant of any Quality of Life or Depression concerns found by scoring the questionnaire.;Long Term goal: The participant improves quality of Life and PHQ9 Scores as seen by post scores and/or verbalization of changes       Quality of Life Scores:  Quality of Life - 03/22/17 1103      Quality of Life Scores   Health/Function Pre  22.07 %     Socioeconomic Pre  25 %    Psych/Spiritual Pre  30 %    Family Pre  26 %    GLOBAL Pre  24.97 %      Scores of 19 and below usually indicate a poorer quality of life in these areas.  A difference of  2-3 points is a clinically meaningful difference.  A difference of 2-3 points in the total score of the Quality of Life Index has been associated with significant improvement in overall quality of life, self-image, physical symptoms, and general health in studies assessing change in quality of life.  PHQ-9: Recent Review Flowsheet Data    Depression screen [facility] 2/9 03/22/2017   Decreased Interest 0   Down, Depressed, Hopeless 0   PHQ - 2 Score 0   Altered sleeping 0   Tired, decreased energy 1   Change in appetite 0   Feeling bad or failure about yourself  0   Trouble concentrating 1   Moving slowly or fidgety/restless 1   Suicidal thoughts 0   PHQ-9 Score 3   Difficult doing work/chores Somewhat difficult     Interpretation of Total Score  Total Score Depression Severity:  1-4 = Minimal depression, 5-9 = Mild depression, 10-14 = Moderate depression, 15-19 = Moderately severe depression, 20-27 = Severe depression   Psychosocial Evaluation and Intervention:   Psychosocial Re-Evaluation:   Psychosocial Discharge (Final Psychosocial Re-Evaluation):   Vocational Rehabilitation: Provide vocational rehab assistance to qualifying candidates.   Vocational Rehab Evaluation & Intervention: Vocational Rehab - 03/22/17 1332      Initial Vocational Rehab Evaluation & Intervention   Assessment shows need for Vocational Rehabilitation  No       Education:  Education Goals: Education classes will be provided on a variety of topics geared toward better understanding of heart health and risk factor modification. Participant will state understanding/return demonstration of topics presented as noted by education test scores.  Learning Barriers/Preferences: Learning Barriers/Preferences -  03/22/17 1332      Learning Barriers/Preferences   Learning Barriers  Hearing;Sight;Reading    Learning Preferences  Individual Instruction;Verbal Instruction       Education Topics:  AED/CPR: - Group verbal and written instruction with the use of models to demonstrate the basic use of the AED with the basic ABC's of resuscitation.   General Nutrition Guidelines/Fats and Fiber: -Group instruction provided by verbal, written material, models and posters to present the general guidelines for heart healthy nutrition. Gives an explanation and review of dietary fats and fiber.   Controlling Sodium/Reading Food Labels: -Group verbal and written material supporting the discussion of sodium use in heart healthy nutrition. Review and explanation with models, verbal and written materials for utilization of the food label.   Exercise Physiology & General Exercise Guidelines: - Group verbal and written instruction with models to review the exercise physiology of the cardiovascular system and associated critical values. Provides general exercise guidelines with specific guidelines to those with heart or lung disease.    Aerobic Exercise & Resistance Training: - Gives group verbal and written instruction on the various components of exercise. Focuses on aerobic and resistive training programs and the benefits of this training and how to safely progress through these programs..   Flexibility, Balance, Mind/Body Relaxation: Provides group verbal/written instruction on the benefits of flexibility and balance training, including mind/body exercise modes such as yoga, pilates and tai chi.  Demonstration and skill practice provided.   Stress and Anxiety: - Provides group verbal and written instruction about the health risks of elevated stress and causes of high stress.  Discuss the correlation between heart/lung disease and anxiety and treatment options. Review healthy ways to manage with stress and  anxiety.   Depression: - Provides group verbal and written instruction on the correlation between heart/lung disease and depressed mood, treatment options, and the stigmas associated with seeking treatment.   Anatomy & Physiology of the Heart: - Group verbal and written instruction and models provide basic cardiac anatomy and physiology, with the coronary electrical and arterial systems. Review of Valvular disease and Heart Failure   Cardiac Procedures: - Group verbal and written instruction to review commonly prescribed medications for heart disease. Reviews the medication, class of the drug, and side effects. Includes the steps to properly store meds and maintain the prescription regimen. (beta blockers and nitrates)   Cardiac Medications I: - Group verbal and written instruction to review commonly prescribed medications for heart disease. Reviews the medication, class of the drug, and side effects. Includes the steps to properly store meds and maintain the prescription regimen.   Cardiac Medications II: -Group verbal and written instruction to review commonly prescribed medications for heart disease. Reviews the medication, class of the drug, and side effects. (all other drug classes)    Go Sex-Intimacy & Heart Disease, Get SMART - Goal Setting: - Group verbal and written instruction through game format to discuss heart disease and the return to sexual intimacy. Provides group verbal and written material to discuss and apply goal setting through the application of the S.M.A.R.T. Method.   Other Matters of the Heart: - Provides group verbal, written materials and models to describe Stable Angina and Peripheral Artery. Includes description of the disease  process and treatment options available to the cardiac patient.   Exercise & Equipment Safety: - Individual verbal instruction and demonstration of equipment use and safety with use of the equipment.   Cardiac Rehab from 03/22/2017  in [facility] Cardiac and Pulmonary Rehab  Date  03/22/17  Educator  [facility]  Instruction Review Code  1- Verbalizes Understanding      Infection Prevention: - Provides verbal and written material to individual with discussion of infection control including proper hand washing and proper equipment cleaning during exercise session.   Cardiac Rehab from 03/22/2017 in [facility] Cardiac and Pulmonary Rehab  Date  03/22/17  Educator  [facility]  Instruction Review Code  1- Verbalizes Understanding      Falls Prevention: - Provides verbal and written material to individual with discussion of falls prevention and safety.   Cardiac Rehab from 03/22/2017 in [facility] Cardiac and Pulmonary Rehab  Date  03/22/17  Educator  [facility]  Instruction Review Code  1- Verbalizes Understanding      Diabetes: - Individual verbal and written instruction to review signs/symptoms of diabetes, desired ranges of glucose level fasting, after meals and with exercise. Acknowledge that pre and post exercise glucose checks will be done for 3 sessions at entry of program.   Cardiac Rehab from 03/22/2017 in [facility] Cardiac and Pulmonary Rehab  Date  03/22/17  Educator  [facility]  Instruction Review Code  1- Verbalizes Understanding      Know Your Numbers and Risk Factors: -Group verbal and written instruction about important numbers in your health.  Discussion of what are risk factors and how they play a role in the disease process.  Review of Cholesterol, Blood Pressure, Diabetes, and BMI and the role they play in your overall health.   Sleep Hygiene: -Provides group verbal and written instruction about how sleep can affect your health.  Define sleep hygiene, discuss sleep cycles and impact of sleep habits. Review good sleep hygiene tips.    Other: -Provides group and verbal instruction on various topics (see comments)   Knowledge Questionnaire Score: Knowledge Questionnaire Score - 03/22/17 1104      Knowledge Questionnaire Score   Pre Score   21/28 correct answers reviewed with Yuchen       Core Components/Risk Factors/Patient Goals at Admission: Personal Goals and Risk Factors at Admission - 03/22/17 1325      Core Components/Risk Factors/Patient Goals on Admission    Weight Management  Yes;Weight Loss    Intervention  Weight Management: Develop a combined nutrition and exercise program designed to reach desired caloric intake, while maintaining appropriate intake of nutrient and fiber, sodium and fats, and appropriate energy expenditure required for the weight goal.;Weight Management: Provide education and appropriate resources to help participant work on and attain dietary goals.;Weight Management/Obesity: Establish reasonable short term and long term weight goals.    Admit Weight  226 lb (102.5 kg)    Goal Weight: Short Term  222 lb (100.7 kg)    Goal Weight: Long Term  199 lb (90.3 kg)    Expected Outcomes  Short Term: Continue to assess and modify interventions until short term weight is achieved;Long Term: Adherence to nutrition and physical activity/exercise program aimed toward attainment of established weight goal;Weight Loss: Understanding of general recommendations for a balanced deficit meal plan, which promotes 1-2 lb weight loss per week and includes a negative energy balance of [phone] kcal/d;Understanding recommendations for meals to include 15-35% energy as protein, 25-35% energy from fat, 35-60% energy from carbohydrates, less  than 273m of dietary cholesterol, 20-35 gm of total fiber daily;Understanding of distribution of calorie intake throughout the day with the consumption of 4-5 meals/snacks    Diabetes  Yes    Intervention  Provide education about signs/symptoms and action to take for hypo/hyperglycemia.;Provide education about proper nutrition, including hydration, and aerobic/resistive exercise prescription along with prescribed medications to achieve blood glucose in normal ranges: Fasting glucose 65-99 mg/dL     Expected Outcomes  Short Term: Participant verbalizes understanding of the signs/symptoms and immediate care of hyper/hypoglycemia, proper foot care and importance of medication, aerobic/resistive exercise and nutrition plan for blood glucose control.;Long Term: Attainment of HbA1C < 7%.    Heart Failure  Yes    Intervention  Provide a combined exercise and nutrition program that is supplemented with education, support and counseling about heart failure. Directed toward relieving symptoms such as shortness of breath, decreased exercise tolerance, and extremity edema.    Expected Outcomes  Improve functional capacity of life    Hypertension  Yes    Intervention  Provide education on lifestyle modifcations including regular physical activity/exercise, weight management, moderate sodium restriction and increased consumption of fresh fruit, vegetables, and low fat dairy, alcohol moderation, and smoking cessation.;Monitor prescription use compliance.    Expected Outcomes  Short Term: Continued assessment and intervention until BP is < 140/959mHG in hypertensive participants. < 130/8072mG in hypertensive participants with diabetes, heart failure or chronic kidney disease.;Long Term: Maintenance of blood pressure at goal levels.    Lipids  Yes    Intervention  Provide education and support for participant on nutrition & aerobic/resistive exercise along with prescribed medications to achieve LDL <88m58mDL >40mg69m Expected Outcomes  Short Term: Participant states understanding of desired cholesterol values and is compliant with medications prescribed. Participant is following exercise prescription and nutrition guidelines.;Long Term: Cholesterol controlled with medications as prescribed, with individualized exercise RX and with personalized nutrition plan. Value goals: LDL < 88mg,28m > 40 mg.    Stress  Yes DelmasOluwatobilobaot been able to participate in yard work or perform househole activities like he used to  due to his 3 strokes and other health issues    Intervention  Offer individual and/or small group education and counseling on adjustment to heart disease, stress management and health-related lifestyle change. Teach and support self-help strategies.;Refer participants experiencing significant psychosocial distress to appropriate mental health specialists for further evaluation and treatment. When possible, include family members and significant others in education/counseling sessions.    Expected Outcomes  Short Term: Participant demonstrates changes in health-related behavior, relaxation and other stress management skills, ability to obtain effective social support, and compliance with psychotropic medications if prescribed.;Long Term: Emotional wellbeing is indicated by absence of clinically significant psychosocial distress or social isolation.       Core Components/Risk Factors/Patient Goals Review:    Core Components/Risk Factors/Patient Goals at Discharge (Final Review):    ITP Comments: ITP Comments    Row Name 03/22/17 1308           ITP Comments  Med Review completed. Initial ITP created. [facility]          Comments: Initial ITP

## 2017-03-25 ENCOUNTER — Ambulatory Visit: Payer: Medicare HMO

## 2017-03-26 DIAGNOSIS — I214 Non-ST elevation (NSTEMI) myocardial infarction: Secondary | ICD-10-CM

## 2017-03-26 LAB — GLUCOSE, CAPILLARY
Glucose-Capillary: 140 mg/dL — ABNORMAL HIGH (ref 65–99)
Glucose-Capillary: 166 mg/dL — ABNORMAL HIGH (ref 65–99)

## 2017-03-26 NOTE — Progress Notes (Signed)
Daily Session Note  Patient Details  Name: Adam Douglas MRN: [id number] Date of Birth: 11-16-1950 Referring Provider:     Cardiac Rehab from 03/22/2017 in [facility] Cardiac and Pulmonary Rehab  Referring Provider  Ida Rogue MD      Encounter Date: 03/26/2017  Check In: Session Check In - 03/26/17 0855      Check-In   Location  [facility]-Cardiac & Pulmonary Rehab    Staff Present  Alberteen Sam, MA, RCEP, CCRP, Exercise Physiologist;Meredith Sherryll Burger, RN Vickki Hearing, BA, ACSM CEP, Exercise Physiologist    Supervising physician immediately available to respond to emergencies  See telemetry face sheet for immediately available ER MD    Medication changes reported      No    Fall or balance concerns reported     No    Warm-up and Cool-down  Performed on first and last piece of equipment    Resistance Training Performed  Yes    VAD Patient?  No      Pain Assessment   Currently in Pain?  No/denies    Multiple Pain Sites  No          Social History   Tobacco Use  Smoking Status Former Smoker  . Packs/day: 1.00  . Years: 15.00  . Pack years: 15.00  Smokeless Tobacco Never Used  Tobacco Comment   Quit in his 22's.    Goals Met:  Independence with exercise equipment Exercise tolerated well No report of cardiac concerns or symptoms Strength training completed today  Goals Unmet:  Not Applicable  Comments: First full day of exercise!  Patient was oriented to gym and equipment including functions, settings, policies, and procedures.  Patient's individual exercise prescription and treatment plan were reviewed.  All starting workloads were established based on the results of the 6 minute walk test done at initial orientation visit.  The plan for exercise progression was also introduced and progression will be customized based on patient's performance and goals.    Dr. Emily Filbert is Medical Director for [facility].

## 2017-03-29 ENCOUNTER — Encounter: Payer: Medicare HMO | Admitting: *Deleted

## 2017-03-29 DIAGNOSIS — I214 Non-ST elevation (NSTEMI) myocardial infarction: Secondary | ICD-10-CM | POA: Diagnosis not present

## 2017-03-29 LAB — GLUCOSE, CAPILLARY
GLUCOSE-CAPILLARY: 122 mg/dL — AB (ref 65–99)
GLUCOSE-CAPILLARY: 137 mg/dL — AB (ref 65–99)

## 2017-03-29 NOTE — Progress Notes (Signed)
Daily Session Note  Patient Details  Name: Adam Douglas MRN: [id number] Date of Birth: 01/25/1950 Referring Provider:     Cardiac Rehab from 03/22/2017 in [facility] Cardiac and Pulmonary Rehab  Referring Provider  Ida Rogue MD      Encounter Date: 03/29/2017  Check In: Session Check In - 03/29/17 0842      Check-In   Location  [facility]-Cardiac & Pulmonary Rehab    Staff Present  Earlean Shawl, BS, ACSM CEP, Exercise Physiologist;Jessica Luan Pulling, MA, RCEP, CCRP, Exercise Physiologist;Susanne Bice, RN, BSN, CCRP    Supervising physician immediately available to respond to emergencies  See telemetry face sheet for immediately available ER MD    Medication changes reported      No    Fall or balance concerns reported     No    Warm-up and Cool-down  Performed on first and last piece of equipment    Resistance Training Performed  Yes    VAD Patient?  No      Pain Assessment   Currently in Pain?  No/denies    Multiple Pain Sites  No          Social History   Tobacco Use  Smoking Status Former Smoker  . Packs/day: 1.00  . Years: 15.00  . Pack years: 15.00  Smokeless Tobacco Never Used  Tobacco Comment   Quit in his 55's.    Goals Met:  Independence with exercise equipment Exercise tolerated well No report of cardiac concerns or symptoms Strength training completed today  Goals Unmet:  Not Applicable  Comments:Pt able to follow exercise prescription today without complaint.  Will continue to monitor for progression. .     Dr. Emily Filbert is Medical Director for [facility].

## 2017-03-31 ENCOUNTER — Encounter: Payer: Self-pay | Admitting: *Deleted

## 2017-03-31 DIAGNOSIS — I214 Non-ST elevation (NSTEMI) myocardial infarction: Secondary | ICD-10-CM

## 2017-03-31 LAB — GLUCOSE, CAPILLARY
GLUCOSE-CAPILLARY: 116 mg/dL — AB (ref 65–99)
Glucose-Capillary: 105 mg/dL — ABNORMAL HIGH (ref 65–99)

## 2017-03-31 NOTE — Progress Notes (Signed)
Daily Session Note  Patient Details  Name: Adam Douglas MRN: [id number] Date of Birth: Dec 19, 1950 Referring Provider:     Cardiac Rehab from 03/22/2017 in [facility] Cardiac and Pulmonary Rehab  Referring Provider  Ida Rogue MD      Encounter Date: 03/31/2017  Check In: Session Check In - 03/31/17 0729      Check-In   Location  [facility]-Cardiac & Pulmonary Rehab    Staff Present  Justin Mend RCP,RRT,BSRT;Heath Lark, RN, BSN, CCRP;Jessica Luan Pulling, MA, RCEP, CCRP, Exercise Physiologist    Supervising physician immediately available to respond to emergencies  See telemetry face sheet for immediately available ER MD    Medication changes reported      No    Fall or balance concerns reported     No    Tobacco Cessation  No Change    Warm-up and Cool-down  Performed on first and last piece of equipment    Resistance Training Performed  Yes    VAD Patient?  No      Pain Assessment   Currently in Pain?  No/denies          Social History   Tobacco Use  Smoking Status Former Smoker  . Packs/day: 1.00  . Years: 15.00  . Pack years: 15.00  Smokeless Tobacco Never Used  Tobacco Comment   Quit in his 45's.    Goals Met:  Independence with exercise equipment Exercise tolerated well No report of cardiac concerns or symptoms Strength training completed today  Goals Unmet:  Not Applicable  Comments: Pt able to follow exercise prescription today without complaint.  Will continue to monitor for progression.   Dr. Emily Filbert is Medical Director for [facility].

## 2017-03-31 NOTE — Progress Notes (Signed)
Cardiac Individual Treatment Plan  Patient Details  Name: Adam Douglas MRN: [id number] Date of Birth: 02/06/1950 Referring Provider:     Cardiac Rehab from 03/22/2017 in [facility] Cardiac and Pulmonary Rehab  Referring Provider  Ida Rogue MD      Initial Encounter Date:    Cardiac Rehab from 03/22/2017 in [facility] Cardiac and Pulmonary Rehab  Date  03/22/17  Referring Provider  Ida Rogue MD      Visit Diagnosis: NSTEMI (non-ST elevated myocardial infarction) [facility])  Patient's Home Medications on Admission:  Current Outpatient Medications:  .  aspirin EC 81 MG tablet, Take 1 tablet (81 mg total) by mouth daily., Disp: 90 tablet, Rfl: 3 .  atorvastatin (LIPITOR) 40 MG tablet, Take 80 mg by mouth daily., Disp: , Rfl:  .  carvedilol (COREG) 6.25 MG tablet, Take 1 tablet (6.25 mg total) by mouth 2 (two) times daily with a meal., Disp: 180 tablet, Rfl: 2 .  citalopram (CELEXA) 40 MG tablet, Take 40 mg by mouth daily., Disp: , Rfl:  .  clopidogrel (PLAVIX) 75 MG tablet, Take 1 tablet (75 mg total) by mouth daily with breakfast., Disp: 90 tablet, Rfl: 2 .  furosemide (LASIX) 20 MG tablet, Take 1 tablet (20 mg total) by mouth daily., Disp: 90 tablet, Rfl: 2 .  insulin aspart (NOVOLOG) 100 UNIT/ML injection, Inject 15 Units into the skin 3 (three) times daily with meals., Disp: , Rfl:  .  insulin NPH-regular Human (NOVOLIN 70/30) (70-30) 100 UNIT/ML injection, Inject 20-50 Units into the skin 2 (two) times daily with a meal. 30 units in the morning and 20 at night, Disp: 10 mL, Rfl: 11 .  isosorbide mononitrate (IMDUR) 30 MG 24 hr tablet, Take 0.5 tablets (15 mg total) by mouth daily., Disp: 45 tablet, Rfl: 2 .  losartan (COZAAR) 25 MG tablet, Take 1 tablet (25 mg total) by mouth daily., Disp: 30 tablet, Rfl: 0 .  metFORMIN (GLUCOPHAGE-XR) 500 MG 24 hr tablet, Take 500 mg by mouth 2 (two) times daily., Disp: , Rfl:  .  omeprazole (PRILOSEC) 20 MG capsule, Take 20 mg by mouth 2 (two) times  daily before a meal., Disp: , Rfl:  .  Polyethyl Glycol-Propyl Glycol (SYSTANE) 0.4-0.3 % SOLN, Apply 1 drop to eye daily., Disp: , Rfl:  .  sacubitril-valsartan (ENTRESTO) 24-26 MG, Take 1 tablet by mouth 2 (two) times daily., Disp: 180 tablet, Rfl: 3 .  traMADol (ULTRAM) 50 MG tablet, Take 50 mg by mouth every 8 (eight) hours as needed for pain., Disp: , Rfl:   Past Medical History: Past Medical History:  Diagnosis Date  . CAD (coronary artery disease)    a. 02/2017 NSTEMI/Cath: LM 40d, LAD 25m 765m, D2 small w/ sev prox/m dzs, LCX nl, OM1 sev diff dzs - small vessel, OM2 80-90p/m, RCA dominant 3050mPDA 50, EF <20%-->Med Rx.  . Diabetes mellitus without complication ([place] . HFrEF (heart failure with reduced ejection fraction) ([place]  a. 02/2017 Echo: EF 20-25%.  . HMarland Kitchenperlipidemia   . Hypertension   . Ischemic cardiomyopathy    a. 02/2017 Echo: EF 20-25%, diff HK, Gr2 DD, mild MR.  . LBBB (left bundle branch block)    a. Noted 02/2017. Pt denies any known prior history of LBBB (not present on 2014 [facility] ECG interpretation).  . Morbid [facility] ([facility] . [facility]  [facility]. [facility]    Tobacco Use: Social History   Tobacco Use  Smoking Status Former Smoker  . Packs/day: 1.00  . Years: 15.00  . Pack years: 15.00  Smokeless Tobacco Never Used  Tobacco Comment   Quit in his 90's.    Labs: Recent Review Flowsheet Data    Labs for ITP Cardiac and Pulmonary Rehab Latest Ref Rng & Units 03/02/2017 03/03/2017 03/04/2017   Cholestrol 0 - 200 mg/dL - - 94   LDLCALC 0 - 99 mg/dL - - 36   HDL >40 mg/dL - - 35(L)   Trlycerides <150 mg/dL - - 117   Hemoglobin A1c 4.8 - 5.6 % - 7.1(H) -   HCO3 20.0 - 28.0 mmol/L 22.1 - -   ACIDBASEDEF 0.0 - 2.0 mmol/L 1.7 - -   O2SAT % 91.1 - -       Exercise Target Goals:    Exercise Program Goal: Individual exercise prescription set using results from initial 6 min walk test and  THRR while considering  patient's activity barriers and safety.   Exercise Prescription Goal: Initial exercise prescription builds to 30-45 minutes a day of aerobic activity, 2-3 days per week.  Home exercise guidelines will be given to patient during program as part of exercise prescription that the participant will acknowledge.  Activity Barriers & Risk Stratification: Activity Barriers & Cardiac Risk Stratification - 03/22/17 1333      Activity Barriers & Cardiac Risk Stratification   Activity Barriers  Arthritis;Shortness of Breath;Deconditioning;Muscular Weakness    Cardiac Risk Stratification  High       6 Minute Walk: 6 Minute Walk    Row Name 03/22/17 1404         6 Minute Walk   Phase  Initial     Distance  1085 feet     Walk Time  6 minutes     # of Rest Breaks  0     MPH  2.06     METS  2.41     RPE  13     VO2 Peak  8.45     Symptoms  No     Resting HR  5 bpm     Resting BP  126/72     Resting Oxygen Saturation   97 %     Exercise Oxygen Saturation  during 6 min walk  98 %     Max Ex. HR  88 bpm     Max Ex. BP  152/64     2 Minute Post BP  134/64        Oxygen Initial Assessment:   Oxygen Re-Evaluation:   Oxygen Discharge (Final Oxygen Re-Evaluation):   Initial Exercise Prescription: Initial Exercise Prescription - 03/22/17 1400      Date of Initial Exercise RX and Referring Provider   Date  03/22/17    Referring Provider  Ida Rogue MD      Treadmill   MPH  1.8    Grade  0.5    Minutes  15    METs  2.5      Recumbant Bike   Level  1    RPM  50    Watts  14    Minutes  15    METs  2      NuStep   Level  1    SPM  80    Minutes  15    METs  2      Prescription Details   Frequency (  times per week)  3    Duration  Progress to 45 minutes of aerobic exercise without signs/symptoms of physical distress      Intensity   THRR 40-80% of Max Heartrate  100-135    Ratings of Perceived Exertion  11-13    Perceived Dyspnea  0-4       Progression   Progression  Continue to progress workloads to maintain intensity without signs/symptoms of physical distress.      Resistance Training   Training Prescription  Yes    Weight  3 lbs    Reps  10-15       Perform Capillary Blood Glucose checks as needed.  Exercise Prescription Changes: Exercise Prescription Changes    Row Name 03/22/17 1300             Response to Exercise   Blood Pressure (Admit)  126/72       Blood Pressure (Exercise)  152/64       Blood Pressure (Exit)  134/64       Heart Rate (Admit)  64 bpm       Heart Rate (Exercise)  88 bpm       Heart Rate (Exit)  68 bpm       Oxygen Saturation (Admit)  97 %       Oxygen Saturation (Exercise)  98 %       Rating of Perceived Exertion (Exercise)  13       Symptoms  none       Comments  walk test results          Exercise Comments: Exercise Comments    Row Name 03/26/17 0856           Exercise Comments   First full day of exercise!  Patient was oriented to gym and equipment including functions, settings, policies, and procedures.  Patient's individual exercise prescription and treatment plan were reviewed.  All starting workloads were established based on the results of the 6 minute walk test done at initial orientation visit.  The plan for exercise progression was also introduced and progression will be customized based on patient's performance and goals.          Exercise Goals and Review: Exercise Goals    Row Name 03/22/17 1408             Exercise Goals   Increase Physical Activity  Yes       Intervention  Provide advice, education, support and counseling about physical activity/exercise needs.;Develop an individualized exercise prescription for aerobic and resistive training based on initial evaluation findings, risk stratification, comorbidities and participant's personal goals.       Expected Outcomes  Short Term: Attend rehab on a regular basis to increase amount of physical  activity.;Long Term: Add in home exercise to make exercise part of routine and to increase amount of physical activity.;Long Term: Exercising regularly at least 3-5 days a week.       Increase Strength and Stamina  Yes       Intervention  Provide advice, education, support and counseling about physical activity/exercise needs.;Develop an individualized exercise prescription for aerobic and resistive training based on initial evaluation findings, risk stratification, comorbidities and participant's personal goals.       Expected Outcomes  Short Term: Increase workloads from initial exercise prescription for resistance, speed, and METs.;Short Term: Perform resistance training exercises routinely during rehab and add in resistance training at home;Long Term: Improve cardiorespiratory fitness, muscular endurance and strength as  measured by increased METs and functional capacity (6MWT)       Able to understand and use rate of perceived exertion (RPE) scale  Yes       Intervention  Provide education and explanation on how to use RPE scale       Expected Outcomes  Short Term: Able to use RPE daily in rehab to express subjective intensity level;Long Term:  Able to use RPE to guide intensity level when exercising independently       Knowledge and understanding of Target Heart Rate Range (THRR)  Yes       Intervention  Provide education and explanation of THRR including how the numbers were predicted and where they are located for reference       Expected Outcomes  Short Term: Able to state/look up THRR;Long Term: Able to use THRR to govern intensity when exercising independently;Short Term: Able to use daily as guideline for intensity in rehab       Able to check pulse independently  Yes       Intervention  Provide education and demonstration on how to check pulse in carotid and radial arteries.;Review the importance of being able to check your own pulse for safety during independent exercise       Expected  Outcomes  Short Term: Able to explain why pulse checking is important during independent exercise;Long Term: Able to check pulse independently and accurately       Understanding of Exercise Prescription  Yes       Intervention  Provide education, explanation, and written materials on patient's individual exercise prescription       Expected Outcomes  Short Term: Able to explain program exercise prescription;Long Term: Able to explain home exercise prescription to exercise independently          Exercise Goals Re-Evaluation : Exercise Goals Re-Evaluation    Row Name 03/26/17 0856             Exercise Goal Re-Evaluation   Exercise Goals Review  Increase Physical Activity;Increase Strength and Stamina;Able to understand and use rate of perceived exertion (RPE) scale;Knowledge and understanding of Target Heart Rate Range (THRR)       Comments  Reviewed RPE scale, THR and program prescription with pt today.  Pt voiced understanding and was given a copy of goals to take home.        Expected Outcomes  Short: Use RPE daily to regulate intensity.  Long: Follow program prescription in THR.          Discharge Exercise Prescription (Final Exercise Prescription Changes): Exercise Prescription Changes - 03/22/17 1300      Response to Exercise   Blood Pressure (Admit)  126/72    Blood Pressure (Exercise)  152/64    Blood Pressure (Exit)  134/64    Heart Rate (Admit)  64 bpm    Heart Rate (Exercise)  88 bpm    Heart Rate (Exit)  68 bpm    Oxygen Saturation (Admit)  97 %    Oxygen Saturation (Exercise)  98 %    Rating of Perceived Exertion (Exercise)  13    Symptoms  none    Comments  walk test results       Nutrition:  Target Goals: Understanding of nutrition guidelines, daily intake of sodium <1545m, cholesterol <2010m calories 30% from fat and 7% or less from saturated fats, daily to have 5 or more servings of fruits and vegetables.  Biometrics: Pre Biometrics - 03/22/17 1408  Pre Biometrics   Height  5' 9.75" (1.772 m)    Weight  227 lb 6.4 oz (103.1 kg)    Waist Circumference  43.5 inches    Hip Circumference  40.25 inches    Waist to Hip Ratio  1.08 %    BMI (Calculated)  32.85    Single Leg Stand  1.1 seconds        Nutrition Therapy Plan and Nutrition Goals: Nutrition Therapy & Goals - 03/22/17 1329      Intervention Plan   Intervention  Prescribe, educate and counsel regarding individualized specific dietary modifications aiming towards targeted core components such as weight, hypertension, lipid management, diabetes, heart failure and other comorbidities.;Nutrition handout(s) given to patient.    Expected Outcomes  Short Term Goal: Understand basic principles of dietary content, such as calories, fat, sodium, cholesterol and nutrients.;Short Term Goal: A plan has been developed with personal nutrition goals set during dietitian appointment.;Long Term Goal: Adherence to prescribed nutrition plan.       Nutrition Assessments: Nutrition Assessments - 03/22/17 1104      MEDFICTS Scores   Pre Score  6       Nutrition Goals Re-Evaluation:   Nutrition Goals Discharge (Final Nutrition Goals Re-Evaluation):   Psychosocial: Target Goals: Acknowledge presence or absence of significant depression and/or stress, maximize coping skills, provide positive support system. Participant is able to verbalize types and ability to use techniques and skills needed for reducing stress and depression.   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 This and his other health issues make it hard to participate in activities around the house that he used to do. 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 Setting desired goal for each stressor or current issue identified.;Long Term Goal: Stressors or current issues are controlled or eliminated.;Short Term goal: Identification and review with participant of any Quality of Life or Depression concerns found by scoring the questionnaire.;Long Term goal: The participant improves quality of Life and PHQ9 Scores as seen by post scores and/or verbalization of changes       Quality of Life Scores:  Quality of Life - 03/22/17 1103      Quality of Life Scores   Health/Function Pre  22.07 %    Socioeconomic Pre  25 %    Psych/Spiritual Pre  30 %    Family Pre  26 %    GLOBAL Pre  24.97 %      Scores of 19 and below usually indicate a poorer quality of life in these areas.  A difference of  2-3 points is a clinically meaningful difference.  A difference of 2-3 points in the total score of the Quality of  Life Index has been associated with significant improvement in overall quality of life, self-image, physical symptoms, and general health in studies assessing change in quality of life.  PHQ-9: Recent Review Flowsheet Data    Depression screen [facility] 2/9 03/22/2017   Decreased Interest 0   Down, Depressed, Hopeless 0   PHQ - 2 Score 0   Altered sleeping 0   Tired, decreased energy 1   Change in appetite 0   Feeling bad or failure about yourself  0   Trouble concentrating 1   Moving slowly or fidgety/restless 1    Suicidal thoughts 0   PHQ-9 Score 3   Difficult doing work/chores Somewhat difficult     Interpretation of Total Score  Total Score Depression Severity:  1-4 = Minimal depression, 5-9 = Mild depression, 10-14 = Moderate depression, 15-19 = Moderately severe depression, 20-27 = Severe depression   Psychosocial Evaluation and Intervention:   Psychosocial Re-Evaluation:   Psychosocial Discharge (Final Psychosocial Re-Evaluation):   Vocational Rehabilitation: Provide vocational rehab assistance to qualifying candidates.   Vocational Rehab Evaluation & Intervention: Vocational Rehab - 03/22/17 1332      Initial Vocational Rehab Evaluation & Intervention   Assessment shows need for Vocational Rehabilitation  No       Education: Education Goals: Education classes will be provided on a variety of topics geared toward better understanding of heart health and risk factor modification. Participant will state understanding/return demonstration of topics presented as noted by education test scores.  Learning Barriers/Preferences: Learning Barriers/Preferences - 03/22/17 1332      Learning Barriers/Preferences   Learning Barriers  Hearing;Sight;Reading    Learning Preferences  Individual Instruction;Verbal Instruction       Education Topics:  AED/CPR: - Group verbal and written instruction with the use of models to demonstrate the basic use of the AED with the basic ABC's of resuscitation.   General Nutrition Guidelines/Fats and Fiber: -Group instruction provided by verbal, written material, models and posters to present the general guidelines for heart healthy nutrition. Gives an explanation and review of dietary fats and fiber.   Cardiac Rehab from 03/29/2017 in [facility] Cardiac and Pulmonary Rehab  Date  03/29/17  Educator  CR  Instruction Review Code  1- Verbalizes Understanding      Controlling Sodium/Reading Food Labels: -Group verbal and written material supporting the  discussion of sodium use in heart healthy nutrition. Review and explanation with models, verbal and written materials for utilization of the food label.   Exercise Physiology & General Exercise Guidelines: - Group verbal and written instruction with models to review the exercise physiology of the cardiovascular system and associated critical values. Provides general exercise guidelines with specific guidelines to those with heart or lung disease.    Aerobic Exercise & Resistance Training: - Gives group verbal and written instruction on the various components of exercise. Focuses on aerobic and resistive training programs and the benefits of this training and how to safely progress through these programs..   Flexibility, Balance, Mind/Body Relaxation: Provides group verbal/written instruction on the benefits of flexibility and balance training, including mind/body exercise modes such as yoga, pilates and tai chi.  Demonstration and skill practice provided.   Stress and Anxiety: - Provides group verbal and written instruction about the health risks of elevated stress and causes of high stress.  Discuss the correlation between heart/lung disease and anxiety and treatment options. Review healthy ways to manage with stress and anxiety.   Depression: - Provides group verbal and written instruction  on the correlation between heart/lung disease and depressed mood, treatment options, and the stigmas associated with seeking treatment.   Anatomy & Physiology of the Heart: - Group verbal and written instruction and models provide basic cardiac anatomy and physiology, with the coronary electrical and arterial systems. Review of Valvular disease and Heart Failure   Cardiac Procedures: - Group verbal and written instruction to review commonly prescribed medications for heart disease. Reviews the medication, class of the drug, and side effects. Includes the steps to properly store meds and maintain the  prescription regimen. (beta blockers and nitrates)   Cardiac Medications I: - Group verbal and written instruction to review commonly prescribed medications for heart disease. Reviews the medication, class of the drug, and side effects. Includes the steps to properly store meds and maintain the prescription regimen.   Cardiac Medications II: -Group verbal and written instruction to review commonly prescribed medications for heart disease. Reviews the medication, class of the drug, and side effects. (all other drug classes)    Go Sex-Intimacy & Heart Disease, Get SMART - Goal Setting: - Group verbal and written instruction through game format to discuss heart disease and the return to sexual intimacy. Provides group verbal and written material to discuss and apply goal setting through the application of the S.M.A.R.T. Method.   Other Matters of the Heart: - Provides group verbal, written materials and models to describe Stable Angina and Peripheral Artery. Includes description of the disease process and treatment options available to the cardiac patient.   Exercise & Equipment Safety: - Individual verbal instruction and demonstration of equipment use and safety with use of the equipment.   Cardiac Rehab from 03/29/2017 in [facility] Cardiac and Pulmonary Rehab  Date  03/22/17  Educator  [facility]  Instruction Review Code  1- Verbalizes Understanding      Infection Prevention: - Provides verbal and written material to individual with discussion of infection control including proper hand washing and proper equipment cleaning during exercise session.   Cardiac Rehab from 03/29/2017 in [facility] Cardiac and Pulmonary Rehab  Date  03/22/17  Educator  [facility]  Instruction Review Code  1- Verbalizes Understanding      Falls Prevention: - Provides verbal and written material to individual with discussion of falls prevention and safety.   Cardiac Rehab from 03/29/2017 in [facility] Cardiac and Pulmonary Rehab  Date   03/22/17  Educator  [facility]  Instruction Review Code  1- Verbalizes Understanding      Diabetes: - Individual verbal and written instruction to review signs/symptoms of diabetes, desired ranges of glucose level fasting, after meals and with exercise. Acknowledge that pre and post exercise glucose checks will be done for 3 sessions at entry of program.   Cardiac Rehab from 03/29/2017 in [facility] Cardiac and Pulmonary Rehab  Date  03/22/17  Educator  [facility]  Instruction Review Code  1- Verbalizes Understanding      Know Your Numbers and Risk Factors: -Group verbal and written instruction about important numbers in your health.  Discussion of what are risk factors and how they play a role in the disease process.  Review of Cholesterol, Blood Pressure, Diabetes, and BMI and the role they play in your overall health.   Sleep Hygiene: -Provides group verbal and written instruction about how sleep can affect your health.  Define sleep hygiene, discuss sleep cycles and impact of sleep habits. Review good sleep hygiene tips.    Other: -Provides group and verbal instruction on various topics (see comments)  Knowledge Questionnaire Score: Knowledge Questionnaire Score - 03/22/17 1104      Knowledge Questionnaire Score   Pre Score  21/28 correct answers reviewed with Brennon       Core Components/Risk Factors/Patient Goals at Admission: Personal Goals and Risk Factors at Admission - 03/22/17 1325      Core Components/Risk Factors/Patient Goals on Admission    Weight Management  Yes;Weight Loss    Intervention  Weight Management: Develop a combined nutrition and exercise program designed to reach desired caloric intake, while maintaining appropriate intake of nutrient and fiber, sodium and fats, and appropriate energy expenditure required for the weight goal.;Weight Management: Provide education and appropriate resources to help participant work on and attain dietary goals.;Weight Management/Obesity:  Establish reasonable short term and long term weight goals.    Admit Weight  226 lb (102.5 kg)    Goal Weight: Short Term  222 lb (100.7 kg)    Goal Weight: Long Term  199 lb (90.3 kg)    Expected Outcomes  Short Term: Continue to assess and modify interventions until short term weight is achieved;Long Term: Adherence to nutrition and physical activity/exercise program aimed toward attainment of established weight goal;Weight Loss: Understanding of general recommendations for a balanced deficit meal plan, which promotes 1-2 lb weight loss per week and includes a negative energy balance of [phone] kcal/d;Understanding recommendations for meals to include 15-35% energy as protein, 25-35% energy from fat, 35-60% energy from carbohydrates, less than 27m of dietary cholesterol, 20-35 gm of total fiber daily;Understanding of distribution of calorie intake throughout the day with the consumption of 4-5 meals/snacks    Diabetes  Yes    Intervention  Provide education about signs/symptoms and action to take for hypo/hyperglycemia.;Provide education about proper nutrition, including hydration, and aerobic/resistive exercise prescription along with prescribed medications to achieve blood glucose in normal ranges: Fasting glucose 65-99 mg/dL    Expected Outcomes  Short Term: Participant verbalizes understanding of the signs/symptoms and immediate care of hyper/hypoglycemia, proper foot care and importance of medication, aerobic/resistive exercise and nutrition plan for blood glucose control.;Long Term: Attainment of HbA1C < 7%.    Heart Failure  Yes    Intervention  Provide a combined exercise and nutrition program that is supplemented with education, support and counseling about heart failure. Directed toward relieving symptoms such as shortness of breath, decreased exercise tolerance, and extremity edema.    Expected Outcomes  Improve functional capacity of life    Hypertension  Yes    Intervention  Provide  education on lifestyle modifcations including regular physical activity/exercise, weight management, moderate sodium restriction and increased consumption of fresh fruit, vegetables, and low fat dairy, alcohol moderation, and smoking cessation.;Monitor prescription use compliance.    Expected Outcomes  Short Term: Continued assessment and intervention until BP is < 140/923mHG in hypertensive participants. < 130/8069mG in hypertensive participants with diabetes, heart failure or chronic kidney disease.;Long Term: Maintenance of blood pressure at goal levels.    Lipids  Yes    Intervention  Provide education and support for participant on nutrition & aerobic/resistive exercise along with prescribed medications to achieve LDL <28m12mDL >40mg88m Expected Outcomes  Short Term: Participant states understanding of desired cholesterol values and is compliant with medications prescribed. Participant is following exercise prescription and nutrition guidelines.;Long Term: Cholesterol controlled with medications as prescribed, with individualized exercise RX and with personalized nutrition plan. Value goals: LDL < 28mg,53m > 40 mg.    Stress  Yes DelmasSian  not been able to participate in yard work or perform househole activities like he used to due to his 3 strokes and other health issues    Intervention  Offer individual and/or small group education and counseling on adjustment to heart disease, stress management and health-related lifestyle change. Teach and support self-help strategies.;Refer participants experiencing significant psychosocial distress to appropriate mental health specialists for further evaluation and treatment. When possible, include family members and significant others in education/counseling sessions.    Expected Outcomes  Short Term: Participant demonstrates changes in health-related behavior, relaxation and other stress management skills, ability to obtain effective social support, and  compliance with psychotropic medications if prescribed.;Long Term: Emotional wellbeing is indicated by absence of clinically significant psychosocial distress or social isolation.       Core Components/Risk Factors/Patient Goals Review:    Core Components/Risk Factors/Patient Goals at Discharge (Final Review):    ITP Comments: ITP Comments    Row Name 03/22/17 1308 03/31/17 0605         ITP Comments  Med Review completed. Initial ITP created. Diagnosis can be found in [facility] encounter 03/02/17  30 Day review. Continue with ITP unless directed changes per Medical Director review.  New to program         Comments:

## 2017-04-01 ENCOUNTER — Telehealth: Payer: Self-pay | Admitting: Nurse Practitioner

## 2017-04-01 NOTE — Telephone Encounter (Signed)
PA submitted on 3/26 through Cover My Meds for the patient's entresto 24/26 mg tablets. Fax received from [facility] stating this has been approved - good through 04/01/2019.  I left a message on the [facility] line in [place] at [phone] that this has been done.

## 2017-04-02 DIAGNOSIS — I214 Non-ST elevation (NSTEMI) myocardial infarction: Secondary | ICD-10-CM | POA: Diagnosis not present

## 2017-04-02 NOTE — Progress Notes (Signed)
Daily Session Note  Patient Details  Name: Adam Douglas MRN: [id number] Date of Birth: [birth year] Referring Provider:     Cardiac Rehab from 03/22/2017 in [facility] Cardiac and Pulmonary Rehab  Referring Provider  Ida Rogue MD      Encounter Date: 04/02/2017  Check In: Session Check In - 04/02/17 0809      Check-In   Location  [facility]-Cardiac & Pulmonary Rehab    Staff Present  Alberteen Sam, MA, RCEP, CCRP, Exercise Physiologist;Meredith Sherryll Burger, RN Vickki Hearing, BA, ACSM CEP, Exercise Physiologist    Supervising physician immediately available to respond to emergencies  See telemetry face sheet for immediately available ER MD    Medication changes reported      No    Fall or balance concerns reported     No    Warm-up and Cool-down  Performed on first and last piece of equipment    Resistance Training Performed  Yes    VAD Patient?  No      Pain Assessment   Currently in Pain?  No/denies    Multiple Pain Sites  No          Social History   Tobacco Use  Smoking Status Former Smoker  . Packs/day: 1.00  . Years: 15.00  . Pack years: 15.00  Smokeless Tobacco Never Used  Tobacco Comment   Quit in his 32's.    Goals Met:  Independence with exercise equipment Exercise tolerated well No report of cardiac concerns or symptoms Strength training completed today  Goals Unmet:  Not Applicable  Comments: Pt able to follow exercise prescription today without complaint.  Will continue to monitor for progression.    Dr. Emily Filbert is Medical Director for [place] and [facility].

## 2017-04-05 ENCOUNTER — Encounter: Payer: Medicare HMO | Attending: Cardiovascular Disease | Admitting: *Deleted

## 2017-04-05 DIAGNOSIS — Z79899 Other long term (current) drug therapy: Secondary | ICD-10-CM | POA: Insufficient documentation

## 2017-04-05 DIAGNOSIS — E785 Hyperlipidemia, unspecified: Secondary | ICD-10-CM | POA: Insufficient documentation

## 2017-04-05 DIAGNOSIS — I255 Ischemic cardiomyopathy: Secondary | ICD-10-CM | POA: Insufficient documentation

## 2017-04-05 DIAGNOSIS — E119 Type 2 diabetes mellitus without complications: Secondary | ICD-10-CM | POA: Diagnosis not present

## 2017-04-05 DIAGNOSIS — I10 Essential (primary) hypertension: Secondary | ICD-10-CM | POA: Insufficient documentation

## 2017-04-05 DIAGNOSIS — Z87891 Personal history of nicotine dependence: Secondary | ICD-10-CM | POA: Insufficient documentation

## 2017-04-05 DIAGNOSIS — I214 Non-ST elevation (NSTEMI) myocardial infarction: Secondary | ICD-10-CM

## 2017-04-05 DIAGNOSIS — Z794 Long term (current) use of insulin: Secondary | ICD-10-CM | POA: Insufficient documentation

## 2017-04-05 DIAGNOSIS — Z7982 Long term (current) use of aspirin: Secondary | ICD-10-CM | POA: Diagnosis not present

## 2017-04-05 DIAGNOSIS — I2510 Atherosclerotic heart disease of native coronary artery without angina pectoris: Secondary | ICD-10-CM | POA: Insufficient documentation

## 2017-04-05 DIAGNOSIS — Z8673 Personal history of transient ischemic attack (TIA), and cerebral infarction without residual deficits: Secondary | ICD-10-CM | POA: Insufficient documentation

## 2017-04-05 DIAGNOSIS — I447 Left bundle-branch block, unspecified: Secondary | ICD-10-CM | POA: Insufficient documentation

## 2017-04-05 NOTE — Progress Notes (Signed)
Daily Session Note  Patient Details  Name: Adam Douglas MRN: [id number] Date of Birth: [birth year] Referring Provider:     Cardiac Rehab from 03/22/2017 in [facility] Cardiac and Pulmonary Rehab  Referring Provider  Ida Rogue MD      Encounter Date: 04/05/2017  Check In: Session Check In - 04/05/17 0852      Check-In   Location  [facility]-Cardiac & Pulmonary Rehab    Staff Present  Heath Lark, RN, BSN, CCRP;Earlena Werst Luan Pulling, MA, RCEP, CCRP, Exercise Physiologist;Kelly Amedeo Plenty, BS, ACSM CEP, Exercise Physiologist    Supervising physician immediately available to respond to emergencies  See telemetry face sheet for immediately available ER MD    Medication changes reported      No    Fall or balance concerns reported     No    Warm-up and Cool-down  Performed on first and last piece of equipment    Resistance Training Performed  Yes    VAD Patient?  No      Pain Assessment   Currently in Pain?  No/denies          Social History   Tobacco Use  Smoking Status Former Smoker  . Packs/day: 1.00  . Years: 15.00  . Pack years: 15.00  Smokeless Tobacco Never Used  Tobacco Comment   Quit in his 66's.    Goals Met:  Independence with exercise equipment Exercise tolerated well No report of cardiac concerns or symptoms Strength training completed today  Goals Unmet:  Not Applicable  Comments: Pt able to follow exercise prescription today without complaint.  Will continue to monitor for progression.    Dr. Emily Filbert is Medical Director for [place] and [facility].

## 2017-04-07 DIAGNOSIS — I214 Non-ST elevation (NSTEMI) myocardial infarction: Secondary | ICD-10-CM

## 2017-04-07 NOTE — Progress Notes (Signed)
Daily Session Note  Patient Details  Name: Adam Douglas MRN: [id number] Date of Birth: 1950-05-04 Referring Provider:     Cardiac Rehab from 03/22/2017 in [facility] Cardiac and Pulmonary Rehab  Referring Provider  Ida Rogue MD      Encounter Date: 04/07/2017  Check In: Session Check In - 04/07/17 0758      Check-In   Location  [facility]-Cardiac & Pulmonary Rehab    Staff Present  Justin Mend RCP,RRT,BSRT;Heath Lark, RN, BSN, CCRP;Jessica Luan Pulling, MA, RCEP, CCRP, Exercise Physiologist    Supervising physician immediately available to respond to emergencies  See telemetry face sheet for immediately available ER MD    Medication changes reported      No    Fall or balance concerns reported     No    Tobacco Cessation  No Change    Warm-up and Cool-down  Performed on first and last piece of equipment    Resistance Training Performed  Yes    VAD Patient?  No      Pain Assessment   Currently in Pain?  No/denies          Social History   Tobacco Use  Smoking Status Former Smoker  . Packs/day: 1.00  . Years: 15.00  . Pack years: 15.00  Smokeless Tobacco Never Used  Tobacco Comment   Quit in his 85's.    Goals Met:  Independence with exercise equipment Exercise tolerated well No report of cardiac concerns or symptoms Strength training completed today  Goals Unmet:  Not Applicable  Comments: Pt able to follow exercise prescription today without complaint.  Will continue to monitor for progression.   Dr. Emily Filbert is Medical Director for [place] and [facility].

## 2017-04-09 ENCOUNTER — Encounter: Payer: Medicare HMO | Admitting: *Deleted

## 2017-04-09 DIAGNOSIS — I214 Non-ST elevation (NSTEMI) myocardial infarction: Secondary | ICD-10-CM

## 2017-04-09 NOTE — Progress Notes (Signed)
Daily Session Note  Patient Details  Name: Adam Douglas MRN: [id number] Date of Birth: [birth year] Referring Provider:     Cardiac Rehab from 03/22/2017 in [facility] Cardiac and Pulmonary Rehab  Referring Provider  Ida Rogue MD      Encounter Date: 04/09/2017  Check In: Session Check In - 04/09/17 0821      Check-In   Location  [facility]-Cardiac & Pulmonary Rehab    Staff Present  Nyoka Cowden, RN, BSN, Glori Bickers, BS, Olga Coaster, RN BSN    Supervising physician immediately available to respond to emergencies  See telemetry face sheet for immediately available ER MD    Medication changes reported      No    Fall or balance concerns reported     No    Tobacco Cessation  No Change    Warm-up and Cool-down  Performed on first and last piece of equipment    Resistance Training Performed  Yes    VAD Patient?  No      Pain Assessment   Currently in Pain?  No/denies    Multiple Pain Sites  No          Social History   Tobacco Use  Smoking Status Former Smoker  . Packs/day: 1.00  . Years: 15.00  . Pack years: 15.00  Smokeless Tobacco Never Used  Tobacco Comment   Quit in his 53's.    Goals Met:  Independence with exercise equipment Exercise tolerated well No report of cardiac concerns or symptoms Strength training completed today  Goals Unmet:  Not Applicable  Comments: Pt able to follow exercise prescription today without complaint.  Will continue to monitor for progression.    Dr. Emily Filbert is Medical Director for [place] and [facility].

## 2017-04-10 NOTE — Progress Notes (Deleted)
Cardiology Office Note  Date:  04/10/2017   ID:  Adam Douglas, Adam Douglas Aug 09, 1950, MRN [id number]  PCP:  Delton Prairie, MD   No chief complaint on file.   HPI:  67 year old male with a history of  diabetes,  Hypertension, hyperlipidemia,  ocular strokes,  Admission 03/03/2017  for non-STEMI with finding of left bundle branch block,  severe multivessel CAD and an ischemic cardiomyopathy with an EF of 20-25% Who presents for follow up of his cardiomyopathy and CAD   diagnostic catheterization 03/03/2017 revealing moderate to severe diffuse, small vessel coronary artery disease with an EF of less than 20% by ventriculography.    Echocardiogram 02/2017 showed an EF of 20-25%.  PMH:   has a past medical history of CAD (coronary artery disease), Diabetes mellitus without complication (HCC), HFrEF (heart failure with reduced ejection fraction) (HCC), Hyperlipidemia, Hypertension, Ischemic cardiomyopathy, LBBB (left bundle branch block), Morbid obesity (HCC), and Stroke (HCC).  PSH:    Past Surgical History:  Procedure Laterality Date  . CERVICAL DISC SURGERY    . CHOLECYSTECTOMY    . LEFT HEART CATH AND CORONARY ANGIOGRAPHY N/A 03/03/2017   Procedure: LEFT HEART CATH AND CORONARY ANGIOGRAPHY;  Surgeon: Antonieta Iba, MD;  Location: [facility] INVASIVE CV LAB;  Service: Cardiovascular;  Laterality: N/A;    Current Outpatient Medications  Medication Sig Dispense Refill  . aspirin EC 81 MG tablet Take 1 tablet (81 mg total) by mouth daily. 90 tablet 3  . atorvastatin (LIPITOR) 40 MG tablet Take 80 mg by mouth daily.    . carvedilol (COREG) 6.25 MG tablet Take 1 tablet (6.25 mg total) by mouth 2 (two) times daily with a meal. 180 tablet 2  . citalopram (CELEXA) 40 MG tablet Take 40 mg by mouth daily.    . clopidogrel (PLAVIX) 75 MG tablet Take 1 tablet (75 mg total) by mouth daily with breakfast. 90 tablet 2  . furosemide (LASIX) 20 MG tablet Take 1 tablet (20 mg total) by mouth daily. 90 tablet 2   . insulin aspart (NOVOLOG) 100 UNIT/ML injection Inject 15 Units into the skin 3 (three) times daily with meals.    . insulin NPH-regular Human (NOVOLIN 70/30) (70-30) 100 UNIT/ML injection Inject 20-50 Units into the skin 2 (two) times daily with a meal. 30 units in the morning and 20 at night 10 mL 11  . isosorbide mononitrate (IMDUR) 30 MG 24 hr tablet Take 0.5 tablets (15 mg total) by mouth daily. 45 tablet 2  . losartan (COZAAR) 25 MG tablet Take 1 tablet (25 mg total) by mouth daily. 30 tablet 0  . metFORMIN (GLUCOPHAGE-XR) 500 MG 24 hr tablet Take 500 mg by mouth 2 (two) times daily.    Marland Kitchen omeprazole (PRILOSEC) 20 MG capsule Take 20 mg by mouth 2 (two) times daily before a meal.    . Polyethyl Glycol-Propyl Glycol (SYSTANE) 0.4-0.3 % SOLN Apply 1 drop to eye daily.    . sacubitril-valsartan (ENTRESTO) 24-26 MG Take 1 tablet by mouth 2 (two) times daily. 180 tablet 3  . traMADol (ULTRAM) 50 MG tablet Take 50 mg by mouth every 8 (eight) hours as needed for pain.     No current facility-administered medications for this visit.      Allergies:   Patient has no known allergies.   Social History:  The patient  reports that he has quit smoking. He has a 15.00 pack-year smoking history. He has never used smokeless tobacco. He reports that he does not drink  alcohol or use drugs.   Family History:   family history includes CAD in his sister; CAD (age of onset: 32) in his father; CAD (age of onset: 30) in his mother; Heart attack in his father and mother.    Review of Systems: ROS   PHYSICAL EXAM: VS:  There were no vitals taken for this visit. , BMI There is no height or weight on file to calculate BMI. GEN: Well nourished, well developed, in no acute distress HEENT: normal Neck: no JVD, carotid bruits, or masses Cardiac: RRR; no murmurs, rubs, or gallops,no edema  Respiratory:  clear to auscultation bilaterally, normal work of breathing GI: soft, nontender, nondistended, + BS MS: no  deformity or atrophy Skin: warm and dry, no rash Neuro:  Strength and sensation are intact Psych: euthymic mood, full affect    Recent Labs: 03/02/2017: ALT 31; B Natriuretic Peptide 488.0 03/03/2017: TSH 0.895 03/05/2017: Hemoglobin 11.5; Platelets 169 03/09/2017: BUN 26; Creatinine, Ser 1.26; Potassium 4.9; Sodium 139    Lipid Panel Lab Results  Component Value Date   CHOL 94 03/04/2017   HDL 35 (L) 03/04/2017   LDLCALC 36 03/04/2017   TRIG 117 03/04/2017      Wt Readings from Last 3 Encounters:  03/22/17 227 lb 6.4 oz (103.1 kg)  03/09/17 231 lb 4 oz (104.9 kg)  03/06/17 227 lb 11.2 oz (103.3 kg)       ASSESSMENT AND PLAN:  No diagnosis found.   Disposition:   F/U  6 months  No orders of the defined types were placed in this encounter.    Signed, [name], M.D., Ph.D. 04/10/2017  [facility] [place], [place] [phone]

## 2017-04-12 ENCOUNTER — Encounter: Payer: Medicare HMO | Admitting: *Deleted

## 2017-04-12 ENCOUNTER — Ambulatory Visit: Payer: Medicare HMO | Admitting: Cardiovascular Disease

## 2017-04-12 DIAGNOSIS — I214 Non-ST elevation (NSTEMI) myocardial infarction: Secondary | ICD-10-CM | POA: Diagnosis not present

## 2017-04-12 NOTE — Progress Notes (Signed)
Daily Session Note  Patient Details  Name: Adam Douglas MRN: [id number] Date of Birth: 18-Jan-1950 Referring Provider:     Cardiac Rehab from 03/22/2017 in [facility] Cardiac and Pulmonary Rehab  Referring Provider  Adam Rogue MD      Encounter Date: 04/12/2017  Check In: Session Check In - 04/12/17 0747      Check-In   Location  [facility]-Cardiac & Pulmonary Rehab    Staff Present  Adam Lark, RN, BSN, CCRP;Adam Douglas Luan Pulling, MA, RCEP, CCRP, Exercise Physiologist;Adam Douglas, BS, ACSM CEP, Exercise Physiologist    Supervising physician immediately available to respond to emergencies  See telemetry face sheet for immediately available ER MD    Medication changes reported      No    Fall or balance concerns reported     No    Warm-up and Cool-down  Performed on first and last piece of equipment    Resistance Training Performed  Yes    VAD Patient?  No      Pain Assessment   Currently in Pain?  No/denies          Social History   Tobacco Use  Smoking Status Former Smoker  . Packs/day: 1.00  . Years: 15.00  . Pack years: 15.00  Smokeless Tobacco Never Used  Tobacco Comment   Quit in his 41's.    Goals Met:  Independence with exercise equipment Exercise tolerated well No report of cardiac concerns or symptoms Strength training completed today  Goals Unmet:  Not Applicable  Comments: Pt able to follow exercise prescription today without complaint.  Will continue to monitor for progression.    Dr. Emily Douglas is Medical Director for [name].

## 2017-04-14 ENCOUNTER — Ambulatory Visit: Payer: Medicare HMO | Admitting: Cardiovascular Disease

## 2017-04-14 DIAGNOSIS — I214 Non-ST elevation (NSTEMI) myocardial infarction: Secondary | ICD-10-CM

## 2017-04-14 NOTE — Progress Notes (Signed)
Daily Session Note  Patient Details  Name: Adam Douglas MRN: [id number] Date of Birth: May 09, 1950 Referring Provider:     Cardiac Rehab from 03/22/2017 in [facility] Cardiac and Pulmonary Rehab  Referring Provider  Ida Rogue MD      Encounter Date: 04/14/2017  Check In: Session Check In - 04/14/17 0741      Check-In   Location  [facility]-Cardiac & Pulmonary Rehab    Staff Present  Justin Mend RCP,RRT,BSRT;Heath Lark, RN, BSN, CCRP;Jessica Luan Pulling, MA, RCEP, CCRP, Exercise Physiologist    Supervising physician immediately available to respond to emergencies  See telemetry face sheet for immediately available ER MD    Medication changes reported      No    Fall or balance concerns reported     No    Tobacco Cessation  No Change    Warm-up and Cool-down  Performed on first and last piece of equipment    Resistance Training Performed  Yes    VAD Patient?  No      Pain Assessment   Currently in Pain?  No/denies          Social History   Tobacco Use  Smoking Status Former Smoker  . Packs/day: 1.00  . Years: 15.00  . Pack years: 15.00  Smokeless Tobacco Never Used  Tobacco Comment   Quit in his 38's.    Goals Met:  Independence with exercise equipment Exercise tolerated well No report of cardiac concerns or symptoms Strength training completed today  Goals Unmet:  Not Applicable  Comments: Pt able to follow exercise prescription today without complaint.  Will continue to monitor for progression.   Dr. Emily Filbert is Medical Director for [place] and [facility].

## 2017-04-16 DIAGNOSIS — I214 Non-ST elevation (NSTEMI) myocardial infarction: Secondary | ICD-10-CM

## 2017-04-16 NOTE — Progress Notes (Signed)
Daily Session Note  Patient Details  Name: Camauri Craton MRN: [id number] Date of Birth: [birth year] Referring Provider:     Cardiac Rehab from 03/22/2017 in [facility] Cardiac and Pulmonary Rehab  Referring Provider  Ida Rogue MD      Encounter Date: 04/16/2017  Check In: Session Check In - 04/16/17 0911      Check-In   Location  [facility]-Cardiac & Pulmonary Rehab    Staff Present  Renita Papa, RN BSN;Jessica Luan Pulling, MA, RCEP, CCRP, Exercise Physiologist;Kitai Purdom Oletta Darter, [place], ACSM CEP, Exercise Physiologist    Supervising physician immediately available to respond to emergencies  See telemetry face sheet for immediately available ER MD    Medication changes reported      No    Fall or balance concerns reported     No    Warm-up and Cool-down  Performed on first and last piece of equipment    Resistance Training Performed  Yes    VAD Patient?  No      Pain Assessment   Currently in Pain?  No/denies    Multiple Pain Sites  No        Exercise Prescription Changes - 04/16/17 0800      Home Exercise Plan   Plans to continue exercise at  Home (comment) walking    Frequency  Add 2 additional days to program exercise sessions.    Initial Home Exercises Provided  04/16/17       Social History   Tobacco Use  Smoking Status Former Smoker  . Packs/day: 1.00  . Years: 15.00  . Pack years: 15.00  Smokeless Tobacco Never Used  Tobacco Comment   Quit in his 21's.    Goals Met:  Independence with exercise equipment Exercise tolerated well No report of cardiac concerns or symptoms Strength training completed today  Goals Unmet:  Not Applicable  Comments: Pt able to follow exercise prescription today without complaint.  Will continue to monitor for progression. Reviewed home exercise with pt today.  Pt plans to walk at home with his wife for exercise.  Reviewed THR, pulse, RPE, sign and symptoms, NTG use, and when to call 911 or MD.  Also discussed weather considerations and  indoor options.  Pt voiced understanding.    Dr. Emily Filbert is Medical Director for [place] and [facility].

## 2017-04-17 NOTE — Progress Notes (Deleted)
Cardiology Office Note  Date:  04/17/2017   ID:  Adam Douglas, Adam Douglas [birth year], MRN [id number]  PCP:  Delton Prairie, MD   No chief complaint on file.   HPI:  67 year old male with a history of  diabetes,  Hypertension, hyperlipidemia,  ocular strokes,  Admission 03/03/2017  for non-STEMI with finding of left bundle branch block,  severe multivessel CAD and an ischemic cardiomyopathy with an EF of 20-25% Who presents for follow up of his cardiomyopathy and CAD   diagnostic catheterization 03/03/2017 revealing moderate to severe diffuse, small vessel coronary artery disease with an EF of less than 20% by ventriculography.    Echocardiogram 02/2017 showed an EF of 20-25%.  PMH:   has a past medical history of CAD (coronary artery disease), Diabetes mellitus without complication (HCC), HFrEF (heart failure with reduced ejection fraction) (HCC), Hyperlipidemia, Hypertension, Ischemic cardiomyopathy, LBBB (left bundle branch block), Morbid obesity (HCC), and Stroke (HCC).  PSH:    Past Surgical History:  Procedure Laterality Date  . CERVICAL DISC SURGERY    . CHOLECYSTECTOMY    . LEFT HEART CATH AND CORONARY ANGIOGRAPHY N/A 03/03/2017   Procedure: LEFT HEART CATH AND CORONARY ANGIOGRAPHY;  Surgeon: Antonieta Iba, MD;  Location: [facility] INVASIVE CV LAB;  Service: Cardiovascular;  Laterality: N/A;    Current Outpatient Medications  Medication Sig Dispense Refill  . aspirin EC 81 MG tablet Take 1 tablet (81 mg total) by mouth daily. 90 tablet 3  . atorvastatin (LIPITOR) 40 MG tablet Take 80 mg by mouth daily.    . carvedilol (COREG) 6.25 MG tablet Take 1 tablet (6.25 mg total) by mouth 2 (two) times daily with a meal. 180 tablet 2  . citalopram (CELEXA) 40 MG tablet Take 40 mg by mouth daily.    . clopidogrel (PLAVIX) 75 MG tablet Take 1 tablet (75 mg total) by mouth daily with breakfast. 90 tablet 2  . furosemide (LASIX) 20 MG tablet Take 1 tablet (20 mg total) by mouth daily. 90 tablet 2   . insulin aspart (NOVOLOG) 100 UNIT/ML injection Inject 15 Units into the skin 3 (three) times daily with meals.    . insulin NPH-regular Human (NOVOLIN 70/30) (70-30) 100 UNIT/ML injection Inject 20-50 Units into the skin 2 (two) times daily with a meal. 30 units in the morning and 20 at night 10 mL 11  . isosorbide mononitrate (IMDUR) 30 MG 24 hr tablet Take 0.5 tablets (15 mg total) by mouth daily. 45 tablet 2  . losartan (COZAAR) 25 MG tablet Take 1 tablet (25 mg total) by mouth daily. 30 tablet 0  . metFORMIN (GLUCOPHAGE-XR) 500 MG 24 hr tablet Take 500 mg by mouth 2 (two) times daily.    Marland Kitchen omeprazole (PRILOSEC) 20 MG capsule Take 20 mg by mouth 2 (two) times daily before a meal.    . Polyethyl Glycol-Propyl Glycol (SYSTANE) 0.4-0.3 % SOLN Apply 1 drop to eye daily.    . sacubitril-valsartan (ENTRESTO) 24-26 MG Take 1 tablet by mouth 2 (two) times daily. 180 tablet 3  . traMADol (ULTRAM) 50 MG tablet Take 50 mg by mouth every 8 (eight) hours as needed for pain.     No current facility-administered medications for this visit.      Allergies:   Patient has no known allergies.   Social History:  The patient  reports that he has quit smoking. He has a 15.00 pack-year smoking history. He has never used smokeless tobacco. He reports that he does not drink  alcohol or use drugs.   Family History:   family history includes CAD in his sister; CAD (age of onset: 21) in his father; CAD (age of onset: 56) in his mother; Heart attack in his father and mother.    Review of Systems: ROS   PHYSICAL EXAM: VS:  There were no vitals taken for this visit. , BMI There is no height or weight on file to calculate BMI. GEN: Well nourished, well developed, in no acute distress HEENT: normal Neck: no JVD, carotid bruits, or masses Cardiac: RRR; no murmurs, rubs, or gallops,no edema  Respiratory:  clear to auscultation bilaterally, normal work of breathing GI: soft, nontender, nondistended, + BS MS: no  deformity or atrophy Skin: warm and dry, no rash Neuro:  Strength and sensation are intact Psych: euthymic mood, full affect    Recent Labs: 03/02/2017: ALT 31; B Natriuretic Peptide 488.0 03/03/2017: TSH 0.895 03/05/2017: Hemoglobin 11.5; Platelets 169 03/09/2017: BUN 26; Creatinine, Ser 1.26; Potassium 4.9; Sodium 139    Lipid Panel Lab Results  Component Value Date   CHOL 94 03/04/2017   HDL 35 (L) 03/04/2017   LDLCALC 36 03/04/2017   TRIG 117 03/04/2017      Wt Readings from Last 3 Encounters:  03/22/17 227 lb 6.4 oz (103.1 kg)  03/09/17 231 lb 4 oz (104.9 kg)  03/06/17 227 lb 11.2 oz (103.3 kg)       ASSESSMENT AND PLAN:  No diagnosis found.   Disposition:   F/U  6 months  No orders of the defined types were placed in this encounter.    Signed, [name], M.D., Ph.D. 04/17/2017  [facility] [place], [place] [phone]

## 2017-04-19 ENCOUNTER — Encounter: Payer: Medicare HMO | Admitting: *Deleted

## 2017-04-19 ENCOUNTER — Ambulatory Visit: Payer: Medicare HMO | Admitting: Cardiovascular Disease

## 2017-04-19 DIAGNOSIS — I214 Non-ST elevation (NSTEMI) myocardial infarction: Secondary | ICD-10-CM | POA: Diagnosis not present

## 2017-04-19 NOTE — Progress Notes (Signed)
Daily Session Note  Patient Details  Name: Adam Douglas MRN: [id number] Date of Birth: 14-May-1950 Referring Provider:     Cardiac Rehab from 03/22/2017 in [facility] Cardiac [facility] Pulmonary Rehab  Referring Provider  Ida Rogue MD      Encounter Date: 04/19/2017  Check In: Session Check In - 04/19/17 0752      Check-In   Location  [facility]-Cardiac & Pulmonary Rehab    Staff Present  Alberteen Sam, MA, RCEP, CCRP, Exercise Physiologist;Tory Septer Amedeo Plenty, BS, ACSM CEP, Exercise Physiologist;Susanne Bice, RN, BSN, CCRP    Supervising physician immediately available to respond to emergencies  See telemetry face sheet for immediately available ER MD    Medication changes reported      No    Fall or balance concerns reported     No    Warm-up and Cool-down  Performed on first and last piece of equipment    Resistance Training Performed  Yes    VAD Patient?  No      Pain Assessment   Currently in Pain?  No/denies    Multiple Pain Sites  No          Social History   Tobacco Use  Smoking Status Former Smoker  . Packs/day: 1.00  . Years: 15.00  . Pack years: 15.00  Smokeless Tobacco Never Used  Tobacco Comment   Quit in his 63's.    Goals Met:  Independence with exercise equipment Exercise tolerated well No report of cardiac concerns or symptoms Strength training completed today  Goals Unmet:  Not Applicable  Comments: Pt able to follow exercise prescription today without complaint.  Will continue to monitor for progression.    Dr. Emily Filbert is Medical Director for [facility] and [facility].

## 2017-04-21 DIAGNOSIS — I214 Non-ST elevation (NSTEMI) myocardial infarction: Secondary | ICD-10-CM

## 2017-04-21 NOTE — Progress Notes (Signed)
Daily Session Note  Patient Details  Name: Adam Douglas MRN: [id number] Date of Birth: 18-Aug-1950 Referring Provider:     Cardiac Rehab from 03/22/2017 in [facility] Cardiac and Pul[facility] Rehab  Referring Provider  Ida Rogue MD      Encounter Date: 04/21/2017  Check In: Session Check In - 04/21/17 0835      Check-In   Location  [facility]Cardiac [facility] Pul[facility]    Staff Present  Justin Mend RCP,RRT,BSRT;Heath Lark, RN, BSN, CCRP;Jessica Luan Pulling, MA, RCEP, CCRP, Exercise Physiologist    Supervising physician immediately available to respond to emergencies  See telemetry face sheet for immediately available ER MD    Medication changes reported      No    Fall or balance concerns reported     No    Tobacco Cessation  No Change    Warm-up and Cool-down  Performed on first and last piece of equipment    Resistance Training Performed  Yes    VAD Patient?  No      Pain Assessment   Currently in Pain?  No/denies          Social History   Tobacco Use  Smoking Status Former Smoker  . Packs/day: 1.00  . Years: 15.00  . Pack years: 15.00  Smokeless Tobacco Never Used  Tobacco Comment   Quit in his 30's.    Goals Met:  Independence with exercise equipment Exercise tolerated well No report of cardiac concerns or symptoms Strength training completed today  Goals Unmet:  Not Applicable  Comments: Pt able to follow exercise prescription today without complaint.  Will continue to monitor for progression.   Dr. Emily Filbert is Medical Director for [facility].

## 2017-04-23 ENCOUNTER — Encounter: Payer: Medicare HMO | Admitting: *Deleted

## 2017-04-23 DIAGNOSIS — I214 Non-ST elevation (NSTEMI) myocardial infarction: Secondary | ICD-10-CM | POA: Diagnosis not present

## 2017-04-23 NOTE — Progress Notes (Signed)
Daily Session Note  Patient Details  Name: Adam Douglas MRN: [id number] Date of Birth: 04-13-1950 Referring Provider:     Cardiac Rehab from 03/22/2017 in [facility] Cardiac and Pulmonary Rehab  Referring Provider  Ida Rogue MD      Encounter Date: 04/23/2017  Check In: Session Check In - 04/23/17 0855      Check-In   Location  [facility]-Cardiac [facility] Pulmonary Re[facility]    Staff Present  Renita Papa, RN BSN;Kaoir Loree Luan Pulling, MA, RCEP, CCRP, Exercise Physiologist;Mandi Zachery Conch, BS, [facility]    Supervising physician immediately available to respond to emergencies  See telemetry face sheet for immediately available ER MD    Medication changes reported      No    Fall or balance concerns reported     No    Warm-up and Cool-down  Performed on first and last piece of equipment    Resistance Training Performed  Yes    VAD Patient?  No      Pain Assessment   Currently in Pain?  No/denies          Social History   Tobacco Use  Smoking Status Former Smoker  . Packs/day: 1.00  . Years: 15.00  . Pack years: 15.00  Smokeless Tobacco Never Used  Tobacco Comment   Quit in his 2's.    Goals Met:  Independence with exercise equipment Exercise tolerated well No report of cardiac concerns or symptoms Strength training completed today  Goals Unmet:  Not Applicable  Comments: Pt able to follow exercise prescription today without complaint.  Will continue to monitor for progression.    Dr. Emily Filbert is Medical Director for [place] [facility].

## 2017-04-28 ENCOUNTER — Encounter: Payer: Self-pay | Admitting: *Deleted

## 2017-04-28 DIAGNOSIS — I214 Non-ST elevation (NSTEMI) myocardial infarction: Secondary | ICD-10-CM

## 2017-04-28 NOTE — Progress Notes (Signed)
Daily Session Note  Patient Details  Name: Adam Douglas MRN: [id number] Date of Birth: [birth year] Referring Provider:     Cardiac Rehab from 03/22/2017 in [facility] Cardiac and Pulmonary Rehab  Referring Provider  Ida Rogue MD      Encounter Date: 04/28/2017  Check In: Session Check In - 04/28/17 0804      Check-In   Location  [facility]Cardiac [facility] Pulmonary Re[facility]    Staff Present  Justin Mend RCP,RRT,BSRT;Heath Lark, RN, BSN, CCRP;Jessica Luan Pulling, MA, RCEP, CCRP, Exercise Physiologist    Supervising physician immediately available to respond to emergencies  See telemetry face sheet for immediately available ER MD    Medication changes reported      No    Fall or balance concerns reported     No    Tobacco Cessation  No Change    Warm-up and Cool-down  Performed on first and last piece of equipment    Resistance Training Performed  Yes    VAD Patient?  No      Pain Assessment   Currently in Pain?  No/denies          Social History   Tobacco Use  Smoking Status Former Smoker  . Packs/day: 1.00  . Years: 15.00  . Pack years: 15.00  Smokeless Tobacco Never Used  Tobacco Comment   Quit in his 75's.    Goals Met:  Independence with exercise equipment Exercise tolerated well No report of cardiac concerns or symptoms Strength training completed today  Goals Unmet:  Not Applicable  Comments: Pt able to follow exercise prescription today without complaint.  Will continue to monitor for progression.   Dr. Emily Filbert is Medical Director for [place] [facility].

## 2017-04-28 NOTE — Progress Notes (Signed)
Cardiac Individual Treatment Plan  Patient Details  Name: Adam Douglas MRN: [id number] Date of Birth: 02/06/1950 Referring Provider:     Cardiac Rehab from 03/22/2017 in [facility] Cardiac and Pulmonary Rehab  Referring Provider  Ida Rogue MD      Initial Encounter Date:    Cardiac Rehab from 03/22/2017 in [facility] Cardiac and Pulmonary Rehab  Date  03/22/17  Referring Provider  Ida Rogue MD      Visit Diagnosis: NSTEMI (non-ST elevated myocardial infarction) [facility])  Patient's Home Medications on Admission:  Current Outpatient Medications:  .  aspirin EC 81 MG tablet, Take 1 tablet (81 mg total) by mouth daily., Disp: 90 tablet, Rfl: 3 .  atorvastatin (LIPITOR) 40 MG tablet, Take 80 mg by mouth daily., Disp: , Rfl:  .  carvedilol (COREG) 6.25 MG tablet, Take 1 tablet (6.25 mg total) by mouth 2 (two) times daily with a meal., Disp: 180 tablet, Rfl: 2 .  citalopram (CELEXA) 40 MG tablet, Take 40 mg by mouth daily., Disp: , Rfl:  .  clopidogrel (PLAVIX) 75 MG tablet, Take 1 tablet (75 mg total) by mouth daily with breakfast., Disp: 90 tablet, Rfl: 2 .  furosemide (LASIX) 20 MG tablet, Take 1 tablet (20 mg total) by mouth daily., Disp: 90 tablet, Rfl: 2 .  insulin aspart (NOVOLOG) 100 UNIT/ML injection, Inject 15 Units into the skin 3 (three) times daily with meals., Disp: , Rfl:  .  insulin NPH-regular Human (NOVOLIN 70/30) (70-30) 100 UNIT/ML injection, Inject 20-50 Units into the skin 2 (two) times daily with a meal. 30 units in the morning and 20 at night, Disp: 10 mL, Rfl: 11 .  isosorbide mononitrate (IMDUR) 30 MG 24 hr tablet, Take 0.5 tablets (15 mg total) by mouth daily., Disp: 45 tablet, Rfl: 2 .  losartan (COZAAR) 25 MG tablet, Take 1 tablet (25 mg total) by mouth daily., Disp: 30 tablet, Rfl: 0 .  metFORMIN (GLUCOPHAGE-XR) 500 MG 24 hr tablet, Take 500 mg by mouth 2 (two) times daily., Disp: , Rfl:  .  omeprazole (PRILOSEC) 20 MG capsule, Take 20 mg by mouth 2 (two) times  daily before a meal., Disp: , Rfl:  .  Polyethyl Glycol-Propyl Glycol (SYSTANE) 0.4-0.3 % SOLN, Apply 1 drop to eye daily., Disp: , Rfl:  .  sacubitril-valsartan (ENTRESTO) 24-26 MG, Take 1 tablet by mouth 2 (two) times daily., Disp: 180 tablet, Rfl: 3 .  traMADol (ULTRAM) 50 MG tablet, Take 50 mg by mouth every 8 (eight) hours as needed for pain., Disp: , Rfl:   Past Medical History: Past Medical History:  Diagnosis Date  . CAD (coronary artery disease)    a. 02/2017 NSTEMI/Cath: LM 40d, LAD 25m 765m, D2 small w/ sev prox/m dzs, LCX nl, OM1 sev diff dzs - small vessel, OM2 80-90p/m, RCA dominant 3050mPDA 50, EF <20%-->Med Rx.  . Diabetes mellitus without complication ([facility] . HFrEF (heart failure with reduced ejection fraction) ([facility]  a. 02/2017 Echo: EF 20-25%.  . HMarland Kitchenperlipidemia   . Hypertension   . Ischemic cardiomyopathy    a. 02/2017 Echo: EF 20-25%, diff HK, Gr2 DD, mild MR.  . LBBB (left bundle branch block)    a. Noted 02/2017. Pt denies any known prior history of LBBB (not present on 2014 [facility] ECG interpretation).  . Morbid [facility] . [facility]  [facility]    Tobacco Use: Social History   Tobacco Use  Smoking Status Former Smoker  . Packs/day: 1.00  . Years: 15.00  . Pack years: 15.00  Smokeless Tobacco Never Used  Tobacco Comment   Quit in his 90's.    Labs: Recent Review Flowsheet Data    Labs for ITP Cardiac and Pulmonary Rehab Latest Ref Rng & Units 03/02/2017 03/03/2017 03/04/2017   Cholestrol 0 - 200 mg/dL - - 94   LDLCALC 0 - 99 mg/dL - - 36   HDL >40 mg/dL - - 35(L)   Trlycerides <150 mg/dL - - 117   Hemoglobin A1c 4.8 - 5.6 % - 7.1(H) -   HCO3 20.0 - 28.0 mmol/L 22.1 - -   ACIDBASEDEF 0.0 - 2.0 mmol/L 1.7 - -   O2SAT % 91.1 - -       Exercise Target Goals:    Exercise Program Goal: Individual exercise prescription set using results from initial 6 min walk test and  THRR while considering  patient's activity barriers and safety.   Exercise Prescription Goal: Initial exercise prescription builds to 30-45 minutes a day of aerobic activity, 2-3 days per week.  Home exercise guidelines will be given to patient during program as part of exercise prescription that the participant will acknowledge.  Activity Barriers & Risk Stratification: Activity Barriers & Cardiac Risk Stratification - 03/22/17 1333      Activity Barriers & Cardiac Risk Stratification   Activity Barriers  Arthritis;Shortness of Breath;Deconditioning;Muscular Weakness    Cardiac Risk Stratification  High       6 Minute Walk: 6 Minute Walk    Row Name 03/22/17 1404         6 Minute Walk   Phase  Initial     Distance  1085 feet     Walk Time  6 minutes     # of Rest Breaks  0     MPH  2.06     METS  2.41     RPE  13     VO2 Peak  8.45     Symptoms  No     Resting HR  5 bpm     Resting BP  126/72     Resting Oxygen Saturation   97 %     Exercise Oxygen Saturation  during 6 min walk  98 %     Max Ex. HR  88 bpm     Max Ex. BP  152/64     2 Minute Post BP  134/64        Oxygen Initial Assessment:   Oxygen Re-Evaluation:   Oxygen Discharge (Final Oxygen Re-Evaluation):   Initial Exercise Prescription: Initial Exercise Prescription - 03/22/17 1400      Date of Initial Exercise RX and Referring Provider   Date  03/22/17    Referring Provider  Ida Rogue MD      Treadmill   MPH  1.8    Grade  0.5    Minutes  15    METs  2.5      Recumbant Bike   Level  1    RPM  50    Watts  14    Minutes  15    METs  2      NuStep   Level  1    SPM  80    Minutes  15    METs  2      Prescription Details   Frequency (  times per week)  3    Duration  Progress to 45 minutes of aerobic exercise without signs/symptoms of physical distress      Intensity   THRR 40-80% of Max Heartrate  100-135    Ratings of Perceived Exertion  11-13    Perceived Dyspnea  0-4       Progression   Progression  Continue to progress workloads to maintain intensity without signs/symptoms of physical distress.      Resistance Training   Training Prescription  Yes    Weight  3 lbs    Reps  10-15       Perform Capillary Blood Glucose checks as needed.  Exercise Prescription Changes: Exercise Prescription Changes    Row Name 03/22/17 1300 04/05/17 1600 04/16/17 0800 04/21/17 1200       Response to Exercise   Blood Pressure (Admit)  126/72  128/70  -  104/52    Blood Pressure (Exercise)  152/64  134/60  -  158/54    Blood Pressure (Exit)  134/64  116/60  -  106/56    Heart Rate (Admit)  64 bpm  75 bpm  -  78 bpm    Heart Rate (Exercise)  88 bpm  101 bpm  -  116 bpm    Heart Rate (Exit)  68 bpm  73 bpm  -  70 bpm    Oxygen Saturation (Admit)  97 %  -  -  -    Oxygen Saturation (Exercise)  98 %  -  -  -    Rating of Perceived Exertion (Exercise)  13  14  -  14    Symptoms  none  none  -  none    Comments  walk test results  -  -  -    Duration  -  Continue with 45 min of aerobic exercise without signs/symptoms of physical distress.  -  Continue with 45 min of aerobic exercise without signs/symptoms of physical distress.    Intensity  -  THRR unchanged  -  THRR unchanged      Progression   Progression  -  Continue to progress workloads to maintain intensity without signs/symptoms of physical distress.  -  Continue to progress workloads to maintain intensity without signs/symptoms of physical distress.    Average METs  -  2.84  -  3.07      Resistance Training   Training Prescription  -  Yes  -  Yes    Weight  -  3 lbs  -  7 lbs    Reps  -  10-15  -  10-15      Interval Training   Interval Training  -  No  -  No      Treadmill   MPH  -  1.8  -  1.8    Grade  -  0.5  -  0.5    Minutes  -  15  -  15    METs  -  2.5  -  2.5      Recumbant Bike   Level  -  1  -  1    Watts  -  22  -  55    Minutes  -  15  -  15    METs  -  2.63  -  3.73      NuStep    Level  -  4  -  4  Minutes  -  15  -  15    METs  -  3.4  -  3.6      Home Exercise Plan   Plans to continue exercise at  -  -  Home (comment) walking  Home (comment) walking    Frequency  -  -  Add 2 additional days to program exercise sessions.  Add 2 additional days to program exercise sessions.    Initial Home Exercises Provided  -  -  04/16/17  04/16/17       Exercise Comments: Exercise Comments    Row Name 03/26/17 [phone]           Exercise Comments   First full day of exercise!  Patient was oriented to gym and equipment including functions, settings, policies, and procedures.  Patient's individual exercise prescription and treatment plan were reviewed.  All starting workloads were established based on the results of the 6 minute walk test done at initial orientation visit.  The plan for exercise progression was also introduced and progression will be customized based on patient's performance and goals.          Exercise Goals and Review: Exercise Goals    Row Name 03/22/17 1408             Exercise Goals   Increase Physical Activity  Yes       Intervention  Provide advice, education, support and counseling about physical activity/exercise needs.;Develop an individualized exercise prescription for aerobic and resistive training based on initial evaluation findings, risk stratification, comorbidities and participant's personal goals.       Expected Outcomes  Short Term: Attend rehab on a regular basis to increase amount of physical activity.;Long Term: Add in home exercise to make exercise part of routine and to increase amount of physical activity.;Long Term: Exercising regularly at least 3-5 days a week.       Increase Strength and Stamina  Yes       Intervention  Provide advice, education, support and counseling about physical activity/exercise needs.;Develop an individualized exercise prescription for aerobic and resistive training based on initial evaluation findings,  risk stratification, comorbidities and participant's personal goals.       Expected Outcomes  Short Term: Increase workloads from initial exercise prescription for resistance, speed, and METs.;Short Term: Perform resistance training exercises routinely during rehab and add in resistance training at home;Long Term: Improve cardiorespiratory fitness, muscular endurance and strength as measured by increased METs and functional capacity (6MWT)       Able to understand and use rate of perceived exertion (RPE) scale  Yes       Intervention  Provide education and explanation on how to use RPE scale       Expected Outcomes  Short Term: Able to use RPE daily in rehab to express subjective intensity level;Long Term:  Able to use RPE to guide intensity level when exercising independently       Knowledge and understanding of Target Heart Rate Range (THRR)  Yes       Intervention  Provide education and explanation of THRR including how the numbers were predicted and where they are located for reference       Expected Outcomes  Short Term: Able to state/look up THRR;Long Term: Able to use THRR to govern intensity when exercising independently;Short Term: Able to use daily as guideline for intensity in rehab       Able to check pulse independently  Yes  Intervention  Provide education and demonstration on how to check pulse in carotid and radial arteries.;Review the importance of being able to check your own pulse for safety during independent exercise       Expected Outcomes  Short Term: Able to explain why pulse checking is important during independent exercise;Long Term: Able to check pulse independently and accurately       Understanding of Exercise Prescription  Yes       Intervention  Provide education, explanation, and written materials on patient's individual exercise prescription       Expected Outcomes  Short Term: Able to explain program exercise prescription;Long Term: Able to explain home exercise  prescription to exercise independently          Exercise Goals Re-Evaluation : Exercise Goals Re-Evaluation    Row Name 03/26/17 0856 04/05/17 1604 04/16/17 0806 04/21/17 1230       Exercise Goal Re-Evaluation   Exercise Goals Review  Increase Physical Activity;Increase Strength and Stamina;Able to understand and use rate of perceived exertion (RPE) scale;Knowledge and understanding of Target Heart Rate Range (THRR)  Increase Physical Activity;Understanding of Exercise Prescription;Increase Strength and Stamina  Increase Physical Activity;Understanding of Exercise Prescription;Increase Strength and Stamina;Able to understand and use rate of perceived exertion (RPE) scale;Knowledge and understanding of Target Heart Rate Range (THRR);Able to check pulse independently  Increase Physical Activity;Understanding of Exercise Prescription;Increase Strength and Stamina    Comments  Reviewed RPE scale, THR and program prescription with pt today.  Pt voiced understanding and was given a copy of goals to take home.   Jacob is off to a good start in rehab.  He is up to 22 watts on the recumbent bike and level 4 on the NuStep.  He seems to just go with the flow.  We will continue to monitor his progress.   Najir is doing well in rehab.  He is feeling stronger and has more stamina already.  He has not yet started to add in any home exercise.  So we reviewed home exercise with pt today.  Pt plans to walk at home with his wife for exercise.  Reviewed THR, pulse, RPE, sign and symptoms, NTG use, and when to call 911 or MD.  Also discussed weather considerations and indoor options.  Pt voiced understanding.  Duan continues to do well in rehab.  He is now up to 55 watts on the recumbent bike.  We will continue to monitor his progression.     Expected Outcomes  Short: Use RPE daily to regulate intensity.  Long: Follow program prescription in THR.  Short: Continue to attend regularly and review home exercise guidelines  Long: Exercise more at home too.   Short: Add in at least two days a week of walking at home.  Long: Continue to increase activity levels!!  Short: Add in at least two days a week of walking at home.  Long: Continue to increase activity levels!!       Discharge Exercise Prescription (Final Exercise Prescription Changes): Exercise Prescription Changes - 04/21/17 1200      Response to Exercise   Blood Pressure (Admit)  104/52    Blood Pressure (Exercise)  158/54    Blood Pressure (Exit)  106/56    Heart Rate (Admit)  78 bpm    Heart Rate (Exercise)  116 bpm    Heart Rate (Exit)  70 bpm    Rating of Perceived Exertion (Exercise)  14    Symptoms  none  Duration  Continue with 45 min of aerobic exercise without signs/symptoms of physical distress.    Intensity  THRR unchanged      Progression   Progression  Continue to progress workloads to maintain intensity without signs/symptoms of physical distress.    Average METs  3.07      Resistance Training   Training Prescription  Yes    Weight  7 lbs    Reps  10-15      Interval Training   Interval Training  No      Treadmill   MPH  1.8    Grade  0.5    Minutes  15    METs  2.5      Recumbant Bike   Level  1    Watts  55    Minutes  15    METs  3.73      NuStep   Level  4    Minutes  15    METs  3.6      Home Exercise Plan   Plans to continue exercise at  Home (comment) walking    Frequency  Add 2 additional days to program exercise sessions.    Initial Home Exercises Provided  04/16/17       Nutrition:  Target Goals: Understanding of nutrition guidelines, daily intake of sodium <1581m, cholesterol <2027m calories 30% from fat and 7% or less from saturated fats, daily to have 5 or more servings of fruits and vegetables.  Biometrics: Pre Biometrics - 03/22/17 1408      Pre Biometrics   Height  5' 9.75" (1.772 m)    Weight  227 lb 6.4 oz (103.1 kg)    Waist Circumference  43.5 inches    Hip Circumference  40.25  inches    Waist to Hip Ratio  1.08 %    BMI (Calculated)  32.85    Single Leg Stand  1.1 seconds        Nutrition Therapy Plan and Nutrition Goals: Nutrition Therapy & Goals - 04/08/17 1515      Nutrition Therapy   Diet  Instructed patient (accompanied by his wife) on a meal plan based on heart healthy dietary guidelines and including diabetes dietary guidelines.    Drug/Food Interactions  Statins/Certain Fruits    Protein (specify units)  8    Fiber  30 grams    Whole Grain Foods  3 servings    Saturated Fats  14 max. grams    Fruits and Vegetables  5 servings/day    Sodium  1500 grams      Personal Nutrition Goals   Nutrition Goal  Read food labels for carbohydrate, sodium and saturated fat.    Personal Goal #2  Space meals 4-5 hours apart. Keep carbohydrate in 30-60 gram range at meals.    Personal Goal #3  If dinner is 6:00 or earlier, include a bedtime snack consisting of 1-2 oz protein and 1 serving of carbohydrate.      Intervention Plan   Intervention  Prescribe, educate and counsel regarding individualized specific dietary modifications aiming towards targeted core components such as weight, hypertension, lipid management, diabetes, heart failure and other comorbidities.;Nutrition handout(s) given to patient.    Expected Outcomes  Short Term Goal: Understand basic principles of dietary content, such as calories, fat, sodium, cholesterol and nutrients.;Short Term Goal: A plan has been developed with personal nutrition goals set during dietitian appointment.;Long Term Goal: Adherence to prescribed nutrition plan.  Nutrition Assessments: Nutrition Assessments - 03/22/17 1104      MEDFICTS Scores   Pre Score  6       Nutrition Goals Re-Evaluation: Nutrition Goals Re-Evaluation    Row Name 04/16/17 0815             Goals   Current Weight  220 lb (99.8 kg)  (Pended)        Nutrition Goal  Read food labels for carbohydrate, sodium and saturated fat.  Spacing  meals better, Bedtime snack   (Pended)        Comment  Munir has been working on his diet and is losing weight.  They have really been watching the food labels and checking for fat and sodium.  He is doing better with meal timing.  He has been trying to get bedtime snack, but was eating Jello.  We talked about changing to a protein based snack like peanut butter crackers or cheese.  He said that he is not a fan of yogurt.    (Pended)        Expected Outcome  Short: Protein based bedtime snack.  Long: Continue to read labels and work on heart healthy diet.   (Pended)           Nutrition Goals Discharge (Final Nutrition Goals Re-Evaluation): Nutrition Goals Re-Evaluation - 04/16/17 0815      Goals   Current Weight  220 lb (99.8 kg)  (Pended)     Nutrition Goal  Read food labels for carbohydrate, sodium and saturated fat.  Spacing meals better, Bedtime snack   (Pended)     Comment  Jesusmanuel has been working on his diet and is losing weight.  They have really been watching the food labels and checking for fat and sodium.  He is doing better with meal timing.  He has been trying to get bedtime snack, but was eating Jello.  We talked about changing to a protein based snack like peanut butter crackers or cheese.  He said that he is not a fan of yogurt.    (Pended)     Expected Outcome  Short: Protein based bedtime snack.  Long: Continue to read labels and work on heart healthy diet.   (Pended)        Psychosocial: Target Goals: Acknowledge presence or absence of significant depression and/or stress, maximize coping skills, provide positive support system. Participant is able to verbalize types and ability to use techniques and skills needed for reducing stress and depression.   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 This and his other health issues make it hard to participate in activities around the house that he used to do. 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 Setting desired goal for each stressor or current issue identified.;Long Term Goal: Stressors or current issues are controlled or eliminated.;Short Term goal: Identification and review with participant of any Quality of Life or Depression concerns found by scoring the questionnaire.;Long Term goal: The participant improves quality of Life and PHQ9 Scores as seen by post scores and/or verbalization of changes       Quality of Life Scores:  Quality of Life - 03/22/17 1103      Quality of Life Scores   Health/Function Pre  22.07 %    Socioeconomic Pre  25 %    Psych/Spiritual Pre  30 %    Family Pre  26 %    GLOBAL Pre  24.97 %      Scores of 19 and below usually indicate a poorer quality of life in these areas.  A difference of  2-3 points is a clinically meaningful difference.  A difference of 2-3 points in the total score of the Quality of Life Index has been associated with significant improvement in overall quality of life, self-image, physical symptoms, and general health in studies assessing change in quality of life.  PHQ-9: Recent Review Flowsheet Data    Depression screen [facility] 2/9 03/22/2017    Decreased Interest 0   Down, Depressed, Hopeless 0   PHQ - 2 Score 0   Altered sleeping 0   Tired, decreased energy 1   Change in appetite 0   Feeling bad or failure about yourself  0   Trouble concentrating 1   Moving slowly or fidgety/restless 1   Suicidal thoughts 0   PHQ-9 Score 3   Difficult doing work/chores Somewhat difficult     Interpretation of Total Score  Total Score Depression Severity:  1-4 = Minimal depression, 5-9 = Mild depression, 10-14 = Moderate depression, 15-19 = Moderately severe depression, 20-27 = Severe depression   Psychosocial Evaluation and Intervention: Psychosocial Evaluation - 04/12/17 0959      Psychosocial Evaluation & Interventions   Interventions  Encouraged to exercise with the program and follow exercise prescription    Comments  Counselor met with Mr. Toney Lorretta Harp) today for initial psychosocial evaluation.  He is a 67 year old who had a heart attack recently.  He has a strong support system with a spouse of "many years" and (3) adult daughters.  Seamus is also actively involved in his local church.   He struggles with being overweight and has diabetes as well as his cardiac issues.  He sleeps well and has a good appetite.  Halden denies a history of depression or anxity or any current symptoms; and is typically in a positive mood.  Other than his health, Jorian reports having minimal stress in his life.  Cornel has goals to increase his stamina and strength and possibly lose some weight while in this program.   He will be followed by staff.     Expected Outcomes  Short - Carrel will meet with the dietician to begin to address his weight loss goals.  Long:  Ellington will exercise consistently increase his stamina and strength.      Continue Psychosocial Services   Follow up required by staff       Psychosocial Re-Evaluation:   Psychosocial Discharge (Final Psychosocial Re-Evaluation):   Vocational Rehabilitation: Provide vocational rehab  assistance to qualifying candidates.   Vocational Rehab Evaluation & Intervention: Vocational Rehab - 03/22/17 1332      Initial Vocational Rehab  Evaluation & Intervention   Assessment shows need for Vocational Rehabilitation  No       Education: Education Goals: Education classes will be provided on a variety of topics geared toward better understanding of heart health and risk factor modification. Participant will state understanding/return demonstration of topics presented as noted by education test scores.  Learning Barriers/Preferences: Learning Barriers/Preferences - 03/22/17 1332      Learning Barriers/Preferences   Learning Barriers  Hearing;Sight;Reading    Learning Preferences  Individual Instruction;Verbal Instruction       Education Topics:  AED/CPR: - Group verbal and written instruction with the use of models to demonstrate the basic use of the AED with the basic ABC's of resuscitation.   Cardiac Rehab from 04/21/2017 in [facility] Cardiac and Pulmonary Rehab  Date  04/07/17  Educator  SB  Instruction Review Code  1- Verbalizes Understanding      General Nutrition Guidelines/Fats and Fiber: -Group instruction provided by verbal, written material, models and posters to present the general guidelines for heart healthy nutrition. Gives an explanation and review of dietary fats and fiber.   Cardiac Rehab from 04/21/2017 in [facility] Cardiac and Pulmonary Rehab  Date  03/29/17  Educator  CR  Instruction Review Code  1- Verbalizes Understanding      Controlling Sodium/Reading Food Labels: -Group verbal and written material supporting the discussion of sodium use in heart healthy nutrition. Review and explanation with models, verbal and written materials for utilization of the food label.   Cardiac Rehab from 04/21/2017 in [facility] Cardiac and Pulmonary Rehab  Date  04/05/17  Educator  CR  Instruction Review Code  1- Verbalizes Understanding      Exercise Physiology & General  Exercise Guidelines: - Group verbal and written instruction with models to review the exercise physiology of the cardiovascular system and associated critical values. Provides general exercise guidelines with specific guidelines to those with heart or lung disease.    Cardiac Rehab from 04/21/2017 in [facility] Cardiac and Pulmonary Rehab  Date  04/14/17  Educator  [facility]  Instruction Review Code  1- Verbalizes Understanding      Aerobic Exercise & Resistance Training: - Gives group verbal and written instruction on the various components of exercise. Focuses on aerobic and resistive training programs and the benefits of this training and how to safely progress through these programs..   Cardiac Rehab from 04/21/2017 in [facility] Cardiac and Pulmonary Rehab  Date  04/19/17  Educator  [facility]  Instruction Review Code  1- Verbalizes Understanding      Flexibility, Balance, Mind/Body Relaxation: Provides group verbal/written instruction on the benefits of flexibility and balance training, including mind/body exercise modes such as yoga, pilates and tai chi.  Demonstration and skill practice provided.   Stress and Anxiety: - Provides group verbal and written instruction about the health risks of elevated stress and causes of high stress.  Discuss the correlation between heart/lung disease and anxiety and treatment options. Review healthy ways to manage with stress and anxiety.   Depression: - Provides group verbal and written instruction on the correlation between heart/lung disease and depressed mood, treatment options, and the stigmas associated with seeking treatment.   Cardiac Rehab from 04/21/2017 in [facility] Cardiac and Pulmonary Rehab  Date  04/21/17  Educator  [facility]  Instruction Review Code  1- Verbalizes Understanding      Anatomy & Physiology of the Heart: - Group verbal and written instruction and models provide basic cardiac anatomy and physiology, with the coronary electrical  and arterial systems.  Review of Valvular disease and Heart Failure   Cardiac Procedures: - Group verbal and written instruction to review commonly prescribed medications for heart disease. Reviews the medication, class of the drug, and side effects. Includes the steps to properly store meds and maintain the prescription regimen. (beta blockers and nitrates)   Cardiac Rehab from 04/21/2017 in [facility] Cardiac and Pulmonary Rehab  Date  03/31/17  Educator  SB  Instruction Review Code  1- Verbalizes Understanding      Cardiac Medications I: - Group verbal and written instruction to review commonly prescribed medications for heart disease. Reviews the medication, class of the drug, and side effects. Includes the steps to properly store meds and maintain the prescription regimen.   Cardiac Medications II: -Group verbal and written instruction to review commonly prescribed medications for heart disease. Reviews the medication, class of the drug, and side effects. (all other drug classes)    Go Sex-Intimacy & Heart Disease, Get SMART - Goal Setting: - Group verbal and written instruction through game format to discuss heart disease and the return to sexual intimacy. Provides group verbal and written material to discuss and apply goal setting through the application of the S.M.A.R.T. Method.   Cardiac Rehab from 04/21/2017 in [facility] Cardiac and Pulmonary Rehab  Date  03/31/17  Educator  SB  Instruction Review Code  1- Verbalizes Understanding      Other Matters of the Heart: - Provides group verbal, written materials and models to describe Stable Angina and Peripheral Artery. Includes description of the disease process and treatment options available to the cardiac patient.   Exercise & Equipment Safety: - Individual verbal instruction and demonstration of equipment use and safety with use of the equipment.   Cardiac Rehab from 04/21/2017 in [facility] Cardiac and Pulmonary Rehab  Date  03/22/17  Educator  [facility]  Instruction  Review Code  1- Verbalizes Understanding      Infection Prevention: - Provides verbal and written material to individual with discussion of infection control including proper hand washing and proper equipment cleaning during exercise session.   Cardiac Rehab from 04/21/2017 in [facility] Cardiac and Pulmonary Rehab  Date  03/22/17  Educator  [facility]  Instruction Review Code  1- Verbalizes Understanding      Falls Prevention: - Provides verbal and written material to individual with discussion of falls prevention and safety.   Cardiac Rehab from 04/21/2017 in [facility] Cardiac and Pulmonary Rehab  Date  03/22/17  Educator  [facility]  Instruction Review Code  1- Verbalizes Understanding      Diabetes: - Individual verbal and written instruction to review signs/symptoms of diabetes, desired ranges of glucose level fasting, after meals and with exercise. Acknowledge that pre and post exercise glucose checks will be done for 3 sessions at entry of program.   Cardiac Rehab from 04/21/2017 in [facility] Cardiac and Pulmonary Rehab  Date  03/22/17  Educator  [facility]  Instruction Review Code  1- Verbalizes Understanding      Know Your Numbers and Risk Factors: -Group verbal and written instruction about important numbers in your health.  Discussion of what are risk factors and how they play a role in the disease process.  Review of Cholesterol, Blood Pressure, Diabetes, and BMI and the role they play in your overall health.   Sleep Hygiene: -Provides group verbal and written instruction about how sleep can affect your health.  Define sleep hygiene, discuss sleep cycles and impact of sleep habits. Review good sleep  hygiene tips.    Other: -Provides group and verbal instruction on various topics (see comments)   Knowledge Questionnaire Score: Knowledge Questionnaire Score - 03/22/17 1104      Knowledge Questionnaire Score   Pre Score  21/28 correct answers reviewed with Bowie       Core Components/Risk  Factors/Patient Goals at Admission: Personal Goals and Risk Factors at Admission - 03/22/17 1325      Core Components/Risk Factors/Patient Goals on Admission    Weight Management  Yes;Weight Loss    Intervention  Weight Management: Develop a combined nutrition and exercise program designed to reach desired caloric intake, while maintaining appropriate intake of nutrient and fiber, sodium and fats, and appropriate energy expenditure required for the weight goal.;Weight Management: Provide education and appropriate resources to help participant work on and attain dietary goals.;Weight Management/Obesity: Establish reasonable short term and long term weight goals.    Admit Weight  226 lb (102.5 kg)    Goal Weight: Short Term  222 lb (100.7 kg)    Goal Weight: Long Term  199 lb (90.3 kg)    Expected Outcomes  Short Term: Continue to assess and modify interventions until short term weight is achieved;Long Term: Adherence to nutrition and physical activity/exercise program aimed toward attainment of established weight goal;Weight Loss: Understanding of general recommendations for a balanced deficit meal plan, which promotes 1-2 lb weight loss per week and includes a negative energy balance of [phone] kcal/d;Understanding recommendations for meals to include 15-35% energy as protein, 25-35% energy from fat, 35-60% energy from carbohydrates, less than 2100m of dietary cholesterol, 20-35 gm of total fiber daily;Understanding of distribution of calorie intake throughout the day with the consumption of 4-5 meals/snacks    Diabetes  Yes    Intervention  Provide education about signs/symptoms and action to take for hypo/hyperglycemia.;Provide education about proper nutrition, including hydration, and aerobic/resistive exercise prescription along with prescribed medications to achieve blood glucose in normal ranges: Fasting glucose 65-99 mg/dL    Expected Outcomes  Short Term: Participant verbalizes understanding of  the signs/symptoms and immediate care of hyper/hypoglycemia, proper foot care and importance of medication, aerobic/resistive exercise and nutrition plan for blood glucose control.;Long Term: Attainment of HbA1C < 7%.    Heart Failure  Yes    Intervention  Provide a combined exercise and nutrition program that is supplemented with education, support and counseling about heart failure. Directed toward relieving symptoms such as shortness of breath, decreased exercise tolerance, and extremity edema.    Expected Outcomes  Improve functional capacity of life    Hypertension  Yes    Intervention  Provide education on lifestyle modifcations including regular physical activity/exercise, weight management, moderate sodium restriction and increased consumption of fresh fruit, vegetables, and low fat dairy, alcohol moderation, and smoking cessation.;Monitor prescription use compliance.    Expected Outcomes  Short Term: Continued assessment and intervention until BP is < 140/97mHG in hypertensive participants. < 130/8038mG in hypertensive participants with diabetes, heart failure or chronic kidney disease.;Long Term: Maintenance of blood pressure at goal levels.    Lipids  Yes    Intervention  Provide education and support for participant on nutrition & aerobic/resistive exercise along with prescribed medications to achieve LDL <56m99mDL >40mg45m Expected Outcomes  Short Term: Participant states understanding of desired cholesterol values and is compliant with medications prescribed. Participant is following exercise prescription and nutrition guidelines.;Long Term: Cholesterol controlled with medications as prescribed, with individualized exercise RX and with personalized nutrition  plan. Value goals: LDL < 75m, HDL > 40 mg.    Stress  Yes DJoabhas not been able to participate in yard work or perform househole activities like he used to due to his 3 strokes and other health issues    Intervention  Offer  individual and/or small group education and counseling on adjustment to heart disease, stress management and health-related lifestyle change. Teach and support self-help strategies.;Refer participants experiencing significant psychosocial distress to appropriate mental health specialists for further evaluation and treatment. When possible, include family members and significant others in education/counseling sessions.    Expected Outcomes  Short Term: Participant demonstrates changes in health-related behavior, relaxation and other stress management skills, ability to obtain effective social support, and compliance with psychotropic medications if prescribed.;Long Term: Emotional wellbeing is indicated by absence of clinically significant psychosocial distress or social isolation.       Core Components/Risk Factors/Patient Goals Review:  Goals and Risk Factor Review    Row Name 04/16/17 0811             Core Components/Risk Factors/Patient Goals Review   Personal Goals Review  Weight Management/Obesity;Hypertension;Lipids;Diabetes       Review  DAmeiris doing good in rehab.  He is losing weight and has already met his short term goal as he is down to 220lbs.  He has been really watching his diet and will now add in more exercise at home to help some more.  Blood sugars have been running on the lower side and he checks them twice a day.  He was 74 this morning!!  Blood presures have good too and he has been checking them twice a day.  He is doing well on his medications.       Expected Outcomes  Short: Keep watching blood sugars.  Long: Continue to work on weight loss.           Core Components/Risk Factors/Patient Goals at Discharge (Final Review):  Goals and Risk Factor Review - 04/16/17 0811      Core Components/Risk Factors/Patient Goals Review   Personal Goals Review  Weight Management/Obesity;Hypertension;Lipids;Diabetes    Review  DVicentis doing good in rehab.  He is losing weight  and has already met his short term goal as he is down to 220lbs.  He has been really watching his diet and will now add in more exercise at home to help some more.  Blood sugars have been running on the lower side and he checks them twice a day.  He was 74 this morning!!  Blood presures have good too and he has been checking them twice a day.  He is doing well on his medications.    Expected Outcomes  Short: Keep watching blood sugars.  Long: Continue to work on weight loss.        ITP Comments: ITP Comments    Row Name 03/22/17 1308 03/31/17 0605 04/28/17 0606       ITP Comments  Med Review completed. Initial ITP created. [facility]  [facility] Day review. Continue with ITP unless directed changes per Medical Director review.  New to program  30 day review. Continue with ITP unless directed changes per Medical Director        Comments:

## 2017-04-30 ENCOUNTER — Encounter: Payer: Medicare HMO | Admitting: *Deleted

## 2017-04-30 DIAGNOSIS — I214 Non-ST elevation (NSTEMI) myocardial infarction: Secondary | ICD-10-CM | POA: Diagnosis not present

## 2017-04-30 NOTE — Progress Notes (Signed)
Daily Session Note  Patient Details  Name: Adam Douglas MRN: [id number] Date of Birth: [birth year] Referring Provider:     Cardiac Rehab from 03/22/2017 in [facility] Cardiac and Pulmonary Rehab  Referring Provider  Ida Rogue MD      Encounter Date: 04/30/2017  Check In: Session Check In - 04/30/17 0825      Check-In   Location  [facility]-Cardiac & Pulmonary Rehab    Staff Present  Alberteen Sam, MA, RCEP, CCRP, Exercise Physiologist;Meredith Sherryll Burger, RN Vickki Hearing, BA, ACSM CEP, Exercise Physiologist    Supervising physician immediately available to respond to emergencies  See telemetry face sheet for immediately available ER MD    Medication changes reported      No    Fall or balance concerns reported     No    Warm-up and Cool-down  Performed on first and last piece of equipment    Resistance Training Performed  Yes    VAD Patient?  No      Pain Assessment   Currently in Pain?  No/denies          Social History   Tobacco Use  Smoking Status Former Smoker  . Packs/day: 1.00  . Years: 15.00  . Pack years: 15.00  Smokeless Tobacco Never Used  Tobacco Comment   Quit in his 80's.    Goals Met:  Independence with exercise equipment Exercise tolerated well No report of cardiac concerns or symptoms Strength training completed today  Goals Unmet:  Not Applicable  Comments: Pt able to follow exercise prescription today without complaint.  Will continue to monitor for progression.    Dr. Emily Filbert is Medical Director for [place] [facility].

## 2017-05-01 DIAGNOSIS — I25118 Atherosclerotic heart disease of native coronary artery with other forms of angina pectoris: Secondary | ICD-10-CM | POA: Insufficient documentation

## 2017-05-01 NOTE — Progress Notes (Signed)
Cardiology Office Note  Date:  05/03/2017   ID:  Adam Douglas, Adam Douglas [birth year], MRN [id number]  PCP:  Delton Prairie, MD   Chief Complaint  Patient presents with  . Other    1 month follow up. Meds reviewed by the pt. verbally. 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   Moderate to severe mid and distal LAD disease severe disease of small diagonal vessels, Severe disease of small OM1 and om2 vessels  Culprit vessel unclear given diffuse severe small vessel disease. Medical management recommended Continue heparin until troponin trending down, then will change to plavix lipitor 80 mg daily with asa, coreg/ace for cardiomyopathy Cardiomyopathy appears out of proportion of his disease  Ost 1st Mrg lesion is 80% stenosed.  Ost 2nd Mrg lesion is 85% stenosed.  Prox LAD lesion is 70% stenosed.  Ost 1st Diag lesion is 90% stenosed.  Mid LM lesion is 40% stenosed.  1st Mrg lesion is 90% stenosed.  Mid RCA lesion is 30% stenosed.  The left ventricular ejection fraction is less than 25% by visual estimate.  LV end diastolic pressure is mildly elevated.  There is severe left ventricular systolic dysfunction.  Post Atrio lesion is 50% stenosed.  There is no aortic valve stenosis.  There is no mitral valve stenosis.  Mid LAD lesion is 70% stenosed.  Echocardiogram 02/2017 showed an EF of 20-25%.  Weight is Down 12 pounds,  After dietary  changes No regular exercise program [facility]  He is requesting abdominal aorta ultrasound to rule out AAA  EKG personally reviewed by myself on todays visit Shows normal sinus rhythm rate 65 bpm APCs noted left bundle branch block  PMH:   has a past medical history of CAD (coronary artery disease), Diabetes mellitus without complication (HCC), HFrEF (heart failure with reduced ejection fraction) (HCC), Hyperlipidemia, Hypertension, Ischemic cardiomyopathy, LBBB (left bundle branch block), Morbid obesity (HCC), and Stroke (HCC).  PSH:    Past Surgical History:  Procedure Laterality Date  . CERVICAL DISC SURGERY    . CHOLECYSTECTOMY    . LEFT HEART CATH AND CORONARY ANGIOGRAPHY N/A 03/03/2017   Procedure: LEFT HEART CATH AND CORONARY ANGIOGRAPHY;  Surgeon: Antonieta Iba, MD;  Location: [facility] INVASIVE CV LAB;  Service: Cardiovascular;  Laterality: N/A;    Current Outpatient Medications  Medication Sig Dispense Refill  . aspirin EC 81 MG tablet Take 1 tablet (81 mg total) by mouth daily. 90 tablet 3  . atorvastatin (LIPITOR) 40 MG tablet Take 80 mg by mouth daily.    . carvedilol (COREG) 6.25 MG tablet Take 1 tablet (6.25 mg total) by mouth 2 (two) times daily with a meal. 180 tablet 2  . citalopram (CELEXA) 40 MG tablet Take 40 mg by mouth daily.    . clopidogrel (PLAVIX) 75 MG tablet Take 1 tablet (75 mg total) by mouth daily with breakfast. 90 tablet 2  . furosemide (LASIX) 20 MG tablet Take 1 tablet (20 mg  total) by mouth daily. 90 tablet 2  . insulin aspart (NOVOLOG) 100 UNIT/ML injection Inject 15 Units into the skin 3 (three) times daily with meals.    . insulin NPH-regular Human (NOVOLIN 70/30) (70-30) 100 UNIT/ML injection Inject 20-50 Units into the skin 2 (two) times daily with a meal. 30 units in the morning and 20 at night 10 mL 11  . isosorbide mononitrate (IMDUR) 30 MG 24 hr tablet Take 0.5 tablets (15 mg total) by mouth daily. 45 tablet 2   . metFORMIN (GLUCOPHAGE-XR) 500 MG 24 hr tablet Take 500 mg by mouth 2 (two) times daily.    Marland Kitchen omeprazole (PRILOSEC) 20 MG capsule Take 20 mg by mouth 2 (two) times daily before a meal.    . Polyethyl Glycol-Propyl Glycol (SYSTANE) 0.4-0.3 % SOLN Apply 1 drop to eye daily.    . sacubitril-valsartan (ENTRESTO) 24-26 MG Take 1 tablet by mouth 2 (two) times daily. 180 tablet 3  . traMADol (ULTRAM) 50 MG tablet Take 50 mg by mouth every 8 (eight) hours as needed for pain.     No current facility-administered medications for this visit.      Allergies:   Patient has no known allergies.   Social History:  The patient  reports that he has quit smoking. He has a 15.00 pack-year smoking history. He has never used smokeless tobacco. He reports that he does not drink alcohol or use drugs.   Family History:   family history includes CAD in his sister; CAD (age of onset: 43) in his father; CAD (age of onset: 79) in his mother; Heart attack in his father and mother.    Review of Systems: Review of Systems  Constitutional: Negative.   Respiratory: Negative.   Cardiovascular: Negative.   Gastrointestinal: Negative.   Musculoskeletal: Negative.   Neurological: Negative.   Psychiatric/Behavioral: Negative.   All other systems reviewed and are negative.    PHYSICAL EXAM: VS:  BP (!) 112/50 (BP Location: Left Arm, Patient Position: Sitting, Cuff Size: Normal)   Pulse 65   Ht 5' 9.5" (1.765 m)   Wt 219 lb 8 oz (99.6 kg)   BMI 31.95 kg/m  , BMI Body mass index is 31.95 kg/m. Constitutional:  oriented to person, place, and time. No distress.  HENT:  Head: Normocephalic and atraumatic.  Eyes:  no discharge. No scleral icterus.  Neck: Normal range of motion. Neck supple. No JVD present.  Cardiovascular: Normal rate, regular rhythm, normal heart sounds and intact distal pulses. Exam reveals no gallop and no friction rub. No edema No murmur heard. Pulmonary/Chest: Effort normal and breath sounds  normal. No stridor. No respiratory distress.  no wheezes.  no rales.  no tenderness.  Abdominal: Soft.  no distension.  no tenderness.  Musculoskeletal: Normal range of motion.  no  tenderness or deformity.  Neurological:  normal muscle tone. Coordination normal. No atrophy Skin: Skin is warm and dry. No rash noted. not diaphoretic.  Psychiatric:  normal mood and affect. behavior is normal. Thought content normal.    Recent Labs: 03/02/2017: ALT 31; B Natriuretic Peptide 488.0 03/03/2017: TSH 0.895 03/05/2017: Hemoglobin 11.5; Platelets 169 03/09/2017: BUN 26; Creatinine, Ser 1.26; Potassium 4.9; Sodium 139    Lipid Panel Lab Results  Component Value Date   CHOL 94 03/04/2017   HDL 35 (L) 03/04/2017   LDLCALC 36 03/04/2017   TRIG 117 03/04/2017      Wt Readings from Last 3 Encounters:  05/03/17 219 lb 8 oz (99.6  kg)  03/22/17 227 lb 6.4 oz (103.1 kg)  03/09/17 231 lb 4 oz (104.9 kg)       ASSESSMENT AND PLAN:  Atherosclerosis of native coronary artery of native heart with stable angina pectoris (HCC) - Plan: EKG 12-Lead Severe underlying coronary disease discussed with him Cholesterol at goal Repeat echocardiogram end of the summer in 3 months  Ischemic cardiomyopathy Unable to advance his medications given low blood pressure Doing well, weight coming down May need to adjust his medications downward if he continues to lose weight  Chronic systolic heart failure (HCC) Appears relatively euvolemic No changes to his medications  Type 2 diabetes mellitus with other circulatory complication, with long-term current use of insulin (HCC) Sugars dropping overnight Recent 12 pound weight loss Insulin recently adjusted downward, may need to be reduced further  Hyperlipidemia LDL goal <70 Cholesterol is at goal on the current lipid regimen. No changes to the medications were made.  Essential hypertension Blood pressure is well controlled on today's visit. No changes made to  the medications.   Disposition:   F/U  6 months   Total encounter time more than 25 minutes  Greater than 50% was spent in counseling and coordination of care with the patient    Orders Placed This Encounter  Procedures  . EKG 12-Lead     Signed, [name], M.D., Ph.D. 05/03/2017  [facility] [place], [place] [phone]

## 2017-05-03 ENCOUNTER — Encounter: Payer: Self-pay | Admitting: Cardiovascular Disease

## 2017-05-03 ENCOUNTER — Encounter: Payer: Medicare HMO | Admitting: *Deleted

## 2017-05-03 ENCOUNTER — Emergency Department: Payer: Medicare HMO

## 2017-05-03 ENCOUNTER — Ambulatory Visit (INDEPENDENT_AMBULATORY_CARE_PROVIDER_SITE_OTHER): Payer: Medicare HMO | Admitting: Cardiovascular Disease

## 2017-05-03 ENCOUNTER — Other Ambulatory Visit: Payer: Self-pay

## 2017-05-03 ENCOUNTER — Emergency Department
Admission: EM | Admit: 2017-05-03 | Discharge: 2017-05-03 | Disposition: A | Discharge status: Home / Self Care | Payer: Medicare HMO | Attending: Emergency Medicine | Admitting: Emergency Medicine

## 2017-05-03 VITALS — BP 112/50 | HR 65 | Ht 69.5 in | Wt 219.5 lb

## 2017-05-03 DIAGNOSIS — I255 Ischemic cardiomyopathy: Secondary | ICD-10-CM

## 2017-05-03 DIAGNOSIS — W298XXA Contact with other powered powered hand tools and household machinery, initial encounter: Secondary | ICD-10-CM | POA: Diagnosis not present

## 2017-05-03 DIAGNOSIS — Y999 Unspecified external cause status: Secondary | ICD-10-CM | POA: Diagnosis not present

## 2017-05-03 DIAGNOSIS — E1159 Type 2 diabetes mellitus with other circulatory complications: Secondary | ICD-10-CM

## 2017-05-03 DIAGNOSIS — I110 Hypertensive heart disease with heart failure: Secondary | ICD-10-CM | POA: Insufficient documentation

## 2017-05-03 DIAGNOSIS — S61412A Laceration without foreign body of left hand, initial encounter: Secondary | ICD-10-CM | POA: Diagnosis not present

## 2017-05-03 DIAGNOSIS — I252 Old myocardial infarction: Secondary | ICD-10-CM | POA: Diagnosis not present

## 2017-05-03 DIAGNOSIS — Z87891 Personal history of nicotine dependence: Secondary | ICD-10-CM | POA: Diagnosis not present

## 2017-05-03 DIAGNOSIS — Z7902 Long term (current) use of antithrombotics/antiplatelets: Secondary | ICD-10-CM | POA: Diagnosis not present

## 2017-05-03 DIAGNOSIS — Y939 Activity, unspecified: Secondary | ICD-10-CM | POA: Diagnosis not present

## 2017-05-03 DIAGNOSIS — I2510 Atherosclerotic heart disease of native coronary artery without angina pectoris: Secondary | ICD-10-CM | POA: Diagnosis not present

## 2017-05-03 DIAGNOSIS — E785 Hyperlipidemia, unspecified: Secondary | ICD-10-CM | POA: Diagnosis not present

## 2017-05-03 DIAGNOSIS — S6992XA Unspecified injury of left wrist, hand and finger(s), initial encounter: Secondary | ICD-10-CM | POA: Diagnosis present

## 2017-05-03 DIAGNOSIS — E119 Type 2 diabetes mellitus without complications: Secondary | ICD-10-CM | POA: Insufficient documentation

## 2017-05-03 DIAGNOSIS — I10 Essential (primary) hypertension: Secondary | ICD-10-CM | POA: Diagnosis not present

## 2017-05-03 DIAGNOSIS — Y929 Unspecified place or not applicable: Secondary | ICD-10-CM | POA: Diagnosis not present

## 2017-05-03 DIAGNOSIS — I5022 Chronic systolic (congestive) heart failure: Secondary | ICD-10-CM

## 2017-05-03 DIAGNOSIS — Z794 Long term (current) use of insulin: Secondary | ICD-10-CM | POA: Diagnosis not present

## 2017-05-03 DIAGNOSIS — I214 Non-ST elevation (NSTEMI) myocardial infarction: Secondary | ICD-10-CM | POA: Diagnosis not present

## 2017-05-03 DIAGNOSIS — I25118 Atherosclerotic heart disease of native coronary artery with other forms of angina pectoris: Secondary | ICD-10-CM

## 2017-05-03 DIAGNOSIS — Z7982 Long term (current) use of aspirin: Secondary | ICD-10-CM | POA: Diagnosis not present

## 2017-05-03 MED ORDER — CEPHALEXIN 500 MG PO CAPS: 500.0000 mg | ORAL_CAPSULE | Freq: Four times a day (QID) | ORAL | 0 refills | Status: AC

## 2017-05-03 NOTE — ED Provider Notes (Signed)
[facility] Emergency Department Provider Note  ____________________________________________  Time seen: Approximately 2:11 PM  I have reviewed the triage vital signs and the nursing notes.   HISTORY  Chief Complaint Laceration    HPI Adam Douglas is a 67 y.o. male that presents to the emergency department for evaluation of left finger laceration on Saturday.  Patient went to his cardiologist this morning and was told to come to the emergency department to have laceration evaluated.  Wife applied Steri-Strips to laceration after it happened.  He cleaned the laceration with water on Saturday night.  Tetanus shot was a couple months ago.    Past Medical History:  Diagnosis Date  . CAD (coronary artery disease)    a. 02/2017 NSTEMI/Cath: LM 40d, LAD 45m, 77m/d, D2 small w/ sev prox/m dzs, LCX nl, OM1 sev diff dzs - small vessel, OM2 80-90p/m, RCA dominant 18m, RPDA 50, EF <20%-->Med Rx.  . Diabetes mellitus without complication (HCC)   . HFrEF (heart failure with reduced ejection fraction) (HCC)    a. 02/2017 Echo: EF 20-25%.  Marland Kitchen Hyperlipidemia   . Hypertension   . Ischemic cardiomyopathy    a. 02/2017 Echo: EF 20-25%, diff HK, Gr2 DD, mild MR.  . LBBB (left bundle branch block)    a. Noted 02/2017. Pt denies any known prior history of LBBB (not present on 2014 [facility] ECG interpretation).  . Morbid obesity (HCC)   . Stroke [facility])    a. Ocular strokes x 3 - prev eval in [facility] for first 2, [facility] for last one.    Patient Active Problem List   Diagnosis Date Noted  . Ischemic cardiomyopathy 05/03/2017  . Chronic systolic heart failure (HCC) 05/03/2017  . Diabetes mellitus (HCC) 05/03/2017  . Atherosclerosis of native coronary artery of native heart with stable angina pectoris (HCC) 05/01/2017  . NSTEMI (non-ST elevated myocardial infarction) (HCC) 03/03/2017  . Type 2 diabetes mellitus without complication, with long-term current use of insulin (HCC)  06/23/2016  . Essential hypertension 02/18/2015  . Gastroesophageal reflux disease without esophagitis 02/18/2015  . Hyperlipidemia LDL goal <70 02/18/2015  . Anxiety 02/18/2015  . History of stroke 10/27/2012    Past Surgical History:  Procedure Laterality Date  . CERVICAL DISC SURGERY    . CHOLECYSTECTOMY    . LEFT HEART CATH AND CORONARY ANGIOGRAPHY N/A 03/03/2017   Procedure: LEFT HEART CATH AND CORONARY ANGIOGRAPHY;  Surgeon: Antonieta Iba, MD;  Location: [facility] INVASIVE CV LAB;  Service: Cardiovascular;  Laterality: N/A;    Prior to Admission medications   Medication Sig Start Date End Date Taking? Authorizing Provider  aspirin EC 81 MG tablet Take 1 tablet (81 mg total) by mouth daily. 03/09/17   Creig Hines, NP  atorvastatin (LIPITOR) 40 MG tablet Take 80 mg by mouth daily.    [provider]  carvedilol (COREG) 6.25 MG tablet Take 1 tablet (6.25 mg total) by mouth 2 (two) times daily with a meal. 03/09/17   Creig Hines, NP  cephALEXin (KEFLEX) 500 MG capsule Take 1 capsule (500 mg total) by mouth 4 (four) times daily for 10 days. 05/03/17 05/13/17  Enid Derry, PA-C  citalopram (CELEXA) 40 MG tablet Take 40 mg by mouth daily.    [provider]  clopidogrel (PLAVIX) 75 MG tablet Take 1 tablet (75 mg total) by mouth daily with breakfast. 03/09/17   Creig Hines, NP  furosemide (LASIX) 20 MG tablet Take 1 tablet (20 mg total) by  mouth daily. 03/09/17   Creig Hines, NP  insulin aspart (NOVOLOG) 100 UNIT/ML injection Inject 15 Units into the skin 3 (three) times daily with meals.    [provider]  insulin NPH-regular Human (NOVOLIN 70/30) (70-30) 100 UNIT/ML injection Inject 20-50 Units into the skin 2 (two) times daily with a meal. 30 units in the morning and 20 at night 03/06/17   Altamese Dilling, MD  isosorbide mononitrate (IMDUR) 30 MG 24 hr tablet Take 0.5 tablets (15 mg total) by mouth daily.  03/09/17   Creig Hines, NP  metFORMIN (GLUCOPHAGE-XR) 500 MG 24 hr tablet Take 500 mg by mouth 2 (two) times daily.    [provider]  omeprazole (PRILOSEC) 20 MG capsule Take 20 mg by mouth 2 (two) times daily before a meal.    [provider]  Polyethyl Glycol-Propyl Glycol (SYSTANE) 0.4-0.3 % SOLN Apply 1 drop to eye daily.    [provider]  sacubitril-valsartan (ENTRESTO) 24-26 MG Take 1 tablet by mouth 2 (two) times daily. 03/09/17   Creig Hines, NP  traMADol (ULTRAM) 50 MG tablet Take 50 mg by mouth every 8 (eight) hours as needed for pain.    [provider]    Allergies Patient has no known allergies.  Family History  Problem Relation Age of Onset  . CAD Mother 7  . Heart attack Mother   . CAD Father 84  . Heart attack Father   . CAD Sister     Social History Social History   Tobacco Use  . Smoking status: Former Smoker    Packs/day: 1.00    Years: 15.00    Pack years: 15.00  . Smokeless tobacco: Never Used  . Tobacco comment: Quit in his 60's.  Substance Use Topics  . Alcohol use: No  . Drug use: No     Review of Systems  Constitutional: No fever/chills Cardiovascular: No chest pain. Respiratory:  No SOB. Gastrointestinal: No abdominal pain.  No nausea, no vomiting.  Musculoskeletal: Negative for musculoskeletal pain. Skin: Negative for rash, ecchymosis. Positive for laceration Neurological: Negative for numbness or tingling   ____________________________________________   PHYSICAL EXAM:  VITAL SIGNS: ED Triage Vitals  Enc Vitals Group     BP 05/03/17 1237 (!) 133/57     Pulse Rate 05/03/17 1237 (!) 59     Resp 05/03/17 1237 15     Temp 05/03/17 1237 97.7 F (36.5 C)     Temp Source 05/03/17 1237 Oral     SpO2 05/03/17 1237 97 %     Weight 05/03/17 1238 219 lb (99.3 kg)     Height 05/03/17 1238 5\' 9"  (1.753 m)     Head Circumference --      Peak Flow --      Pain Score 05/03/17  1238 0     Pain Loc --      Pain Edu? --      Excl. in GC? --      Constitutional: Alert and oriented. Well appearing and in no acute distress. Eyes: Conjunctivae are normal. PERRL. EOMI. Head: Atraumatic. ENT:      Ears:      Nose: No congestion/rhinnorhea.      Mouth/Throat: Mucous membranes are moist.  Neck: No stridor.   Cardiovascular: Normal rate, regular rhythm.  Good peripheral circulation. Respiratory: Normal respiratory effort without tachypnea or retractions. Lungs CTAB. Good air entry to the bases with no decreased or absent breath sounds. Musculoskeletal: Full range  of motion to all extremities. No gross deformities appreciated. Neurologic:  Normal speech and language. No gross focal neurologic deficits are appreciated.  Skin:  Skin is warm, dry and intact.  1 cm x 1 cm jagged shave abrasion removing epidermis to MCP joint of left hand.  No drainage or malodor.   ____________________________________________   LABS (all labs ordered are listed, but only abnormal results are displayed)  Labs Reviewed - No data to display ____________________________________________  EKG   ____________________________________________  RADIOLOGY Lexine Baton, personally viewed and evaluated these images (plain radiographs) as part of my medical decision making, as well as reviewing the written report by the radiologist.  Dg Finger Thumb Left  Result Date: 05/03/2017 CLINICAL DATA:  Pain following laceration EXAM: LEFT THUMB 2+V COMPARISON:  None. FINDINGS: Frontal, oblique, and lateral views were obtained. No fracture or dislocation. There is slight osteoarthritic change in the first IP joint. Other joint spaces appear normal. No erosive change. No radiopaque foreign body. No appreciable soft tissue air. There is arterial vascular calcification in the wrist region. IMPRESSION: Mild osteoarthritic change first IP joint. No fracture or dislocation. No radiopaque foreign body or soft  tissue air evident. Arterial vascular calcifications/atherosclerosis in the wrist region. Electronically Signed   By: Bretta Bang III M.D.   On: 05/03/2017 14:08    ____________________________________________    PROCEDURES  Procedure(s) performed:    Procedures    Medications - No data to display   ____________________________________________   INITIAL IMPRESSION / ASSESSMENT AND PLAN / ED COURSE  Pertinent labs & imaging results that were available during my care of the patient were reviewed by me and considered in my medical decision making (see chart for details).  Review of the [place] CSRS was performed in accordance of the [facility] prior to dispensing any controlled drugs.   Patient's diagnosis is consistent with thumb laceration.  Vital signs and exam are reassuring.  Laceration is 48 hours old and risks of repair outweigh benefits.  Skin has been removed and there is no area to suture.  Laceration was cleaned and dressed.  Patient will be discharged home with prescriptions for keflex. Patient is to follow up with wound care as directed. Patient is given ED precautions to return to the ED for any worsening or new symptoms.     ____________________________________________  FINAL CLINICAL IMPRESSION(S) / ED DIAGNOSES  Final diagnoses:  Laceration of left hand, foreign body presence unspecified, initial encounter      NEW MEDICATIONS STARTED DURING THIS VISIT:  ED Discharge Orders        Ordered    cephALEXin (KEFLEX) 500 MG capsule  4 times daily     05/03/17 1456          This chart was dictated using voice recognition software/Dragon. Despite best efforts to proofread, errors can occur which can change the meaning. Any change was purely unintentional.    Enid Derry, PA-C 05/03/17 1533    Don Perking, [place], MD 05/05/17 [phone]

## 2017-05-03 NOTE — ED Notes (Signed)
Clean bandage noted to first finger on left hand, no bleeding noted.

## 2017-05-03 NOTE — ED Triage Notes (Signed)
Pt states he cut his left thumb on Saturday with a grinder. States his PCP recommended he come get it checked out,

## 2017-05-03 NOTE — Patient Instructions (Addendum)
Call the office if blood pressure runs low   Medication Instructions:  Your physician has recommended you make the following change in your medication:  1. STOP Losartan 2. CONTINUE Entresto  Medication Samples have been provided to the patient.  Drug name: Sherryll Burger       Strength: 24/26 mg        Qty: 2 boxes  LOT: [id number]  Exp.Date: Jun 2021  Labwork:  No new labs needed  Testing/Procedures:  Recheck echo end of the summer for cardiomyopathy  ABD aorta u/s for AAA rule out , hx of smoking   Follow-Up: It was a pleasure seeing you in the office today. Please call us if you have new issues that need to be addressed before your next appt.  [phone]  Your physician wants you to follow-up in: 6 months.  You will receive a reminder letter in the mail two months in advance. If you don't receive a letter, please call our office to schedule the follow-up appointment.  If you need a refill on your cardiac medications before your next appointment, please call your pharmacy.  For educational health videos Log in to : [email or web address] Or : [email or web address], password : [email or web address]

## 2017-05-03 NOTE — Progress Notes (Signed)
Daily Session Note  Patient Details  Name: Adam Douglas MRN: [id number] Date of Birth: [birth year] Referring Provider:     Cardiac Rehab from 03/22/2017 in [facility] Cardiac and Pulmonary Rehab  Referring Provider  Adam Rogue MD      Encounter Date: 05/03/2017  Check In: Session Check In - 05/03/17 0748      Check-In   Location  [facility]-Cardiac & Pulmonary Rehab    Staff Present  Heath Lark, RN, BSN, CCRP;Elenie Coven Luan Pulling, MA, RCEP, CCRP, Exercise Physiologist;Kelly Amedeo Plenty, BS, ACSM CEP, Exercise Physiologist    Supervising physician immediately available to respond to emergencies  See telemetry face sheet for immediately available ER MD    Medication changes reported      No    Fall or balance concerns reported     No    Warm-up and Cool-down  Performed on first and last piece of equipment    Resistance Training Performed  Yes    VAD Patient?  No      Pain Assessment   Currently in Pain?  No/denies          Social History   Tobacco Use  Smoking Status Former Smoker  . Packs/day: 1.00  . Years: 15.00  . Pack years: 15.00  Smokeless Tobacco Never Used  Tobacco Comment   Quit in his 47's.    Goals Met:  Independence with exercise equipment Exercise tolerated well No report of cardiac concerns or symptoms Strength training completed today  Goals Unmet:  Not Applicable  Comments: Pt able to follow exercise prescription today without complaint.  Will continue to monitor for progression.    Dr. Emily Filbert is Medical Director for [place] and [facility].

## 2017-05-03 NOTE — ED Notes (Signed)
See triage note  States he received laceration to left thumb on Saturday  Was using a grinder

## 2017-05-04 ENCOUNTER — Other Ambulatory Visit: Payer: Self-pay | Admitting: *Deleted

## 2017-05-04 MED ORDER — NITROGLYCERIN 0.4 MG SL SUBL: 0.4000 mg | SUBLINGUAL_TABLET | SUBLINGUAL | 2 refills | Status: DC | PRN

## 2017-05-05 ENCOUNTER — Encounter: Payer: Self-pay | Admitting: Cardiovascular Disease

## 2017-05-05 ENCOUNTER — Encounter: Payer: Medicare HMO | Attending: Cardiovascular Disease

## 2017-05-05 DIAGNOSIS — I2510 Atherosclerotic heart disease of native coronary artery without angina pectoris: Secondary | ICD-10-CM | POA: Insufficient documentation

## 2017-05-05 DIAGNOSIS — I447 Left bundle-branch block, unspecified: Secondary | ICD-10-CM | POA: Insufficient documentation

## 2017-05-05 DIAGNOSIS — Z8673 Personal history of transient ischemic attack (TIA), and cerebral infarction without residual deficits: Secondary | ICD-10-CM | POA: Diagnosis not present

## 2017-05-05 DIAGNOSIS — Z79899 Other long term (current) drug therapy: Secondary | ICD-10-CM | POA: Insufficient documentation

## 2017-05-05 DIAGNOSIS — E785 Hyperlipidemia, unspecified: Secondary | ICD-10-CM | POA: Insufficient documentation

## 2017-05-05 DIAGNOSIS — I214 Non-ST elevation (NSTEMI) myocardial infarction: Secondary | ICD-10-CM | POA: Diagnosis present

## 2017-05-05 DIAGNOSIS — I10 Essential (primary) hypertension: Secondary | ICD-10-CM | POA: Insufficient documentation

## 2017-05-05 DIAGNOSIS — I255 Ischemic cardiomyopathy: Secondary | ICD-10-CM | POA: Diagnosis not present

## 2017-05-05 DIAGNOSIS — E119 Type 2 diabetes mellitus without complications: Secondary | ICD-10-CM | POA: Insufficient documentation

## 2017-05-05 DIAGNOSIS — Z794 Long term (current) use of insulin: Secondary | ICD-10-CM | POA: Diagnosis not present

## 2017-05-05 DIAGNOSIS — Z7982 Long term (current) use of aspirin: Secondary | ICD-10-CM | POA: Insufficient documentation

## 2017-05-05 DIAGNOSIS — Z87891 Personal history of nicotine dependence: Secondary | ICD-10-CM | POA: Diagnosis not present

## 2017-05-05 NOTE — Progress Notes (Signed)
Daily Session Note  Patient Details  Name: Adam Douglas MRN: [id number] Date of Birth: 02-24-1950 Referring Provider:     Cardiac Rehab from 03/22/2017 in [facility] Cardiac and Pulmonary Rehab  Referring Provider  Ida Rogue MD      Encounter Date: 05/05/2017  Check In: Session Check In - 05/05/17 0743      Check-In   Location  [facility]-Cardiac & Pulmonary Rehab    Staff Present  Justin Mend RCP,RRT,BSRT;Heath Lark, RN, BSN, CCRP;Jessica Luan Pulling, MA, RCEP, CCRP, Exercise Physiologist    Supervising physician immediately available to respond to emergencies  See telemetry face sheet for immediately available ER MD    Medication changes reported      No    Fall or balance concerns reported     No    Tobacco Cessation  No Change    Warm-up and Cool-down  Performed on first and last piece of equipment    Resistance Training Performed  Yes    VAD Patient?  No      Pain Assessment   Currently in Pain?  No/denies        Exercise Prescription Changes - 05/04/17 1000      Response to Exercise   Blood Pressure (Admit)  130/70    Blood Pressure (Exercise)  162/56    Blood Pressure (Exit)  122/62    Heart Rate (Admit)  76 bpm    Heart Rate (Exercise)  105 bpm    Heart Rate (Exit)  80 bpm    Rating of Perceived Exertion (Exercise)  12    Symptoms  none    Duration  Continue with 45 min of aerobic exercise without signs/symptoms of physical distress.    Intensity  THRR unchanged      Progression   Progression  Continue to progress workloads to maintain intensity without signs/symptoms of physical distress.    Average METs  3.78      Resistance Training   Training Prescription  Yes    Weight  7 lbs    Reps  10-15      Interval Training   Interval Training  No      Treadmill   MPH  1.8    Grade  0.5    Minutes  15    METs  2.5      Recumbant Bike   Level  1    Watts  55    Minutes  15    METs  3.73      NuStep   Level  4    Minutes  15    METs  5.1      Home  Exercise Plan   Plans to continue exercise at  Home (comment) walking    Frequency  Add 2 additional days to program exercise sessions.    Initial Home Exercises Provided  04/16/17       Social History   Tobacco Use  Smoking Status Former Smoker  . Packs/day: 1.00  . Years: 15.00  . Pack years: 15.00  Smokeless Tobacco Never Used  Tobacco Comment   Quit in his 18's.    Goals Met:  Independence with exercise equipment Exercise tolerated well No report of cardiac concerns or symptoms Strength training completed today  Goals Unmet:  Not Applicable  Comments: Pt able to follow exercise prescription today without complaint.  Will continue to monitor for progression.   Dr. Emily Filbert is Medical Director for [place] and [facility].

## 2017-05-07 ENCOUNTER — Encounter: Payer: Medicare HMO | Admitting: *Deleted

## 2017-05-07 DIAGNOSIS — I214 Non-ST elevation (NSTEMI) myocardial infarction: Secondary | ICD-10-CM

## 2017-05-07 NOTE — Progress Notes (Signed)
Daily Session Note  Patient Details  Name: Adam Douglas MRN: [id number] Date of Birth: November 26, 1950 Referring Provider:     Cardiac Rehab from 03/22/2017 in [facility] Cardiac and Pulmonary Rehab  Referring Provider  Ida Rogue MD      Encounter Date: 05/07/2017  Check In: Session Check In - 05/07/17 0959      Check-In   Location  [facility]-Cardiac & Pulmonary Rehab    Staff Present  Renita Papa, RN BSN;Jessica Luan Pulling, MA, RCEP, CCRP, Exercise Physiologist;Amanda Oletta Darter, [place], ACSM CEP, Exercise Physiologist    Supervising physician immediately available to respond to emergencies  See telemetry face sheet for immediately available ER MD    Medication changes reported      No    Fall or balance concerns reported     No    Warm-up and Cool-down  Performed on first and last piece of equipment    Resistance Training Performed  Yes    VAD Patient?  No      Pain Assessment   Currently in Pain?  No/denies          Social History   Tobacco Use  Smoking Status Former Smoker  . Packs/day: 1.00  . Years: 15.00  . Pack years: 15.00  Smokeless Tobacco Never Used  Tobacco Comment   Quit in his 25's.    Goals Met:  Independence with exercise equipment Exercise tolerated well Personal goals reviewed No report of cardiac concerns or symptoms Strength training completed today  Goals Unmet:  Not Applicable  Comments: Pt able to follow exercise prescription today without complaint.  Will continue to monitor for progression. [place] Name 03/22/17 1404 05/07/17 0959       6 Minute Walk   Phase  Initial  Discharge    Distance  1085 feet  1468 feet    Distance % Change  -  35.3 %    Distance Feet Change  -  383 ft    Walk Time  6 minutes  6 minutes    # of Rest Breaks  0  0    MPH  2.06  2.78    METS  2.41  3.12    RPE  13  12    VO2 Peak  8.45  10.91    Symptoms  No  No    Resting HR  5 bpm  78 bpm    Resting BP  126/72  114/58    Resting Oxygen Saturation    97 %  -    Exercise Oxygen Saturation  during 6 min walk  98 %  -    Max Ex. HR  88 bpm  95 bpm    Max Ex. BP  152/64  132/64    2 Minute Post BP  134/64  -         Dr. Emily Filbert is Medical Director for [place] and [facility].

## 2017-05-10 ENCOUNTER — Encounter: Payer: Medicare HMO | Admitting: *Deleted

## 2017-05-10 DIAGNOSIS — I214 Non-ST elevation (NSTEMI) myocardial infarction: Secondary | ICD-10-CM

## 2017-05-10 NOTE — Progress Notes (Signed)
Daily Session Note  Patient Details  Name: Adam Douglas MRN: [id number] Date of Birth: Apr 21, 1950 Referring Provider:     Cardiac Rehab from 03/22/2017 in [facility] Cardiac and Pulmonary Rehab  Referring Provider  Ida Rogue MD      Encounter Date: 05/10/2017  Check In: Session Check In - 05/10/17 0802      Check-In   Location  [facility]-Cardiac & Pulmonary Rehab    Staff Present  Heath Lark, RN, BSN, CCRP;Dejane Scheibe Luan Pulling, MA, RCEP, CCRP, Exercise Physiologist;Kelly Amedeo Plenty, BS, ACSM CEP, Exercise Physiologist    Supervising physician immediately available to respond to emergencies  See telemetry face sheet for immediately available ER MD    Medication changes reported      No    Fall or balance concerns reported     No    Warm-up and Cool-down  Performed on first and last piece of equipment    Resistance Training Performed  Yes    VAD Patient?  No      Pain Assessment   Currently in Pain?  No/denies          Social History   Tobacco Use  Smoking Status Former Smoker  . Packs/day: 1.00  . Years: 15.00  . Pack years: 15.00  Smokeless Tobacco Never Used  Tobacco Comment   Quit in his 58's.    Goals Met:  Independence with exercise equipment Exercise tolerated well No report of cardiac concerns or symptoms Strength training completed today  Goals Unmet:  Not Applicable  Comments: Pt able to follow exercise prescription today without complaint.  Will continue to monitor for progression.    Dr. Emily Filbert is Medical Director for [place] and [facility].

## 2017-05-12 DIAGNOSIS — I214 Non-ST elevation (NSTEMI) myocardial infarction: Secondary | ICD-10-CM

## 2017-05-12 NOTE — Progress Notes (Signed)
Daily Session Note  Patient Details  Name: Adam Douglas MRN: [id number] Date of Birth: Oct 29, 1950 Referring Provider:     Cardiac Rehab from 03/22/2017 in [facility] Cardiac and Pulmonary Rehab  Referring Provider  Ida Rogue MD      Encounter Date: 05/12/2017  Check In: Session Check In - 05/12/17 0803      Check-In   Location  [facility]-Cardiac & Pulmonary Rehab    Staff Present  Alberteen Sam, MA, RCEP, CCRP, Exercise Physiologist;Korby Ratay [place];Heath Lark, RN, BSN, CCRP    Supervising physician immediately available to respond to emergencies  See telemetry face sheet for immediately available ER MD    Medication changes reported      No    Fall or balance concerns reported     No    Tobacco Cessation  No Change    Warm-up and Cool-down  Performed on first and last piece of equipment    Resistance Training Performed  Yes    VAD Patient?  No      Pain Assessment   Currently in Pain?  No/denies          Social History   Tobacco Use  Smoking Status Former Smoker  . Packs/day: 1.00  . Years: 15.00  . Pack years: 15.00  Smokeless Tobacco Never Used  Tobacco Comment   Quit in his 89's.    Goals Met:  Independence with exercise equipment Exercise tolerated well No report of cardiac concerns or symptoms Strength training completed today  Goals Unmet:  Not Applicable  Comments: Pt able to follow exercise prescription today without complaint.  Will continue to monitor for progression.   Dr. Emily Filbert is Medical Director for [place] and [facility].

## 2017-05-12 NOTE — Patient Instructions (Signed)
Discharge Patient Instructions  Patient Details  Name: Adam Douglas MRN: [id number] Date of Birth: 11/20/1950 Referring Provider:  Minna Merritts, MD   Number of Visits: 35/36  Reason for Discharge:  Patient reached a stable level of exercise. Patient independent in their exercise. Patient has met program and personal goals.  Smoking History:  Social History   Tobacco Use  Smoking Status Former Smoker  . Packs/day: 1.00  . Years: 15.00  . Pack years: 15.00  Smokeless Tobacco Never Used  Tobacco Comment   Quit in his 80's.    Diagnosis:  NSTEMI (non-ST elevated myocardial infarction) [facility])  Initial Exercise Prescription: Initial Exercise Prescription - 03/22/17 1400      Date of Initial Exercise RX and Referring Provider   Date  03/22/17    Referring Provider  Ida Rogue MD      Treadmill   MPH  1.8    Grade  0.5    Minutes  15    METs  2.5      Recumbant Bike   Level  1    RPM  50    Watts  14    Minutes  15    METs  2      NuStep   Level  1    SPM  80    Minutes  15    METs  2      Prescription Details   Frequency (times per week)  3    Duration  Progress to 45 minutes of aerobic exercise without signs/symptoms of physical distress      Intensity   THRR 40-80% of Max Heartrate  100-135    Ratings of Perceived Exertion  11-13    Perceived Dyspnea  0-4      Progression   Progression  Continue to progress workloads to maintain intensity without signs/symptoms of physical distress.      Resistance Training   Training Prescription  Yes    Weight  3 lbs    Reps  10-15       Discharge Exercise Prescription (Final Exercise Prescription Changes): Exercise Prescription Changes - 05/04/17 1000      Response to Exercise   Blood Pressure (Admit)  130/70    Blood Pressure (Exercise)  162/56    Blood Pressure (Exit)  122/62    Heart Rate (Admit)  76 bpm    Heart Rate (Exercise)  105 bpm    Heart Rate (Exit)  80 bpm    Rating of  Perceived Exertion (Exercise)  12    Symptoms  none    Duration  Continue with 45 min of aerobic exercise without signs/symptoms of physical distress.    Intensity  THRR unchanged      Progression   Progression  Continue to progress workloads to maintain intensity without signs/symptoms of physical distress.    Average METs  3.78      Resistance Training   Training Prescription  Yes    Weight  7 lbs    Reps  10-15      Interval Training   Interval Training  No      Treadmill   MPH  1.8    Grade  0.5    Minutes  15    METs  2.5      Recumbant Bike   Level  1    Watts  55    Minutes  15    METs  3.73      NuStep  Level  4    Minutes  15    METs  5.1      Home Exercise Plan   Plans to continue exercise at  Home (comment) walking    Frequency  Add 2 additional days to program exercise sessions.    Initial Home Exercises Provided  04/16/17       Functional Capacity: 6 Minute Walk    Row Name 03/22/17 1404 05/07/17 0959       6 Minute Walk   Phase  Initial  Discharge    Distance  1085 feet  1468 feet    Distance % Change  -  35.3 %    Distance Feet Change  -  383 ft    Walk Time  6 minutes  6 minutes    # of Rest Breaks  0  0    MPH  2.06  2.78    METS  2.41  3.12    RPE  13  12    VO2 Peak  8.45  10.91    Symptoms  No  No    Resting HR  5 bpm  78 bpm    Resting BP  126/72  114/58    Resting Oxygen Saturation   97 %  -    Exercise Oxygen Saturation  during 6 min walk  98 %  -    Max Ex. HR  88 bpm  95 bpm    Max Ex. BP  152/64  132/64    2 Minute Post BP  134/64  -       Quality of Life: Quality of Life - 05/10/17 0821      Quality of Life Scores   Health/Function Pre  22.07 %    Health/Function Post  26.77 %    Health/Function % Change  21.3 %    Socioeconomic Pre  25 %    Socioeconomic Post  25.43 %    Socioeconomic % Change   1.72 %    Psych/Spiritual Pre  30 %    Psych/Spiritual Post  27.21 %    Psych/Spiritual % Change  -9.3 %    Family  Pre  26 %    Family Post  27.6 %    Family % Change  6.15 %    GLOBAL Pre  24.97 %    GLOBAL Post  26.71 %    GLOBAL % Change  6.97 %       Personal Goals: Goals established at orientation with interventions provided to work toward goal. Personal Goals and Risk Factors at Admission - 03/22/17 1325      Core Components/Risk Factors/Patient Goals on Admission    Weight Management  Yes;Weight Loss    Intervention  Weight Management: Develop a combined nutrition and exercise program designed to reach desired caloric intake, while maintaining appropriate intake of nutrient and fiber, sodium and fats, and appropriate energy expenditure required for the weight goal.;Weight Management: Provide education and appropriate resources to help participant work on and attain dietary goals.;Weight Management/Obesity: Establish reasonable short term and long term weight goals.    Admit Weight  226 lb (102.5 kg)    Goal Weight: Short Term  222 lb (100.7 kg)    Goal Weight: Long Term  199 lb (90.3 kg)    Expected Outcomes  Short Term: Continue to assess and modify interventions until short term weight is achieved;Long Term: Adherence to nutrition and physical activity/exercise program aimed toward attainment of established weight goal;Weight  Loss: Understanding of general recommendations for a balanced deficit meal plan, which promotes 1-2 lb weight loss per week and includes a negative energy balance of [phone] kcal/d;Understanding recommendations for meals to include 15-35% energy as protein, 25-35% energy from fat, 35-60% energy from carbohydrates, less than 247m of dietary cholesterol, 20-35 gm of total fiber daily;Understanding of distribution of calorie intake throughout the day with the consumption of 4-5 meals/snacks    Diabetes  Yes    Intervention  Provide education about signs/symptoms and action to take for hypo/hyperglycemia.;Provide education about proper nutrition, including hydration, and  aerobic/resistive exercise prescription along with prescribed medications to achieve blood glucose in normal ranges: Fasting glucose 65-99 mg/dL    Expected Outcomes  Short Term: Participant verbalizes understanding of the signs/symptoms and immediate care of hyper/hypoglycemia, proper foot care and importance of medication, aerobic/resistive exercise and nutrition plan for blood glucose control.;Long Term: Attainment of HbA1C < 7%.    Heart Failure  Yes    Intervention  Provide a combined exercise and nutrition program that is supplemented with education, support and counseling about heart failure. Directed toward relieving symptoms such as shortness of breath, decreased exercise tolerance, and extremity edema.    Expected Outcomes  Improve functional capacity of life    Hypertension  Yes    Intervention  Provide education on lifestyle modifcations including regular physical activity/exercise, weight management, moderate sodium restriction and increased consumption of fresh fruit, vegetables, and low fat dairy, alcohol moderation, and smoking cessation.;Monitor prescription use compliance.    Expected Outcomes  Short Term: Continued assessment and intervention until BP is < 140/932mHG in hypertensive participants. < 130/8010mG in hypertensive participants with diabetes, heart failure or chronic kidney disease.;Long Term: Maintenance of blood pressure at goal levels.    Lipids  Yes    Intervention  Provide education and support for participant on nutrition & aerobic/resistive exercise along with prescribed medications to achieve LDL <57m54mDL >40mg89m Expected Outcomes  Short Term: Participant states understanding of desired cholesterol values and is compliant with medications prescribed. Participant is following exercise prescription and nutrition guidelines.;Long Term: Cholesterol controlled with medications as prescribed, with individualized exercise RX and with personalized nutrition plan. Value  goals: LDL < 57mg,62m > 40 mg.    Stress  Yes DelmasCandidoot been able to participate in yard work or perform househole activities like he used to due to his 3 strokes and other health issues    Intervention  Offer individual and/or small group education and counseling on adjustment to heart disease, stress management and health-related lifestyle change. Teach and support self-help strategies.;Refer participants experiencing significant psychosocial distress to appropriate mental health specialists for further evaluation and treatment. When possible, include family members and significant others in education/counseling sessions.    Expected Outcomes  Short Term: Participant demonstrates changes in health-related behavior, relaxation and other stress management skills, ability to obtain effective social support, and compliance with psychotropic medications if prescribed.;Long Term: Emotional wellbeing is indicated by absence of clinically significant psychosocial distress or social isolation.        Personal Goals Discharge: Goals and Risk Factor Review - 05/07/17 1003      Core Components/Risk Factors/Patient Goals Review   Personal Goals Review  Weight Management/Obesity;Hypertension;Lipids;Diabetes    Review  DelmasMeghanbe graduating soon!!  He improved his walk test today by 35%!  He is going to go to the gym to continue to work on his weight loss.  His wife  continues to help him with his diet.  He has continued to check his blood pressures and blood sugars.  He has actually stopped taking his diabetic medications and is due for a follow up and blood work soon.      Expected Outcomes  Short: Continue to watch sugars.  Long: Continue to work on risk factor modifications.        Exercise Goals and Review: Exercise Goals    Row Name 03/22/17 1408             Exercise Goals   Increase Physical Activity  Yes       Intervention  Provide advice, education, support and counseling about  physical activity/exercise needs.;Develop an individualized exercise prescription for aerobic and resistive training based on initial evaluation findings, risk stratification, comorbidities and participant's personal goals.       Expected Outcomes  Short Term: Attend rehab on a regular basis to increase amount of physical activity.;Long Term: Add in home exercise to make exercise part of routine and to increase amount of physical activity.;Long Term: Exercising regularly at least 3-5 days a week.       Increase Strength and Stamina  Yes       Intervention  Provide advice, education, support and counseling about physical activity/exercise needs.;Develop an individualized exercise prescription for aerobic and resistive training based on initial evaluation findings, risk stratification, comorbidities and participant's personal goals.       Expected Outcomes  Short Term: Increase workloads from initial exercise prescription for resistance, speed, and METs.;Short Term: Perform resistance training exercises routinely during rehab and add in resistance training at home;Long Term: Improve cardiorespiratory fitness, muscular endurance and strength as measured by increased METs and functional capacity (6MWT)       Able to understand and use rate of perceived exertion (RPE) scale  Yes       Intervention  Provide education and explanation on how to use RPE scale       Expected Outcomes  Short Term: Able to use RPE daily in rehab to express subjective intensity level;Long Term:  Able to use RPE to guide intensity level when exercising independently       Knowledge and understanding of Target Heart Rate Range (THRR)  Yes       Intervention  Provide education and explanation of THRR including how the numbers were predicted and where they are located for reference       Expected Outcomes  Short Term: Able to state/look up THRR;Long Term: Able to use THRR to govern intensity when exercising independently;Short Term: Able  to use daily as guideline for intensity in rehab       Able to check pulse independently  Yes       Intervention  Provide education and demonstration on how to check pulse in carotid and radial arteries.;Review the importance of being able to check your own pulse for safety during independent exercise       Expected Outcomes  Short Term: Able to explain why pulse checking is important during independent exercise;Long Term: Able to check pulse independently and accurately       Understanding of Exercise Prescription  Yes       Intervention  Provide education, explanation, and written materials on patient's individual exercise prescription       Expected Outcomes  Short Term: Able to explain program exercise prescription;Long Term: Able to explain home exercise prescription to exercise independently  Nutrition & Weight - Outcomes: Pre Biometrics - 03/22/17 1408      Pre Biometrics   Height  5' 9.75" (1.772 m)    Weight  227 lb 6.4 oz (103.1 kg)    Waist Circumference  43.5 inches    Hip Circumference  40.25 inches    Waist to Hip Ratio  1.08 %    BMI (Calculated)  32.85    Single Leg Stand  1.1 seconds        Nutrition: Nutrition Therapy & Goals - 04/08/17 1515      Nutrition Therapy   Diet  Instructed patient (accompanied by his wife) on a meal plan based on heart healthy dietary guidelines and including diabetes dietary guidelines.    Drug/Food Interactions  Statins/Certain Fruits    Protein (specify units)  8    Fiber  30 grams    Whole Grain Foods  3 servings    Saturated Fats  14 max. grams    Fruits and Vegetables  5 servings/day    Sodium  1500 grams      Personal Nutrition Goals   Nutrition Goal  Read food labels for carbohydrate, sodium and saturated fat.    Personal Goal #2  Space meals 4-5 hours apart. Keep carbohydrate in 30-60 gram range at meals.    Personal Goal #3  If dinner is 6:00 or earlier, include a bedtime snack consisting of 1-2 oz protein and 1  serving of carbohydrate.      Intervention Plan   Intervention  Prescribe, educate and counsel regarding individualized specific dietary modifications aiming towards targeted core components such as weight, hypertension, lipid management, diabetes, heart failure and other comorbidities.;Nutrition handout(s) given to patient.    Expected Outcomes  Short Term Goal: Understand basic principles of dietary content, such as calories, fat, sodium, cholesterol and nutrients.;Short Term Goal: A plan has been developed with personal nutrition goals set during dietitian appointment.;Long Term Goal: Adherence to prescribed nutrition plan.       Nutrition Discharge: Nutrition Assessments - 05/10/17 0821      MEDFICTS Scores   Pre Score  6    Post Score  27    Score Difference  21       Education Questionnaire Score: Knowledge Questionnaire Score - 05/10/17 5320      Knowledge Questionnaire Score   Pre Score  21/28    Post Score  26/28       Goals reviewed with patient; copy given to patient.

## 2017-05-14 ENCOUNTER — Encounter: Payer: Medicare HMO | Admitting: *Deleted

## 2017-05-14 DIAGNOSIS — I214 Non-ST elevation (NSTEMI) myocardial infarction: Secondary | ICD-10-CM

## 2017-05-14 NOTE — Progress Notes (Signed)
Discharge Progress Report  Patient Details  Name: Adam Douglas MRN: [id number] Date of Birth: September 08, 1950 Referring Provider:     Cardiac Rehab from 03/22/2017 in [facility]  Referring Provider  Ida Rogue MD       Number of Visits: 34/36  Reason for Discharge:  Patient reached a stable level of exercise. Patient independent in their exercise. Patient has met program and personal goals.  Smoking History:  Social History   Tobacco Use  Smoking Status Former Smoker  . Packs/day: 1.00  . Years: 15.00  . Pack years: 15.00  Smokeless Tobacco Never Used  Tobacco Comment   Quit in his 74's.    Diagnosis:  NSTEMI (non-ST elevated myocardial infarction) ([facility])  ADL [facility]:   Initial Exercise Prescription: Initial Exercise Prescription - 03/22/17 1400      Date of Initial Exercise RX and Referring Provider   Date  03/22/17    Referring Provider  Ida Rogue MD      Treadmill   MPH  1.8    Grade  0.5    Minutes  15    METs  2.5      Recumbant Bike   Level  1    RPM  50    Watts  14    Minutes  15    METs  2      NuStep   Level  1    SPM  80    Minutes  15    METs  2      Prescription Details   Frequency (times per week)  3    Duration  Progress to 45 minutes of aerobic exercise without signs/symptoms of physical distress      Intensity   THRR 40-80% of Max Heartrate  100-135    Ratings of Perceived Exertion  11-13    Perceived Dyspnea  0-4      Progression   Progression  Continue to progress workloads to maintain intensity without signs/symptoms of physical distress.      Resistance Training   Training Prescription  Yes    Weight  3 lbs    Reps  10-15       Discharge Exercise Prescription (Final Exercise Prescription Changes): Exercise Prescription Changes - 05/04/17 1000      Response to Exercise   Blood Pressure (Admit)  130/70    Blood Pressure (Exercise)  162/56    Blood Pressure (Exit)  122/62    Heart Rate  (Admit)  76 bpm    Heart Rate (Exercise)  105 bpm    Heart Rate (Exit)  80 bpm    Rating of Perceived Exertion (Exercise)  12    Symptoms  none    Duration  Continue with 45 min of aerobic exercise without signs/symptoms of physical distress.    Intensity  THRR unchanged      Progression   Progression  Continue to progress workloads to maintain intensity without signs/symptoms of physical distress.    Average METs  3.78      Resistance Training   Training Prescription  Yes    Weight  7 lbs    Reps  10-15      Interval Training   Interval Training  No      Treadmill   MPH  1.8    Grade  0.5    Minutes  15    METs  2.5      Recumbant Bike   Level  1  Watts  55    Minutes  15    METs  3.73      NuStep   Level  4    Minutes  15    METs  5.1      Home Exercise Plan   Plans to continue exercise at  Home (comment) walking    Frequency  Add 2 additional days to program exercise sessions.    Initial Home Exercises Provided  04/16/17       Functional Capacity: 6 Minute Walk    Row Name 03/22/17 1404 05/07/17 0959       6 Minute Walk   Phase  Initial  Discharge    Distance  1085 feet  1468 feet    Distance % Change  -  35.3 %    Distance Feet Change  -  383 ft    Walk Time  6 minutes  6 minutes    # of Rest Breaks  0  0    MPH  2.06  2.78    METS  2.41  3.12    RPE  13  12    VO2 Peak  8.45  10.91    Symptoms  No  No    Resting HR  5 bpm  78 bpm    Resting BP  126/72  114/58    Resting Oxygen Saturation   97 %  -    Exercise Oxygen Saturation  during 6 min walk  98 %  -    Max Ex. HR  88 bpm  95 bpm    Max Ex. BP  152/64  132/64    2 Minute Post BP  134/64  -       Psychological, QOL, Others - Outcomes: PHQ 2/9: Depression screen [facility] 2/9 05/10/2017 03/22/2017  Decreased Interest 0 0  Down, Depressed, Hopeless 0 0  PHQ - 2 Score 0 0  Altered sleeping 0 0  Tired, decreased energy 0 1  Change in appetite 0 0  Feeling bad or failure about yourself  0 0   Trouble concentrating 0 1  Moving slowly or fidgety/restless 0 1  Suicidal thoughts 0 0  PHQ-9 Score 0 3  Difficult doing work/chores Not difficult at all Somewhat difficult    Quality of Life: Quality of Life - 05/10/17 0821      Quality of Life Scores   Health/Function Pre  22.07 %    Health/Function Post  26.77 %    Health/Function % Change  21.3 %    Socioeconomic Pre  25 %    Socioeconomic Post  25.43 %    Socioeconomic % Change   1.72 %    Psych/Spiritual Pre  30 %    Psych/Spiritual Post  27.21 %    Psych/Spiritual % Change  -9.3 %    Family Pre  26 %    Family Post  27.6 %    Family % Change  6.15 %    GLOBAL Pre  24.97 %    GLOBAL Post  26.71 %    GLOBAL % Change  6.97 %       Personal Goals: Goals established at orientation with interventions provided to work toward goal. Personal Goals and Risk Factors at Admission - 03/22/17 1325      Core Components/Risk Factors/Patient Goals on Admission    Weight Management  Yes;Weight Loss    Intervention  Weight Management: Develop a combined nutrition and exercise program designed to reach desired  caloric intake, while maintaining appropriate intake of nutrient and fiber, sodium and fats, and appropriate energy expenditure required for the weight goal.;Weight Management: Provide education and appropriate resources to help participant work on and attain dietary goals.;Weight Management/Obesity: Establish reasonable short term and long term weight goals.    Admit Weight  226 lb (102.5 kg)    Goal Weight: Short Term  222 lb (100.7 kg)    Goal Weight: Long Term  199 lb (90.3 kg)    Expected Outcomes  Short Term: Continue to assess and modify interventions until short term weight is achieved;Long Term: Adherence to nutrition and physical activity/exercise program aimed toward attainment of established weight goal;Weight Loss: Understanding of general recommendations for a balanced deficit meal plan, which promotes 1-2 lb weight  loss per week and includes a negative energy balance of [phone] kcal/d;Understanding recommendations for meals to include 15-35% energy as protein, 25-35% energy from fat, 35-60% energy from carbohydrates, less than 259m of dietary cholesterol, 20-35 gm of total fiber daily;Understanding of distribution of calorie intake throughout the day with the consumption of 4-5 meals/snacks    Diabetes  Yes    Intervention  Provide education about signs/symptoms and action to take for hypo/hyperglycemia.;Provide education about proper nutrition, including hydration, and aerobic/resistive exercise prescription along with prescribed medications to achieve blood glucose in normal ranges: Fasting glucose 65-99 mg/dL    Expected Outcomes  Short Term: Participant verbalizes understanding of the signs/symptoms and immediate care of hyper/hypoglycemia, proper foot care and importance of medication, aerobic/resistive exercise and nutrition plan for blood glucose control.;Long Term: Attainment of HbA1C < 7%.    Heart Failure  Yes    Intervention  Provide a combined exercise and nutrition program that is supplemented with education, support and counseling about heart failure. Directed toward relieving symptoms such as shortness of breath, decreased exercise tolerance, and extremity edema.    Expected Outcomes  Improve functional capacity of life    Hypertension  Yes    Intervention  Provide education on lifestyle modifcations including regular physical activity/exercise, weight management, moderate sodium restriction and increased consumption of fresh fruit, vegetables, and low fat dairy, alcohol moderation, and smoking cessation.;Monitor prescription use compliance.    Expected Outcomes  Short Term: Continued assessment and intervention until BP is < 140/975mHG in hypertensive participants. < 130/8092mG in hypertensive participants with diabetes, heart failure or chronic kidney disease.;Long Term: Maintenance of blood  pressure at goal levels.    Lipids  Yes    Intervention  Provide education and support for participant on nutrition & aerobic/resistive exercise along with prescribed medications to achieve LDL <71m46mDL >40mg49m Expected Outcomes  Short Term: Participant states understanding of desired cholesterol values and is compliant with medications prescribed. Participant is following exercise prescription and nutrition guidelines.;Long Term: Cholesterol controlled with medications as prescribed, with individualized exercise RX and with personalized nutrition plan. Value goals: LDL < 71mg,57m > 40 mg.    Stress  Yes DelmasIsidorot been able to participate in yard work or perform househole activities like he used to due to his 3 strokes and other health issues    Intervention  Offer individual and/or small group education and counseling on adjustment to heart disease, stress management and health-related lifestyle change. Teach and support self-help strategies.;Refer participants experiencing significant psychosocial distress to appropriate mental health specialists for further evaluation and treatment. When possible, include family members and significant others in education/counseling sessions.    Expected Outcomes  Short  Term: Participant demonstrates changes in health-related behavior, relaxation and other stress management skills, ability to obtain effective social support, and compliance with psychotropic medications if prescribed.;Long Term: Emotional wellbeing is indicated by absence of clinically significant psychosocial distress or social isolation.        Personal Goals Discharge: Goals and Risk Factor Review    Row Name 04/16/17 0811 05/07/17 1003           Core Components/Risk Factors/Patient Goals Review   Personal Goals Review  Weight Management/Obesity;Hypertension;Lipids;Diabetes  Weight Management/Obesity;Hypertension;Lipids;Diabetes      Review  Sabas is doing good in rehab.  He is  losing weight and has already met his short term goal as he is down to 220lbs.  He has been really watching his diet and will now add in more exercise at home to help some more.  Blood sugars have been running on the lower side and he checks them twice a day.  He was 74 this morning!!  Blood presures have good too and he has been checking them twice a day.  He is doing well on his medications.  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  He improved his walk test today by 35%!  He is going to go to the gym to continue to work on his weight loss.  His wife continues to help him with his diet.  He has continued to check his blood pressures and blood sugars.  He has actually stopped taking his diabetic medications and is due for a follow up and blood work soon.        Expected Outcomes  Short: Keep watching blood sugars.  Long: Continue to work on weight loss.   Short: Continue to watch sugars.  Long: Continue to work on risk factor modifications.          Exercise Goals and Review: Exercise Goals    Row Name 03/22/17 1408             Exercise Goals   Increase Physical Activity  Yes       Intervention  Provide advice, education, support and counseling about physical activity/exercise needs.;Develop an individualized exercise prescription for aerobic and resistive training based on initial evaluation findings, risk stratification, comorbidities and participant's personal goals.       Expected Outcomes  Short Term: Attend rehab on a regular basis to increase amount of physical activity.;Long Term: Add in home exercise to make exercise part of routine and to increase amount of physical activity.;Long Term: Exercising regularly at least 3-5 days a week.       Increase Strength and Stamina  Yes       Intervention  Provide advice, education, support and counseling about physical activity/exercise needs.;Develop an individualized exercise prescription for aerobic and resistive training based on initial evaluation  findings, risk stratification, comorbidities and participant's personal goals.       Expected Outcomes  Short Term: Increase workloads from initial exercise prescription for resistance, speed, and METs.;Short Term: Perform resistance training exercises routinely during rehab and add in resistance training at home;Long Term: Improve cardiorespiratory fitness, muscular endurance and strength as measured by increased METs and functional capacity (6MWT)       Able to understand and use rate of perceived exertion (RPE) scale  Yes       Intervention  Provide education and explanation on how to use RPE scale       Expected Outcomes  Short Term: Able to use RPE daily in rehab to express subjective  intensity level;Long Term:  Able to use RPE to guide intensity level when exercising independently       Knowledge and understanding of Target Heart Rate Range (THRR)  Yes       Intervention  Provide education and explanation of THRR including how the numbers were predicted and where they are located for reference       Expected Outcomes  Short Term: Able to state/look up THRR;Long Term: Able to use THRR to govern intensity when exercising independently;Short Term: Able to use daily as guideline for intensity in rehab       Able to check pulse independently  Yes       Intervention  Provide education and demonstration on how to check pulse in carotid and radial arteries.;Review the importance of being able to check your own pulse for safety during independent exercise       Expected Outcomes  Short Term: Able to explain why pulse checking is important during independent exercise;Long Term: Able to check pulse independently and accurately       Understanding of Exercise Prescription  Yes       Intervention  Provide education, explanation, and written materials on patient's individual exercise prescription       Expected Outcomes  Short Term: Able to explain program exercise prescription;Long Term: Able to explain home  exercise prescription to exercise independently          Nutrition & Weight - Outcomes: Pre Biometrics - 03/22/17 1408      Pre Biometrics   Height  5' 9.75" (1.772 m)    Weight  227 lb 6.4 oz (103.1 kg)    Waist Circumference  43.5 inches    Hip Circumference  40.25 inches    Waist to Hip Ratio  1.08 %    BMI (Calculated)  32.85    Single Leg Stand  1.1 seconds        Nutrition: Nutrition Therapy & Goals - 04/08/17 1515      Nutrition Therapy   Diet  Instructed patient (accompanied by his wife) on a meal plan based on heart healthy dietary guidelines and including diabetes dietary guidelines.    Drug/Food Interactions  Statins/Certain Fruits    Protein (specify units)  8    Fiber  30 grams    Whole Grain Foods  3 servings    Saturated Fats  14 max. grams    Fruits and Vegetables  5 servings/day    Sodium  1500 grams      Personal Nutrition Goals   Nutrition Goal  Read food labels for carbohydrate, sodium and saturated fat.    Personal Goal #2  Space meals 4-5 hours apart. Keep carbohydrate in 30-60 gram range at meals.    Personal Goal #3  If dinner is 6:00 or earlier, include a bedtime snack consisting of 1-2 oz protein and 1 serving of carbohydrate.      Intervention Plan   Intervention  Prescribe, educate and counsel regarding individualized specific dietary modifications aiming towards targeted core components such as weight, hypertension, lipid management, diabetes, heart failure and other comorbidities.;Nutrition handout(s) given to patient.    Expected Outcomes  Short Term Goal: Understand basic principles of dietary content, such as calories, fat, sodium, cholesterol and nutrients.;Short Term Goal: A plan has been developed with personal nutrition goals set during dietitian appointment.;Long Term Goal: Adherence to prescribed nutrition plan.       Nutrition Discharge: Nutrition Assessments - 05/10/17 3267  MEDFICTS Scores   Pre Score  6    Post Score  27     Score Difference  21       Education Questionnaire Score: Knowledge Questionnaire Score - 05/10/17 0821      Knowledge Questionnaire Score   Pre Score  21/28    Post Score  26/28       Goals reviewed with patient; copy given to patient.

## 2017-05-14 NOTE — Progress Notes (Signed)
Cardiac Individual Treatment Plan  Patient Details  Name: Keynan Heffern MRN: [id number] Date of Birth: 1950-02-10 Referring Provider:     Cardiac Rehab from 03/22/2017 in [facility] Cardiac and Pulmonary Rehab  Referring Provider  Ida Rogue MD      Initial Encounter Date:    Cardiac Rehab from 03/22/2017 in [facility] Cardiac and Pulmonary Rehab  Date  03/22/17  Referring Provider  Ida Rogue MD      Visit Diagnosis: NSTEMI (non-ST elevated myocardial infarction) [facility])  Patient's Home Medications on Admission:  Current Outpatient Medications:  .  aspirin EC 81 MG tablet, Take 1 tablet (81 mg total) by mouth daily., Disp: 90 tablet, Rfl: 3 .  atorvastatin (LIPITOR) 40 MG tablet, Take 80 mg by mouth daily., Disp: , Rfl:  .  carvedilol (COREG) 6.25 MG tablet, Take 1 tablet (6.25 mg total) by mouth 2 (two) times daily with a meal., Disp: 180 tablet, Rfl: 2 .  citalopram (CELEXA) 40 MG tablet, Take 40 mg by mouth daily., Disp: , Rfl:  .  clopidogrel (PLAVIX) 75 MG tablet, Take 1 tablet (75 mg total) by mouth daily with breakfast., Disp: 90 tablet, Rfl: 2 .  furosemide (LASIX) 20 MG tablet, Take 1 tablet (20 mg total) by mouth daily., Disp: 90 tablet, Rfl: 2 .  insulin aspart (NOVOLOG) 100 UNIT/ML injection, Inject 15 Units into the skin 3 (three) times daily with meals., Disp: , Rfl:  .  insulin NPH-regular Human (NOVOLIN 70/30) (70-30) 100 UNIT/ML injection, Inject 20-50 Units into the skin 2 (two) times daily with a meal. 30 units in the morning and 20 at night, Disp: 10 mL, Rfl: 11 .  isosorbide mononitrate (IMDUR) 30 MG 24 hr tablet, Take 0.5 tablets (15 mg total) by mouth daily., Disp: 45 tablet, Rfl: 2 .  metFORMIN (GLUCOPHAGE-XR) 500 MG 24 hr tablet, Take 500 mg by mouth 2 (two) times daily., Disp: , Rfl:  .  nitroGLYCERIN (NITROSTAT) 0.4 MG SL tablet, Place 1 tablet (0.4 mg total) under the tongue every 5 (five) minutes as needed., Disp: 25 tablet, Rfl: 2 .  omeprazole (PRILOSEC) 20  MG capsule, Take 20 mg by mouth 2 (two) times daily before a meal., Disp: , Rfl:  .  Polyethyl Glycol-Propyl Glycol (SYSTANE) 0.4-0.3 % SOLN, Apply 1 drop to eye daily., Disp: , Rfl:  .  sacubitril-valsartan (ENTRESTO) 24-26 MG, Take 1 tablet by mouth 2 (two) times daily., Disp: 180 tablet, Rfl: 3 .  traMADol (ULTRAM) 50 MG tablet, Take 50 mg by mouth every 8 (eight) hours as needed for pain., Disp: , Rfl:   Past Medical History: Past Medical History:  Diagnosis Date  . CAD (coronary artery disease)    a. 02/2017 NSTEMI/Cath: LM 40d, LAD 3m 734m, D2 small w/ sev prox/m dzs, LCX nl, OM1 sev diff dzs - small vessel, OM2 80-90p/m, RCA dominant 3064mPDA 50, EF <20%-->Med Rx.  . Diabetes [place] without complication [place] . HFrEF (heart failure with reduced ejection fraction) ([place]  a. 02/2017 Echo: EF 20-25%.  . HMarland Kitchenperlipidemia   . Hypertension   . Ischemic cardiomyopathy    a. 02/2017 Echo: EF 20-25%, diff HK, Gr2 DD, mild MR.  . LBBB (left bundle branch block)    a. Noted 02/2017. Pt denies any known prior history of LBBB (not present on 2014 [facility] ECG interpretation).  . [facility]bid [facility] . [facility]  a. [place], [facility] for last one.    Tobacco Use: Social History   Tobacco Use  Smoking Status Former Smoker  . Packs/day: 1.00  . Years: 15.00  . Pack years: 15.00  Smokeless Tobacco Never Used  Tobacco Comment   Quit in his 80's.    Labs: Recent Review Flowsheet Data    Labs for ITP Cardiac and Pulmonary Rehab Latest Ref Rng & Units 03/02/2017 03/03/2017 03/04/2017   Cholestrol 0 - 200 mg/dL - - 94   LDLCALC 0 - 99 mg/dL - - 36   HDL >40 mg/dL - - 35(L)   Trlycerides <150 mg/dL - - 117   Hemoglobin A1c 4.8 - 5.6 % - 7.1(H) -   HCO3 20.0 - 28.0 mmol/L 22.1 - -   ACIDBASEDEF 0.0 - 2.0 mmol/L 1.7 - -   O2SAT % 91.1 - -       Exercise Target Goals:    Exercise Program Goal: Individual exercise prescription set  using results from initial 6 min walk test and THRR while considering  patient's activity barriers and safety.   Exercise Prescription Goal: Initial exercise prescription builds to 30-45 minutes a day of aerobic activity, 2-3 days per week.  Home exercise guidelines will be given to patient during program as part of exercise prescription that the participant will acknowledge.  Activity Barriers & Risk Stratification: Activity Barriers & Cardiac Risk Stratification - 03/22/17 1333      Activity Barriers & Cardiac Risk Stratification   Activity Barriers  Arthritis;Shortness of Breath;Deconditioning;Muscular Weakness    Cardiac Risk Stratification  High       6 Minute Walk: 6 Minute Walk    Row Name 03/22/17 1404 05/07/17 0959       6 Minute Walk   Phase  Initial  Discharge    Distance  1085 feet  1468 feet    Distance % Change  -  35.3 %    Distance Feet Change  -  383 ft    Walk Time  6 minutes  6 minutes    # of Rest Breaks  0  0    MPH  2.06  2.78    METS  2.41  3.12    RPE  13  12    VO2 Peak  8.45  10.91    Symptoms  No  No    Resting HR  5 bpm  78 bpm    Resting BP  126/72  114/58    Resting Oxygen Saturation   97 %  -    Exercise Oxygen Saturation  during 6 min walk  98 %  -    Max Ex. HR  88 bpm  95 bpm    Max Ex. BP  152/64  132/64    2 Minute Post BP  134/64  -       Oxygen Initial Assessment:   Oxygen Re-Evaluation:   Oxygen Discharge (Final Oxygen Re-Evaluation):   Initial Exercise Prescription: Initial Exercise Prescription - 03/22/17 1400      Date of Initial Exercise RX and Referring Provider   Date  03/22/17    Referring Provider  Ida Rogue MD      Treadmill   MPH  1.8    Grade  0.5    Minutes  15    METs  2.5      Recumbant Bike   Level  1    RPM  50    Watts  14  Minutes  15    METs  2      NuStep   Level  1    SPM  80    Minutes  15    METs  2      Prescription Details   Frequency (times per week)  3    Duration   Progress to 45 minutes of aerobic exercise without signs/symptoms of physical distress      Intensity   THRR 40-80% of Max Heartrate  100-135    Ratings of Perceived Exertion  11-13    Perceived Dyspnea  0-4      Progression   Progression  Continue to progress workloads to maintain intensity without signs/symptoms of physical distress.      Resistance Training   Training Prescription  Yes    Weight  3 lbs    Reps  10-15       Perform Capillary Blood Glucose checks as needed.  Exercise Prescription Changes: Exercise Prescription Changes    Row Name 03/22/17 1300 04/05/17 1600 04/16/17 0800 04/21/17 1200 05/04/17 1000     Response to Exercise   Blood Pressure (Admit)  126/72  128/70  -  104/52  130/70   Blood Pressure (Exercise)  152/64  134/60  -  158/54  162/56   Blood Pressure (Exit)  134/64  116/60  -  106/56  122/62   Heart Rate (Admit)  64 bpm  75 bpm  -  78 bpm  76 bpm   Heart Rate (Exercise)  88 bpm  101 bpm  -  116 bpm  105 bpm   Heart Rate (Exit)  68 bpm  73 bpm  -  70 bpm  80 bpm   Oxygen Saturation (Admit)  97 %  -  -  -  -   Oxygen Saturation (Exercise)  98 %  -  -  -  -   Rating of Perceived Exertion (Exercise)  13  14  -  14  12   Symptoms  none  none  -  none  none   Comments  walk test results  -  -  -  -   Duration  -  Continue with 45 min of aerobic exercise without signs/symptoms of physical distress.  -  Continue with 45 min of aerobic exercise without signs/symptoms of physical distress.  Continue with 45 min of aerobic exercise without signs/symptoms of physical distress.   Intensity  -  THRR unchanged  -  THRR unchanged  THRR unchanged     Progression   Progression  -  Continue to progress workloads to maintain intensity without signs/symptoms of physical distress.  -  Continue to progress workloads to maintain intensity without signs/symptoms of physical distress.  Continue to progress workloads to maintain intensity without signs/symptoms of physical  distress.   Average METs  -  2.84  -  3.07  3.78     Resistance Training   Training Prescription  -  Yes  -  Yes  Yes   Weight  -  3 lbs  -  7 lbs  7 lbs   Reps  -  10-15  -  10-15  10-15     Interval Training   Interval Training  -  No  -  No  No     Treadmill   MPH  -  1.8  -  1.8  1.8   Grade  -  0.5  -  0.5  0.5  Minutes  -  15  -  15  15   METs  -  2.5  -  2.5  2.5     Recumbant Bike   Level  -  1  -  1  1   Watts  -  22  -  55  55   Minutes  -  15  -  15  15   METs  -  2.63  -  3.73  3.73     NuStep   Level  -  4  -  4  4   Minutes  -  15  -  15  15   METs  -  3.4  -  3.6  5.1     Home Exercise Plan   Plans to continue exercise at  -  -  Home (comment) walking  Home (comment) walking  Home (comment) walking   Frequency  -  -  Add 2 additional days to program exercise sessions.  Add 2 additional days to program exercise sessions.  Add 2 additional days to program exercise sessions.   Initial Home Exercises Provided  -  -  04/16/17  04/16/17  04/16/17      Exercise Comments: Exercise Comments    Row Name 03/26/17 0856 05/14/17 0929         Exercise Comments   First full day of exercise!  Patient was oriented to gym and equipment including functions, settings, policies, and procedures.  Patient's individual exercise prescription and treatment plan were reviewed.  All starting workloads were established based on the results of the 6 minute walk test done at initial orientation visit.  The plan for exercise progression was also introduced and progression will be customized based on patient's performance and goals.  Carmel graduated today from  rehab with 34  sessions completed.  Details of the patient's exercise prescription and what He needs to do in order to continue the prescription and progress were discussed with patient.  Patient was given a copy of prescription and goals.  Patient verbalized understanding.  Cleve plans to continue to exercise by walking at home.          Exercise Goals and Review: Exercise Goals    Row Name 03/22/17 1408             Exercise Goals   Increase Physical Activity  Yes       Intervention  Provide advice, education, support and counseling about physical activity/exercise needs.;Develop an individualized exercise prescription for aerobic and resistive training based on initial evaluation findings, risk stratification, comorbidities and participant's personal goals.       Expected Outcomes  Short Term: Attend rehab on a regular basis to increase amount of physical activity.;Long Term: Add in home exercise to make exercise part of routine and to increase amount of physical activity.;Long Term: Exercising regularly at least 3-5 days a week.       Increase Strength and Stamina  Yes       Intervention  Provide advice, education, support and counseling about physical activity/exercise needs.;Develop an individualized exercise prescription for aerobic and resistive training based on initial evaluation findings, risk stratification, comorbidities and participant's personal goals.       Expected Outcomes  Short Term: Increase workloads from initial exercise prescription for resistance, speed, and METs.;Short Term: Perform resistance training exercises routinely during rehab and add in resistance training at home;Long Term: Improve cardiorespiratory fitness, muscular endurance and strength as measured by increased  METs and functional capacity (6MWT)       Able to understand and use rate of perceived exertion (RPE) scale  Yes       Intervention  Provide education and explanation on how to use RPE scale       Expected Outcomes  Short Term: Able to use RPE daily in rehab to express subjective intensity level;Long Term:  Able to use RPE to guide intensity level when exercising independently       Knowledge and understanding of Target Heart Rate Range (THRR)  Yes       Intervention  Provide education and explanation of THRR including how the  numbers were predicted and where they are located for reference       Expected Outcomes  Short Term: Able to state/look up THRR;Long Term: Able to use THRR to govern intensity when exercising independently;Short Term: Able to use daily as guideline for intensity in rehab       Able to check pulse independently  Yes       Intervention  Provide education and demonstration on how to check pulse in carotid and radial arteries.;Review the importance of being able to check your own pulse for safety during independent exercise       Expected Outcomes  Short Term: Able to explain why pulse checking is important during independent exercise;Long Term: Able to check pulse independently and accurately       Understanding of Exercise Prescription  Yes       Intervention  Provide education, explanation, and written materials on patient's individual exercise prescription       Expected Outcomes  Short Term: Able to explain program exercise prescription;Long Term: Able to explain home exercise prescription to exercise independently          Exercise Goals Re-Evaluation : Exercise Goals Re-Evaluation    Row Name 03/26/17 0856 04/05/17 1604 04/16/17 0806 04/21/17 1230 05/04/17 1041     Exercise Goal Re-Evaluation   Exercise Goals Review  Increase Physical Activity;Increase Strength and Stamina;Able to understand and use rate of perceived exertion (RPE) scale;Knowledge and understanding of Target Heart Rate Range (THRR)  Increase Physical Activity;Understanding of Exercise Prescription;Increase Strength and Stamina  Increase Physical Activity;Understanding of Exercise Prescription;Increase Strength and Stamina;Able to understand and use rate of perceived exertion (RPE) scale;Knowledge and understanding of Target Heart Rate Range (THRR);Able to check pulse independently  Increase Physical Activity;Understanding of Exercise Prescription;Increase Strength and Stamina  Increase Physical Activity;Understanding of Exercise  Prescription;Increase Strength and Stamina   Comments  Reviewed RPE scale, THR and program prescription with pt today.  Pt voiced understanding and was given a copy of goals to take home.   Johnie is off to a good start in rehab.  He is up to 22 watts on the recumbent bike and level 4 on the NuStep.  He seems to just go with the flow.  We will continue to monitor his progress.   Dalante is doing well in rehab.  He is feeling stronger and has more stamina already.  He has not yet started to add in any home exercise.  So we reviewed home exercise with pt today.  Pt plans to walk at home with his wife for exercise.  Reviewed THR, pulse, RPE, sign and symptoms, NTG use, and when to call 911 or MD.  Also discussed weather considerations and indoor options.  Pt voiced understanding.  Jordanny continues to do well in rehab.  He is now up to 55 watts on  the recumbent bike.  We will continue to monitor his progression.   Shea has been doing well in rehab.  He is now up to 4.6 METs on NuStep at level 4.  We will continue to monitor his progress.    Expected Outcomes  Short: Use RPE daily to regulate intensity.  Long: Follow program prescription in THR.  Short: Continue to attend regularly and review home exercise guidelines Long: Exercise more at home too.   Short: Add in at least two days a week of walking at home.  Long: Continue to increase activity levels!!  Short: Add in at least two days a week of walking at home.  Long: Continue to increase activity levels!!  Short: Increase level on bike.  Long: Continue to exercise more at home.    [place] Name 05/07/17 1000             Exercise Goal Re-Evaluation   Exercise Goals Review  Increase Physical Activity;Understanding of Exercise Prescription;Increase Strength and Stamina       Comments  Kellar improved his post 6MWT by 35%!!  He is going to continue to exercise after graduation by going to [employer] with his wife.  He has enjoyed learning about how to exercise  properly.        Expected Outcomes  Short: Increase level on bike.  Long: Continue to exercise more at home.           Discharge Exercise Prescription (Final Exercise Prescription Changes): Exercise Prescription Changes - 05/04/17 1000      Response to Exercise   Blood Pressure (Admit)  130/70    Blood Pressure (Exercise)  162/56    Blood Pressure (Exit)  122/62    Heart Rate (Admit)  76 bpm    Heart Rate (Exercise)  105 bpm    Heart Rate (Exit)  80 bpm    Rating of Perceived Exertion (Exercise)  12    Symptoms  none    Duration  Continue with 45 min of aerobic exercise without signs/symptoms of physical distress.    Intensity  THRR unchanged      Progression   Progression  Continue to progress workloads to maintain intensity without signs/symptoms of physical distress.    Average METs  3.78      Resistance Training   Training Prescription  Yes    Weight  7 lbs    Reps  10-15      Interval Training   Interval Training  No      Treadmill   MPH  1.8    Grade  0.5    Minutes  15    METs  2.5      Recumbant Bike   Level  1    Watts  55    Minutes  15    METs  3.73      NuStep   Level  4    Minutes  15    METs  5.1      Home Exercise Plan   Plans to continue exercise at  Home (comment) walking    Frequency  Add 2 additional days to program exercise sessions.    Initial Home Exercises Provided  04/16/17       Nutrition:  Target Goals: Understanding of nutrition guidelines, daily intake of sodium <1540m, cholesterol <2027m calories 30% from fat and 7% or less from saturated fats, daily to have 5 or more servings of fruits and vegetables.  Biometrics: Pre Biometrics - 03/22/17  1408      Pre Biometrics   Height  5' 9.75" (1.772 m)    Weight  227 lb 6.4 oz (103.1 kg)    Waist Circumference  43.5 inches    Hip Circumference  40.25 inches    Waist to Hip Ratio  1.08 %    BMI (Calculated)  32.85    Single Leg Stand  1.1 seconds        Nutrition Therapy  Plan and Nutrition Goals: Nutrition Therapy & Goals - 04/08/17 1515      Nutrition Therapy   Diet  Instructed patient (accompanied by his wife) on a meal plan based on heart healthy dietary guidelines and including diabetes dietary guidelines.    Drug/Food Interactions  Statins/Certain Fruits    Protein (specify units)  8    Fiber  30 grams    Whole Grain Foods  3 servings    Saturated Fats  14 max. grams    Fruits and Vegetables  5 servings/day    Sodium  1500 grams      Personal Nutrition Goals   Nutrition Goal  Read food labels for carbohydrate, sodium and saturated fat.    Personal Goal #2  Space meals 4-5 hours apart. Keep carbohydrate in 30-60 gram range at meals.    Personal Goal #3  If dinner is 6:00 or earlier, include a bedtime snack consisting of 1-2 oz protein and 1 serving of carbohydrate.      Intervention Plan   Intervention  Prescribe, educate and counsel regarding individualized specific dietary modifications aiming towards targeted core components such as weight, hypertension, lipid management, diabetes, heart failure and other comorbidities.;Nutrition handout(s) given to patient.    Expected Outcomes  Short Term Goal: Understand basic principles of dietary content, such as calories, fat, sodium, cholesterol and nutrients.;Short Term Goal: A plan has been developed with personal nutrition goals set during dietitian appointment.;Long Term Goal: Adherence to prescribed nutrition plan.       Nutrition Assessments: Nutrition Assessments - 05/10/17 0821      MEDFICTS Scores   Pre Score  6    Post Score  27    Score Difference  21       Nutrition Goals Re-Evaluation: Nutrition Goals Re-Evaluation    [place] Name 04/16/17 0815 05/07/17 1005           Goals   Current Weight  220 lb (99.8 kg)  (Pended)   -      Nutrition Goal  Read food labels for carbohydrate, sodium and saturated fat.  Spacing meals better, Bedtime snack   (Pended)   Read food labels for  carbohydrate, sodium and saturated fat.  Spacing meals out      Comment  Trevonne has been working on his diet and is losing weight.  They have really been watching the food labels and checking for fat and sodium.  He is doing better with meal timing.  He has been trying to get bedtime snack, but was eating Jello.  We talked about changing to a protein based snack like peanut butter crackers or cheese.  He said that he is not a fan of yogurt.    (Pended)   Erikson has continued to work on his diet.  His wife is helping him stay on track. They read food labels routinely now and watching sugar and salt intake very closely.  He is trying to do better about spacing his meals.  He is planning to stick to the diet  even after graduation.       Expected Outcome  Short: Protein based bedtime snack.  Long: Continue to read labels and work on heart healthy diet.   (Pended)   Short: Continue to read labels and watch portion size.  Long: Continue to follow heart healthy diet and watching sodium and sugar intake.          Nutrition Goals Discharge (Final Nutrition Goals Re-Evaluation): Nutrition Goals Re-Evaluation - 05/07/17 1005      Goals   Nutrition Goal  Read food labels for carbohydrate, sodium and saturated fat.  Spacing meals out    Comment  Aria has continued to work on his diet.  His wife is helping him stay on track. They read food labels routinely now and watching sugar and salt intake very closely.  He is trying to do better about spacing his meals.  He is planning to stick to the diet even after graduation.     Expected Outcome  Short: Continue to read labels and watch portion size.  Long: Continue to follow heart healthy diet and watching sodium and sugar intake.        Psychosocial: Target Goals: Acknowledge presence or absence of significant depression and/or stress, maximize coping skills, provide positive support system. Participant is able to verbalize types and ability to use techniques and  skills needed for reducing stress and depression.   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 This and his other health issues make it hard to participate in activities around the house that he used to do. 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 Setting desired goal for each stressor or current issue identified.;Long Term Goal: Stressors or current issues are controlled or eliminated.;Short Term goal: Identification and review with participant of any Quality of Life or Depression concerns found by scoring the questionnaire.;Long Term goal: The participant improves quality of Life and PHQ9 Scores as seen by post scores and/or verbalization of changes       Quality of Life Scores:  Quality of Life - 05/10/17 0821      Quality of Life Scores   Health/Function Pre  22.07 %    Health/Function Post  26.77 %    Health/Function % Change  21.3 %    Socioeconomic Pre  25 %    Socioeconomic Post  25.43 %    Socioeconomic % Change   1.72 %    Psych/Spiritual Pre  30 %    Psych/Spiritual Post  27.21 %    Psych/Spiritual % Change  -9.3 %    Family Pre  26 %    Family Post  27.6 %    Family %  Change  6.15 %  GLOBAL Pre  24.97 %    GLOBAL Post  26.71 %    GLOBAL % Change  6.97 %      Scores of 19 and below usually indicate a poorer quality of life in these areas.  A difference of  2-3 points is a clinically meaningful difference.  A difference of 2-3 points in the total score of the Quality of Life Index has been associated with significant improvement in overall quality of life, self-image, physical symptoms, and general health in studies assessing change in quality of life.  PHQ-9: Recent Review Flowsheet Data    Depression screen [facility] 2/9 05/10/2017 03/22/2017   Decreased Interest 0 0   Down, Depressed, Hopeless 0 0   PHQ - 2 Score 0 0   Altered sleeping 0 0   Tired, decreased energy 0 1   Change in appetite 0 0   Feeling bad or failure about yourself  0 0   Trouble concentrating 0 1   Moving slowly or fidgety/restless 0 1   Suicidal thoughts 0 0   PHQ-9 Score 0 3   Difficult doing work/chores Not difficult at all Somewhat difficult     Interpretation of Total Score  Total Score Depression Severity:  1-4 = Minimal depression, 5-9 = Mild depression, 10-14 = Moderate depression, 15-19 = Moderately severe depression, 20-27 = Severe depression   Psychosocial Evaluation and Intervention: Psychosocial Evaluation - 05/07/17 1007      Discharge Psychosocial Assessment & Intervention   Comments  Kenrick has done well in rehab.  He will be graduating soon.   He has enjoyed the program and learning what to do and how to exercise properly.  However, his favority part has been meeting people and spending time with them.  He and his wife have learned a lot in the program and plan to continue to use the tools they have learned.        Psychosocial Re-Evaluation:   Psychosocial Discharge (Final Psychosocial Re-Evaluation):   Vocational Rehabilitation: Provide vocational rehab assistance to qualifying candidates.   Vocational Rehab Evaluation & Intervention: Vocational Rehab  - 03/22/17 1332      Initial Vocational Rehab Evaluation & Intervention   Assessment shows need for Vocational Rehabilitation  No       Education: Education Goals: Education classes will be provided on a variety of topics geared toward better understanding of heart health and risk factor modification. Participant will state understanding/return demonstration of topics presented as noted by education test scores.  Learning Barriers/Preferences: Learning Barriers/Preferences - 03/22/17 1332      Learning Barriers/Preferences   Learning Barriers  Hearing;Sight;Reading    Learning Preferences  Individual Instruction;Verbal Instruction       Education Topics:  AED/CPR: - Group verbal and written instruction with the use of models to demonstrate the basic use of the AED with the basic ABC's of resuscitation.   Cardiac Rehab from 05/12/2017 in [facility] Cardiac and Pulmonary Rehab  Date  04/07/17  Educator  SB  Instruction Review Code  1- Verbalizes Understanding      General Nutrition Guidelines/Fats and Fiber: -Group instruction provided by verbal, written material, models and posters to present the general guidelines for heart healthy nutrition. Gives an explanation and review of dietary fats and fiber.   Cardiac Rehab from 05/12/2017 in [facility] Cardiac and Pulmonary Rehab  Date  03/29/17  Educator  CR  Instruction Review Code  1- Verbalizes Understanding      Controlling Sodium/Reading Food Labels: -Group  verbal and written material supporting the discussion of sodium use in heart healthy nutrition. Review and explanation with models, verbal and written materials for utilization of the food label.   Cardiac Rehab from 05/12/2017 in [facility] Cardiac and Pulmonary Rehab  Date  04/05/17  Educator  CR  Instruction Review Code  1- Verbalizes Understanding      Exercise Physiology & General Exercise Guidelines: - Group verbal and written instruction with models to review the exercise physiology  of the cardiovascular system and associated critical values. Provides general exercise guidelines with specific guidelines to those with heart or lung disease.    Cardiac Rehab from 05/12/2017 in [facility] Cardiac and Pulmonary Rehab  Date  04/14/17  Educator  [facility]  Instruction Review Code  1- Verbalizes Understanding      Aerobic Exercise & Resistance Training: - Gives group verbal and written instruction on the various components of exercise. Focuses on aerobic and resistive training programs and the benefits of this training and how to safely progress through these programs..   Cardiac Rehab from 05/12/2017 in [facility] Cardiac and Pulmonary Rehab  Date  04/19/17  Educator  [facility]  Instruction Review Code  1- Verbalizes Understanding      Flexibility, Balance, Mind/Body Relaxation: Provides group verbal/written instruction on the benefits of flexibility and balance training, including mind/body exercise modes such as yoga, pilates and tai chi.  Demonstration and skill practice provided.   Stress and Anxiety: - Provides group verbal and written instruction about the health risks of elevated stress and causes of high stress.  Discuss the correlation between heart/lung disease and anxiety and treatment options. Review healthy ways to manage with stress and anxiety.   Cardiac Rehab from 05/12/2017 in [facility] Cardiac and Pulmonary Rehab  Date  05/05/17  Educator  [facility]  Instruction Review Code  1- Verbalizes Understanding      Depression: - Provides group verbal and written instruction on the correlation between heart/lung disease and depressed mood, treatment options, and the stigmas associated with seeking treatment.   Cardiac Rehab from 05/12/2017 in [facility] Cardiac and Pulmonary Rehab  Date  04/21/17  Educator  [facility]  Instruction Review Code  1- Verbalizes Understanding      Anatomy & Physiology of the Heart: - Group verbal and written instruction and models provide basic cardiac anatomy and physiology, with  the coronary electrical and arterial systems. Review of Valvular disease and Heart Failure   Cardiac Rehab from 05/12/2017 in [facility] Cardiac and Pulmonary Rehab  Date  05/03/17  Educator  SB  Instruction Review Code  1- Verbalizes Understanding      Cardiac Procedures: - Group verbal and written instruction to review commonly prescribed medications for heart disease. Reviews the medication, class of the drug, and side effects. Includes the steps to properly store meds and maintain the prescription regimen. (beta blockers and nitrates)   Cardiac Rehab from 05/12/2017 in [facility] Cardiac and Pulmonary Rehab  Date  03/31/17  Educator  SB  Instruction Review Code  1- Verbalizes Understanding      Cardiac Medications I: - Group verbal and written instruction to review commonly prescribed medications for heart disease. Reviews the medication, class of the drug, and side effects. Includes the steps to properly store meds and maintain the prescription regimen.   Cardiac Rehab from 05/12/2017 in [facility] Cardiac and Pulmonary Rehab  Date  05/10/17  Educator  SB  Instruction Review Code  1- Verbalizes Understanding      Cardiac Medications II: -Group verbal  and written instruction to review commonly prescribed medications for heart disease. Reviews the medication, class of the drug, and side effects. (all other drug classes)   Cardiac Rehab from 05/12/2017 in [facility] Cardiac and Pulmonary Rehab  Date  04/28/17  Educator  [facility]  Instruction Review Code  1- Verbalizes Understanding       Go Sex-Intimacy & Heart Disease, Get SMART - Goal Setting: - Group verbal and written instruction through game format to discuss heart disease and the return to sexual intimacy. Provides group verbal and written material to discuss and apply goal setting through the application of the S.M.A.R.T. Method.   Cardiac Rehab from 05/12/2017 in [facility] Cardiac and Pulmonary Rehab  Date  03/31/17  Educator  SB  Instruction Review Code  1-  Verbalizes Understanding      Other Matters of the Heart: - Provides group verbal, written materials and models to describe Stable Angina and Peripheral Artery. Includes description of the disease process and treatment options available to the cardiac patient.   Cardiac Rehab from 05/12/2017 in [facility] Cardiac and Pulmonary Rehab  Date  05/03/17  Educator  SB  Instruction Review Code  1- Verbalizes Understanding      Exercise & Equipment Safety: - Individual verbal instruction and demonstration of equipment use and safety with use of the equipment.   Cardiac Rehab from 05/12/2017 in [facility] Cardiac and Pulmonary Rehab  Date  03/22/17  Educator  [facility]  Instruction Review Code  1- Verbalizes Understanding      Infection Prevention: - Provides verbal and written material to individual with discussion of infection control including proper hand washing and proper equipment cleaning during exercise session.   Cardiac Rehab from 05/12/2017 in [facility] Cardiac and Pulmonary Rehab  Date  03/22/17  Educator  [facility]  Instruction Review Code  1- Verbalizes Understanding      Falls Prevention: - Provides verbal and written material to individual with discussion of falls prevention and safety.   Cardiac Rehab from 05/12/2017 in [facility] Cardiac and Pulmonary Rehab  Date  03/22/17  Educator  [facility]  Instruction Review Code  1- Verbalizes Understanding      Diabetes: - Individual verbal and written instruction to review signs/symptoms of diabetes, desired ranges of glucose level fasting, after meals and with exercise. Acknowledge that pre and post exercise glucose checks will be done for 3 sessions at entry of program.   Cardiac Rehab from 05/12/2017 in [facility] Cardiac and Pulmonary Rehab  Date  03/22/17  Educator  [facility]  Instruction Review Code  1- Verbalizes Understanding      Know Your Numbers and Risk Factors: -Group verbal and written instruction about important numbers in your health.  Discussion of what are risk  factors and how they play a role in the disease process.  Review of Cholesterol, Blood Pressure, Diabetes, and BMI and the role they play in your overall health.   Cardiac Rehab from 05/12/2017 in [facility] Cardiac and Pulmonary Rehab  Date  04/28/17  Educator  [facility]  Instruction Review Code  1- Verbalizes Understanding      Sleep Hygiene: -Provides group verbal and written instruction about how sleep can affect your health.  Define sleep hygiene, discuss sleep cycles and impact of sleep habits. Review good sleep hygiene tips.    Other: -Provides group and verbal instruction on various topics (see comments)   Knowledge Questionnaire Score: Knowledge Questionnaire Score - 05/10/17 0821      Knowledge Questionnaire Score   Pre Score  21/28    Post Score  26/28       Core Components/Risk Factors/Patient Goals at Admission: Personal Goals and Risk Factors at Admission - 03/22/17 1325      Core Components/Risk Factors/Patient Goals on Admission    Weight Management  Yes;Weight Loss    Intervention  Weight Management: Develop a combined nutrition and exercise program designed to reach desired caloric intake, while maintaining appropriate intake of nutrient and fiber, sodium and fats, and appropriate energy expenditure required for the weight goal.;Weight Management: Provide education and appropriate resources to help participant work on and attain dietary goals.;Weight Management/Obesity: Establish reasonable short term and long term weight goals.    Admit Weight  226 lb (102.5 kg)    Goal Weight: Short Term  222 lb (100.7 kg)    Goal Weight: Long Term  199 lb (90.3 kg)    Expected Outcomes  Short Term: Continue to assess and modify interventions until short term weight is achieved;Long Term: Adherence to nutrition and physical activity/exercise program aimed toward attainment of established weight goal;Weight Loss: Understanding of general recommendations for a balanced deficit meal plan, which  promotes 1-2 lb weight loss per week and includes a negative energy balance of [phone] kcal/d;Understanding recommendations for meals to include 15-35% energy as protein, 25-35% energy from fat, 35-60% energy from carbohydrates, less than 220m of dietary cholesterol, 20-35 gm of total fiber daily;Understanding of distribution of calorie intake throughout the day with the consumption of 4-5 meals/snacks    Diabetes  Yes    Intervention  Provide education about signs/symptoms and action to take for hypo/hyperglycemia.;Provide education about proper nutrition, including hydration, and aerobic/resistive exercise prescription along with prescribed medications to achieve blood glucose in normal ranges: Fasting glucose 65-99 mg/dL    Expected Outcomes  Short Term: Participant verbalizes understanding of the signs/symptoms and immediate care of hyper/hypoglycemia, proper foot care and importance of medication, aerobic/resistive exercise and nutrition plan for blood glucose control.;Long Term: Attainment of HbA1C < 7%.    Heart Failure  Yes    Intervention  Provide a combined exercise and nutrition program that is supplemented with education, support and counseling about heart failure. Directed toward relieving symptoms such as shortness of breath, decreased exercise tolerance, and extremity edema.    Expected Outcomes  Improve functional capacity of life    Hypertension  Yes    Intervention  Provide education on lifestyle modifcations including regular physical activity/exercise, weight management, moderate sodium restriction and increased consumption of fresh fruit, vegetables, and low fat dairy, alcohol moderation, and smoking cessation.;Monitor prescription use compliance.    Expected Outcomes  Short Term: Continued assessment and intervention until BP is < 140/972mHG in hypertensive participants. < 130/8032mG in hypertensive participants with diabetes, heart failure or chronic kidney disease.;Long Term:  Maintenance of blood pressure at goal levels.    Lipids  Yes    Intervention  Provide education and support for participant on nutrition & aerobic/resistive exercise along with prescribed medications to achieve LDL <68m73mDL >40mg59m Expected Outcomes  Short Term: Participant states understanding of desired cholesterol values and is compliant with medications prescribed. Participant is following exercise prescription and nutrition guidelines.;Long Term: Cholesterol controlled with medications as prescribed, with individualized exercise RX and with personalized nutrition plan. Value goals: LDL < 68mg,68m > 40 mg.    Stress  Yes DelmasNeithanot been able to participate in yard work or perform househole activities like he used to due to his 3  strokes and other health issues    Intervention  Offer individual and/or small group education and counseling on adjustment to heart disease, stress management and health-related lifestyle change. Teach and support self-help strategies.;Refer participants experiencing significant psychosocial distress to appropriate mental health specialists for further evaluation and treatment. When possible, include family members and significant others in education/counseling sessions.    Expected Outcomes  Short Term: Participant demonstrates changes in health-related behavior, relaxation and other stress management skills, ability to obtain effective social support, and compliance with psychotropic medications if prescribed.;Long Term: Emotional wellbeing is indicated by absence of clinically significant psychosocial distress or social isolation.       Core Components/Risk Factors/Patient Goals Review:  Goals and Risk Factor Review    Row Name 04/16/17 0811 05/07/17 1003           Core Components/Risk Factors/Patient Goals Review   Personal Goals Review  Weight Management/Obesity;Hypertension;Lipids;Diabetes  Weight Management/Obesity;Hypertension;Lipids;Diabetes       Review  Johnie is doing good in rehab.  He is losing weight and has already met his short term goal as he is down to 220lbs.  He has been really watching his diet and will now add in more exercise at home to help some more.  Blood sugars have been running on the lower side and he checks them twice a day.  He was 74 this morning!!  Blood presures have good too and he has been checking them twice a day.  He is doing well on his medications.  Ariel will be graduating soon!!  He improved his walk test today by 35%!  He is going to go to the gym to continue to work on his weight loss.  His wife continues to help him with his diet.  He has continued to check his blood pressures and blood sugars.  He has actually stopped taking his diabetic medications and is due for a follow up and blood work soon.        Expected Outcomes  Short: Keep watching blood sugars.  Long: Continue to work on weight loss.   Short: Continue to watch sugars.  Long: Continue to work on risk factor modifications.          Core Components/Risk Factors/Patient Goals at Discharge (Final Review):  Goals and Risk Factor Review - 05/07/17 1003      Core Components/Risk Factors/Patient Goals Review   Personal Goals Review  Weight Management/Obesity;Hypertension;Lipids;Diabetes    Review  Joshus will be graduating soon!!  He improved his walk test today by 35%!  He is going to go to the gym to continue to work on his weight loss.  His wife continues to help him with his diet.  He has continued to check his blood pressures and blood sugars.  He has actually stopped taking his diabetic medications and is due for a follow up and blood work soon.      Expected Outcomes  Short: Continue to watch sugars.  Long: Continue to work on risk factor modifications.        ITP Comments: ITP Comments    Row Name 03/22/17 1308 03/31/17 0605 04/28/17 0606 05/14/17 0929     ITP Comments  Med Review completed. Initial ITP created. Diagnosis can be found in  [facility] encounter 03/02/17  30 Day review. Continue with ITP unless directed changes per Medical Director review.  New to program  30 day review. Continue with ITP unless directed changes per Medical Director  Discharge ITP sent and signed  by Dr. Sabra Heck.  Discharge Summary routed to PCP and cardiologist.       Comments: Discharge ITP

## 2017-05-14 NOTE — Progress Notes (Signed)
Daily Session Note  Patient Details  Name: Adam Douglas MRN: [id number] Date of Birth: 29-Aug-1950 Referring Provider:     Cardiac Rehab from 03/22/2017 in [facility] Cardiac and Pulmonary Rehab  Referring Provider  Ida Rogue MD      Encounter Date: 05/14/2017  Check In: Session Check In - 05/14/17 0924      Check-In   Location  [facility]-Cardiac & Pulmonary Rehab    Staff Present  Renita Papa, RN BSN;Anuradha Chabot Luan Pulling, MA, RCEP, CCRP, Exercise Physiologist;Amanda Oletta Darter, [place], ACSM CEP, Exercise Physiologist    Supervising physician immediately available to respond to emergencies  See telemetry face sheet for immediately available ER MD    Medication changes reported      No    Fall or balance concerns reported     No    Warm-up and Cool-down  Performed on first and last piece of equipment    Resistance Training Performed  Yes    VAD Patient?  No      Pain Assessment   Currently in Pain?  No/denies          Social History   Tobacco Use  Smoking Status Former Smoker  . Packs/day: 1.00  . Years: 15.00  . Pack years: 15.00  Smokeless Tobacco Never Used  Tobacco Comment   Quit in his 21's.    Goals Met:  Independence with exercise equipment Exercise tolerated well No report of cardiac concerns or symptoms Strength training completed today  Goals Unmet:  Not Applicable  Comments: Pt able to follow exercise prescription today without complaint.  Will continue to monitor for progression.  Adam Douglas graduated today from  rehab with 34  sessions completed.  Details of the patient's exercise prescription and what He needs to do in order to continue the prescription and progress were discussed with patient.  Patient was given a copy of prescription and goals.  Patient verbalized understanding.  Leaf plans to continue to exercise by walking at home.    Dr. Emily Filbert is Medical Director for Adam Douglas and [facility].

## 2017-07-22 ENCOUNTER — Other Ambulatory Visit: Payer: Self-pay | Admitting: Cardiovascular Disease

## 2017-07-22 DIAGNOSIS — I2510 Atherosclerotic heart disease of native coronary artery without angina pectoris: Secondary | ICD-10-CM

## 2017-07-22 DIAGNOSIS — Z87891 Personal history of nicotine dependence: Secondary | ICD-10-CM

## 2017-07-28 ENCOUNTER — Ambulatory Visit (INDEPENDENT_AMBULATORY_CARE_PROVIDER_SITE_OTHER): Payer: 59

## 2017-07-28 ENCOUNTER — Other Ambulatory Visit: Payer: Self-pay

## 2017-07-28 DIAGNOSIS — Z87891 Personal history of nicotine dependence: Secondary | ICD-10-CM | POA: Diagnosis not present

## 2017-07-28 DIAGNOSIS — I255 Ischemic cardiomyopathy: Secondary | ICD-10-CM

## 2017-07-28 DIAGNOSIS — I2510 Atherosclerotic heart disease of native coronary artery without angina pectoris: Secondary | ICD-10-CM | POA: Diagnosis not present

## 2017-08-01 ENCOUNTER — Encounter: Payer: Self-pay | Admitting: Cardiovascular Disease

## 2017-08-11 ENCOUNTER — Telehealth: Payer: Self-pay | Admitting: Cardiovascular Disease

## 2017-08-11 NOTE — Telephone Encounter (Signed)
S/w patient's wife, ok per DPR. 8/7- 129/78 morning 8/6-106/61 morning, 86/53 evening   8/5-111/43 8/4-75/41, later in day 126/84 8/3-80/60 morning, 105/59 evening   Morning pressure are prior to medications.  She has not kept track of HR's and they have no idea what that is running. Patient denies chest pain, shortness of breath or dizziness. On 8/4 when BP 75/41, patient slept a lot that day.  Discussed with Eula Listen, PA-C who advised patient to hold Entresto until Monday and then call us with an update on BP/HR.  Called and s/w wife. She verbalized understanding to have patient hold Entresto, monitor BP/HR through the weekend and then call us on Monday with an update.

## 2017-08-11 NOTE — Telephone Encounter (Signed)
Pt c/o BP issue: STAT if pt c/o blurred vision, one-sided weakness or slurred speech  1. What are your last 5 BP readings? This morning 129/78 8/6-106/61 morning, 86/53 evening   8/5-111/43 8/4-75/41 8/3-80/60 morning, 105/59 evening   2. Are you having any other symptoms (ex. Dizziness, headache, blurred vision, passed out)? No other symptoms  3. What is your BP issue?  LOW

## 2017-08-16 NOTE — Telephone Encounter (Signed)
Pt wife is calling back with more BP readings: 8/7- 129/78 morning,  eveing 136/70 HR 78 8/8- 135/70 morning HR 73, evening 150/72 HR 68 8/9 -130/60 HR 67 morning, evening 120/70 HR 66 8/10- 147/70 HR 68 morning, evening 145/70 HR 70 8/11 - 130/68 HR 70 morning, evening 140/68 HR 68 8/12 -129/69 HR 64

## 2017-08-16 NOTE — Telephone Encounter (Signed)
To Dr. Gollan to review.  

## 2017-08-16 NOTE — Telephone Encounter (Signed)
To Ryan, PA to review.  

## 2017-08-16 NOTE — Telephone Encounter (Signed)
BP is more stable off Entresto. Would get input from primary cardiologist to see if they would like to resume Entresto (previously on low dose 24/26 mg bid) vs using ACEi/ARB.

## 2017-08-17 NOTE — Telephone Encounter (Signed)
Patient wide returning call.

## 2017-08-17 NOTE — Telephone Encounter (Signed)
Would continue to hold the entresto He had been losing weight likely causing drop in pressures Would start losartan 25 mg daily We can update his medication list

## 2017-08-17 NOTE — Telephone Encounter (Signed)
No answer.Voicemail is full. °

## 2017-08-18 NOTE — Telephone Encounter (Signed)
MyChart message sent to patient with Dr Windell Hummingbird recommendations and to let us know what pharmacy to send the Losartan.

## 2017-08-18 NOTE — Telephone Encounter (Signed)
No answer. Mailbox is full and cannot accept messages at this time. 

## 2017-08-20 ENCOUNTER — Other Ambulatory Visit: Payer: Self-pay | Admitting: *Deleted

## 2017-08-20 MED ORDER — LOSARTAN POTASSIUM 25 MG PO TABS: 25.0000 mg | ORAL_TABLET | Freq: Every day | ORAL | 3 refills | Status: DC

## 2017-08-20 NOTE — Telephone Encounter (Signed)
Issued resolved via MyChart messages with patient's wife. Closing this encounter.

## 2017-08-26 ENCOUNTER — Ambulatory Visit (INDEPENDENT_AMBULATORY_CARE_PROVIDER_SITE_OTHER): Payer: Medicare HMO | Admitting: Internal Medicine

## 2017-08-26 ENCOUNTER — Encounter: Payer: Self-pay | Admitting: Internal Medicine

## 2017-08-26 VITALS — BP 166/67 | HR 69 | Ht 69.0 in | Wt 206.0 lb

## 2017-08-26 DIAGNOSIS — I10 Essential (primary) hypertension: Secondary | ICD-10-CM | POA: Diagnosis not present

## 2017-08-26 DIAGNOSIS — I255 Ischemic cardiomyopathy: Secondary | ICD-10-CM | POA: Diagnosis not present

## 2017-08-26 DIAGNOSIS — I5022 Chronic systolic (congestive) heart failure: Secondary | ICD-10-CM | POA: Diagnosis not present

## 2017-08-26 MED ORDER — APIXABAN 5 MG PO TABS: 5.0000 mg | ORAL_TABLET | Freq: Two times a day (BID) | ORAL | 6 refills | Status: DC

## 2017-08-26 MED ORDER — SPIRONOLACTONE 25 MG PO TABS: 12.5000 mg | ORAL_TABLET | Freq: Every day | ORAL | 3 refills | Status: DC

## 2017-08-26 NOTE — Patient Instructions (Addendum)
Medication Instructions: - Your physician has recommended you make the following change in your medication:   1) STOP imdur (isosorbide) 2) STOP plavix (clopidegrel) 3) START aldactone (spironolactone) 25 mg- take 1/2 tablet (12.5 mg) by mouth once daily 4) START eliquis 5 mg- take 1 tablet by mouth TWICE daily (please call to activate the free 30-day trial card and take this to the pharmacy to get an additional 30 days for free).   Eliquis 5 mg Lot: [id number] Exp: 11/21 # 3 boxes given  Labwork: - Your physician recommends that you return for lab work in: 2 weeks (BMP) & 6 weeks (BMP)   Procedures/Testing: - Your physician has requested that you have an echocardiogram- in 10 weeks. Echocardiography is a painless test that uses sound waves to create images of your heart. It provides your doctor with information about the size and shape of your heart and how well your heart's chambers and valves are working. This procedure takes approximately one hour. There are no restrictions for this procedure.   Follow-Up: - Your physician recommends that you schedule a follow-up appointment in: 12 weeks with Dr. Graciela Husbands.   Any Additional Special Instructions Will Be Listed Below (If Applicable).     If you need a refill on your cardiac medications before your next appointment, please call your pharmacy.

## 2017-08-26 NOTE — Progress Notes (Signed)
ELECTROPHYSIOLOGY CONSULT NOTE  Patient ID: Usiel Astarita, MRN: [id number], DOB/AGE: 68-Aug-1952 68 y.o. Admit date: (Not on file) Date of Consult: 08/26/2017  Primary Physician: Etheleen Nicks, NP Primary Cardiologist: Dyllen Menning is a 67 y.o. male who is being seen today for the evaluation of ICD at the request of TG.    HPI Coady Train is a 67 y.o. male referred for consideration of an ICD.  He has a history of a non-STEMI with which he presented 2/19.  He underwent catheterization demonstrating severe three-vessel disease and was treated with medical therapy.  Cardiomyopathy was felt to be out of proportion to his ischemic disease.  He has a history of prior strokes x3.  He developed atrial fibrillation in hospital.  He is currently treated with DAPT.  He denies chest pain shortness of breath or peripheral edema.  ECGs have demonstrated left bundle branch block.  No history of syncope except remotely.  Occasional tachypalpitations.  DATE TEST EF   2/19 LHC  <20 % Severe 3V CAD>> med Rx  2/19 Echo   20-25 %   7/19 Echo 30-35%    Date Cr K Hgb  3/19 1.26 4.9 11.5             Past Medical History:  Diagnosis Date  . CAD (coronary artery disease)    a. 02/2017 NSTEMI/Cath: LM 40d, LAD 81m, 19m/d, D2 small w/ sev prox/m dzs, LCX nl, OM1 sev diff dzs - small vessel, OM2 80-90p/m, RCA dominant 48m, RPDA 50, EF <20%-->Med Rx.  . Diabetes mellitus without complication (HCC)   . HFrEF (heart failure with reduced ejection fraction) (HCC)    a. 02/2017 Echo: EF 20-25%.  Marland Kitchen Hyperlipidemia   . Hypertension   . Ischemic cardiomyopathy    a. 02/2017 Echo: EF 20-25%, diff HK, Gr2 DD, mild MR.  . LBBB (left bundle branch block)    a. Noted 02/2017. Pt denies any known prior history of LBBB (not present on 2014 [facility] ECG interpretation).  . Morbid obesity (HCC)   . Stroke [facility])    a. Ocular strokes x 3 - prev eval in [facility] for first 2, [facility] for last one.     Surgical History:  Past Surgical History:  Procedure Laterality Date  . CERVICAL DISC SURGERY    . CHOLECYSTECTOMY    . LEFT HEART CATH AND CORONARY ANGIOGRAPHY N/A 03/03/2017   Procedure: LEFT HEART CATH AND CORONARY ANGIOGRAPHY;  Surgeon: Antonieta Iba, MD;  Location: [facility] INVASIVE CV LAB;  Service: Cardiovascular;  Laterality: N/A;     Home Meds: Prior to Admission medications   Medication Sig Start Date End Date Taking? Authorizing Provider  aspirin EC 81 MG tablet Take 1 tablet (81 mg total) by mouth daily. 03/09/17  Yes Creig Hines, NP  atorvastatin (LIPITOR) 40 MG tablet Take 80 mg by mouth daily.   Yes [provider]  carvedilol (COREG) 6.25 MG tablet Take 1 tablet (6.25 mg total) by mouth 2 (two) times daily with a meal. 03/09/17  Yes Creig Hines, NP  citalopram (CELEXA) 40 MG tablet Take 40 mg by mouth daily.   Yes [provider]  clopidogrel (PLAVIX) 75 MG tablet Take 1 tablet (75 mg total) by mouth daily with breakfast. 03/09/17  Yes Creig Hines, NP  furosemide (LASIX) 20 MG tablet Take 1 tablet (20 mg total) by mouth daily. 03/09/17  Yes Creig Hines, NP  insulin  aspart (NOVOLOG) 100 UNIT/ML injection Inject 15 Units into the skin 3 (three) times daily with meals.   Yes [provider]  insulin NPH-regular Human (NOVOLIN 70/30) (70-30) 100 UNIT/ML injection Inject 20-50 Units into the skin 2 (two) times daily with a meal. 30 units in the morning and 20 at night 03/06/17  Yes Altamese Dilling, MD  isosorbide mononitrate (IMDUR) 30 MG 24 hr tablet Take 0.5 tablets (15 mg total) by mouth daily. 03/09/17  Yes Creig Hines, NP  losartan (COZAAR) 25 MG tablet Take 1 tablet (25 mg total) by mouth daily. 08/20/17 11/18/17 Yes Gollan, Tollie Pizza, MD  metFORMIN (GLUCOPHAGE-XR) 500 MG 24 hr tablet Take 500 mg by mouth 2 (two) times daily.   Yes [provider]  nitroGLYCERIN (NITROSTAT)  0.4 MG SL tablet Place 1 tablet (0.4 mg total) under the tongue every 5 (five) minutes as needed. 05/04/17  Yes Gollan, Tollie Pizza, MD  omeprazole (PRILOSEC) 20 MG capsule Take 20 mg by mouth 2 (two) times daily before a meal.   Yes [provider]  Polyethyl Glycol-Propyl Glycol (SYSTANE) 0.4-0.3 % SOLN Apply 1 drop to eye daily.   Yes [provider]  traMADol (ULTRAM) 50 MG tablet Take 50 mg by mouth every 8 (eight) hours as needed for pain.   Yes [provider]    Allergies: No Known Allergies  Social History   Socioeconomic History  . Marital status: Married    Spouse name: Not on file  . Number of children: Not on file  . Years of education: Not on file  . Highest education level: Not on file  Occupational History  . 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 Financial resource strain: Not on file  . Food insecurity:    Worry: Not on file    Inability: Not on file  . Transportation needs:    Medical: Not on file    Non-medical: Not on file  Tobacco Use  . Smoking status: Former Smoker    Packs/day: 1.00    Years: 15.00    Pack years: 15.00  . Smokeless tobacco: Never Used  . Tobacco comment: Quit in his 60's.  Substance and Sexual Activity  . Alcohol use: No  . Drug use: No  . Sexual activity: Not on file  Lifestyle  . Physical activity:    Days per week: Not on file    Minutes per session: Not on file  . Stress: Not on file  Relationships  . Social connections:    Talks on phone: Not on file    Gets together: Not on file    Attends religious service: Not on file    Active member of club or organization: Not on file    Attends meetings of clubs or organizations: Not on file    Relationship status: Not on file  . Intimate partner violence:    Fear of current or ex partner: Not on file    Emotionally abused: Not on file    Physically abused: Not on file    Forced sexual activity: Not on file    Other Topics Concern  . Not on file  Social History Narrative   Lives in [place] with his wife.  Children and 15 grandchildren nearby.  He does not routinely exercise.     Family History  Problem Relation Age of Onset  . CAD Mother 44  . Heart attack Mother   .  CAD Father 62  . Heart attack Father   . CAD Sister      ROS:  Please see the history of present illness.     All other systems reviewed and negative.    Physical Exam: Blood pressure (!) 166/67, pulse 69, height 5\' 9"  (1.753 m), weight 206 lb (93.4 kg). General: Well developed, well nourished male in no acute distress. Head: Normocephalic, atraumatic, sclera non-icteric, no xanthomas, nares are without discharge. EENT: normal  Lymph Nodes:  none Neck: Negative for carotid bruits. JVD not elevated. Back:without scoliosis kyphosis Lungs: Clear bilaterally to auscultation without wheezes, rales, or rhonchi. Breathing is unlabored. Heart: RRR with S1 S2. No  murmur . No rubs, or gallops appreciated. Abdomen: Soft, non-tender, non-distended with normoactive bowel sounds. No hepatomegaly. No rebound/guarding. No obvious abdominal masses. Msk:  Strength and tone appear normal for age. Extremities: No clubbing or cyanosis. No edema.  Distal pedal pulses are 2+ and equal bilaterally. Skin: Warm and Dry Neuro: Alert and oriented X 3. CN III-XII intact Grossly normal sensory and motor function . Psych:  Responds to questions appropriately with a normal affect.      Labs: Cardiac Enzymes No results for input(s): CKTOTAL, CKMB, TROPONINI in the last 72 hours. CBC Lab Results  Component Value Date   WBC 9.3 03/05/2017   HGB 11.5 (L) 03/05/2017   HCT 33.9 (L) 03/05/2017   MCV 95.0 03/05/2017   PLT 169 03/05/2017   PROTIME: No results for input(s): LABPROT, INR in the last 72 hours. Chemistry No results for input(s): NA, K, CL, CO2, BUN, CREATININE, CALCIUM, PROT, BILITOT, ALKPHOS, ALT, AST, GLUCOSE in the last 168  hours.  Invalid input(s): LABALBU Lipids Lab Results  Component Value Date   CHOL 94 03/04/2017   HDL 35 (L) 03/04/2017   LDLCALC 36 03/04/2017   TRIG 117 03/04/2017   BNP No results found for: PROBNP Thyroid Function Tests: No results for input(s): TSH, T4TOTAL, T3FREE, THYROIDAB in the last 72 hours.  Invalid input(s): FREET3 Miscellaneous No results found for: DDIMER  Radiology/Studies:  No results found.  EKG: Sinus rhythm with frequent PACs at 69 Intervals 19/17/44 Axis left -36 Left bundle branch block    Assessment and Plan:  Cardiomyopathy ischemic/nonischemic  Congestive heart failure class I-IIa  Left bundle branch block  Hypertension with some orthostatic hypotension  Strokes x3  Atrial fibrillation  Memory deficits following stroke   The patient has persistent but improving LV function on medical therapy.  Recently his Sherryll Burger was discontinued because of low blood pressure.  He is feeling better with less dizziness.  We will add Aldactone 12.5 mg to see if we can improve long-term outcomes with his cardiomyopathy as well as his left ventricular function.  We will check his potassium in 2 weeks and in 6 weeks.  Repeat assessment of LV function in about 10 weeks.  We will discontinue his nitrates as he has no symptoms of angina.  Given his history of atrial fibrillation and prior strokes, we will discontinue his Plavix and add apixaban in conjunction with his aspirin for stroke risk reduction.  Will need close follow-up of his hypertension and orthostasis    Sherryl Manges

## 2017-09-13 ENCOUNTER — Other Ambulatory Visit (INDEPENDENT_AMBULATORY_CARE_PROVIDER_SITE_OTHER): Payer: Medicare HMO

## 2017-09-13 DIAGNOSIS — I255 Ischemic cardiomyopathy: Secondary | ICD-10-CM | POA: Diagnosis not present

## 2017-09-13 DIAGNOSIS — I5022 Chronic systolic (congestive) heart failure: Secondary | ICD-10-CM

## 2017-09-14 LAB — BASIC METABOLIC PANEL
BUN / CREAT RATIO: 23 (ref 10–24)
BUN: 21 mg/dL (ref 8–27)
CO2: 23 mmol/L (ref 20–29)
Calcium: 8.4 mg/dL — ABNORMAL LOW (ref 8.6–10.2)
Chloride: 108 mmol/L — ABNORMAL HIGH (ref 96–106)
Creatinine, Ser: 0.93 mg/dL (ref 0.76–1.27)
GFR, EST AFRICAN AMERICAN: 98 mL/min/{1.73_m2} (ref >59)
GFR, EST NON AFRICAN AMERICAN: 85 mL/min/{1.73_m2} (ref >59)
Glucose: 218 mg/dL — ABNORMAL HIGH (ref 65–99)
POTASSIUM: 4.3 mmol/L (ref 3.5–5.2)
SODIUM: 142 mmol/L (ref 134–144)

## 2017-10-07 ENCOUNTER — Other Ambulatory Visit (INDEPENDENT_AMBULATORY_CARE_PROVIDER_SITE_OTHER): Payer: Medicare HMO

## 2017-10-07 ENCOUNTER — Telehealth: Payer: Self-pay | Admitting: Cardiovascular Disease

## 2017-10-07 DIAGNOSIS — I255 Ischemic cardiomyopathy: Secondary | ICD-10-CM

## 2017-10-07 DIAGNOSIS — I5022 Chronic systolic (congestive) heart failure: Secondary | ICD-10-CM

## 2017-10-07 NOTE — Telephone Encounter (Signed)
Patient given Eliquis 5 mg tablets 2 boxes 14 tabs each total of 28.

## 2017-10-07 NOTE — Telephone Encounter (Signed)
Forms received for application and will have provider sign when he returns.

## 2017-10-07 NOTE — Telephone Encounter (Signed)
Medication Samples have been provided to the patient.  Drug name: Eliquis       Strength: 5mg         Qty: 28 LOT: [id number]  Exp.Date: 11/21    Aurelio Jew 8:59 AM 10/07/2017

## 2017-10-07 NOTE — Telephone Encounter (Signed)
Patient in office for labs Dropping off [place] assistance forms to be completed Placed in nurse box While here would like to know if samples are available Patient calling the office for samples of medication:   1.  What medication and dosage are you requesting samples for? Eliquis 5 MG  2.  Are you currently out of this medication? Will be out in a couple days

## 2017-10-08 LAB — BASIC METABOLIC PANEL
BUN/Creatinine Ratio: 28 — ABNORMAL HIGH (ref 10–24)
BUN: 31 mg/dL — ABNORMAL HIGH (ref 8–27)
CALCIUM: 9.4 mg/dL (ref 8.6–10.2)
CO2: 19 mmol/L — ABNORMAL LOW (ref 20–29)
CREATININE: 1.1 mg/dL (ref 0.76–1.27)
Chloride: 103 mmol/L (ref 96–106)
GFR, EST AFRICAN AMERICAN: 80 mL/min/{1.73_m2} (ref >59)
GFR, EST NON AFRICAN AMERICAN: 69 mL/min/{1.73_m2} (ref >59)
Glucose: 195 mg/dL — ABNORMAL HIGH (ref 65–99)
Potassium: 4.7 mmol/L (ref 3.5–5.2)
SODIUM: 141 mmol/L (ref 134–144)

## 2017-10-21 MED ORDER — APIXABAN 5 MG PO TABS: 5.0000 mg | ORAL_TABLET | Freq: Two times a day (BID) | ORAL | 3 refills | Status: DC

## 2017-10-21 NOTE — Addendum Note (Signed)
Addended by: Bryna Colander on: 10/21/2017 09:04 AM   Modules accepted: Orders

## 2017-10-21 NOTE — Telephone Encounter (Signed)
No forms for assistance received. She did not want to fill them out when she received samples last time per CMA. Completed forms, printed information, and will try to reach her to come in and sign application.

## 2017-10-21 NOTE — Telephone Encounter (Addendum)
Spoke with patients wife per release form regarding assistance forms and that I was following up on those. She states that she brought them in and left them with front staff. Following up on forms and I did not see where we received a response. Assistance application completed, signed by provider, and samples for patient done. Spoke with patients wife per release form and reviewed that I have this available and the documents needed to submit. She will come into office on Monday and will ask for me when she gets here. Medication Samples have been provided to the patient.  Drug name: Eliquis       Strength: 5 mg        Qty: 4 boxes  LOT: [id number]  Exp.Date: NOV 2021

## 2017-10-25 NOTE — Telephone Encounter (Signed)
Patient and wife came into office to provide documentation for patient assistance application. All paperwork has been signed and will fax all of this over to assistance program.

## 2017-11-03 NOTE — Progress Notes (Signed)
Cardiology Office Note  Date:  11/04/2017   ID:  Adam Douglas, DOB 1950-11-17, MRN [id number]  PCP:  Etheleen Nicks, NP   Chief Complaint  Patient presents with  . OTHER    6 month f/u no complaints today. Meds reviewed verbally with pt.    HPI:  67 year old male with a history of  diabetes,  Hypertension, hyperlipidemia,  Former smoker, 20 years ocular strokes,  Admission 03/03/2017  for non-STEMI with finding of left bundle branch block,  severe multivessel CAD and an ischemic cardiomyopathy with an EF of 20-25% Who presents for follow up of his cardiomyopathy and CAD   diagnostic catheterization 03/03/2017 revealing moderate to severe diffuse, small vessel coronary artery disease with an EF of less than 20% by ventriculography.   Moderate to severe mid and distal LAD disease severe disease of small diagonal vessels, Severe disease of small OM1 and om2 vessels Medical management recommended  Echocardiogram 02/2017 showed an EF of 20-25%. Repeat echocardiogram July 2019 with improvement up to 30 to 35% Repeat echocardiogram performed today again with ejection fraction 30 to 35%  Difficulty affording Eliquis Started recently by Dr. Graciela Husbands for prior strokes, atrial fibrillation in the hospital Patient reports Sherryll Burger was changed to losartan by Dr. Darlyn Read spironolactone, Imdur held  Active but no regular exercise program Spends many hours a day in his basement making hobby boats  Taking Lasix 20 mg once a day, weight stable Rarely taking extra Lasix for weight gain Closely monitors fluid intake  EKG personally reviewed by myself on todays visit Shows normal sinus rhythm rate 67 bpm , left bundle branch block,   PMH:   has a past medical history of CAD (coronary artery disease), Diabetes mellitus without complication (HCC), HFrEF (heart failure with reduced ejection fraction) (HCC), Hyperlipidemia, Hypertension, Ischemic cardiomyopathy, LBBB (left bundle branch  block), Morbid obesity (HCC), and Stroke (HCC).  PSH:    Past Surgical History:  Procedure Laterality Date  . CERVICAL DISC SURGERY    . CHOLECYSTECTOMY    . LEFT HEART CATH AND CORONARY ANGIOGRAPHY N/A 03/03/2017   Procedure: LEFT HEART CATH AND CORONARY ANGIOGRAPHY;  Surgeon: Antonieta Iba, MD;  Location: [facility] INVASIVE CV LAB;  Service: Cardiovascular;  Laterality: N/A;    Current Outpatient Medications  Medication Sig Dispense Refill  . apixaban (ELIQUIS) 5 MG TABS tablet Take 1 tablet (5 mg total) by mouth 2 (two) times daily. 60 tablet 6  . aspirin EC 81 MG tablet Take 1 tablet (81 mg total) by mouth daily. 90 tablet 3  . atorvastatin (LIPITOR) 40 MG tablet Take 40 mg by mouth daily.     . carvedilol (COREG) 6.25 MG tablet Take 1 tablet (6.25 mg total) by mouth 2 (two) times daily with a meal. 180 tablet 2  . citalopram (CELEXA) 40 MG tablet Take 40 mg by mouth daily.    . furosemide (LASIX) 20 MG tablet Take 1 tablet (20 mg total) by mouth daily. 90 tablet 2  . insulin aspart (NOVOLOG) 100 UNIT/ML injection Inject 15 Units into the skin 3 (three) times daily with meals.    . insulin NPH-regular Human (NOVOLIN 70/30) (70-30) 100 UNIT/ML injection Inject 20-50 Units into the skin 2 (two) times daily with a meal. 30 units in the morning and 20 at night 10 mL 11  . losartan (COZAAR) 25 MG tablet Take 1 tablet (25 mg total) by mouth daily. 90 tablet 3  . metFORMIN (GLUCOPHAGE-XR) 500 MG 24 hr tablet Take 500  mg by mouth 2 (two) times daily.    . nitroGLYCERIN (NITROSTAT) 0.4 MG SL tablet Place 1 tablet (0.4 mg total) under the tongue every 5 (five) minutes as needed. 25 tablet 2  . omeprazole (PRILOSEC) 20 MG capsule Take 20 mg by mouth 2 (two) times daily before a meal.    . spironolactone (ALDACTONE) 25 MG tablet Take 0.5 tablets (12.5 mg total) by mouth daily. 30 tablet 3  . traMADol (ULTRAM) 50 MG tablet Take 50 mg by mouth every 8 (eight) hours as needed for pain.     No current  facility-administered medications for this visit.      Allergies:   Patient has no known allergies.   Social History:  The patient  reports that he has quit smoking. He has a 15.00 pack-year smoking history. He has never used smokeless tobacco. He reports that he does not drink alcohol or use drugs.   Family History:   family history includes CAD in his sister; CAD (age of onset: 9) in his father; CAD (age of onset: 69) in his mother; Heart attack in his father and mother.    Review of Systems: Review of Systems  Constitutional: Negative.   Respiratory: Negative.   Cardiovascular: Negative.   Gastrointestinal: Negative.   Musculoskeletal: Negative.   Neurological: Negative.   Psychiatric/Behavioral: Negative.   All other systems reviewed and are negative.    PHYSICAL EXAM: VS:  BP 122/62 (BP Location: Left Arm, Patient Position: Sitting, Cuff Size: Normal)   Pulse 67   Ht 5\' 10"  (1.778 m)   Wt 219 lb (99.3 kg)   BMI 31.42 kg/m  , BMI Body mass index is 31.42 kg/m. Constitutional:  oriented to person, place, and time. No distress.  HENT:  Head: Grossly normal Eyes:  no discharge. No scleral icterus.  Neck: No JVD, no carotid bruits  Cardiovascular: Regular rate and rhythm, no murmurs appreciated Pulmonary/Chest: Clear to auscultation bilaterally, no wheezes or rails Abdominal: Soft.  no distension.  no tenderness.  Musculoskeletal: Normal range of motion Neurological:  normal muscle tone. Coordination normal. No atrophy Skin: Skin warm and dry Psychiatric: normal affect, pleasant  Recent Labs: 03/02/2017: ALT 31; B Natriuretic Peptide 488.0 03/03/2017: TSH 0.895 03/05/2017: Hemoglobin 11.5; Platelets 169 10/07/2017: BUN 31; Creatinine, Ser 1.10; Potassium 4.7; Sodium 141    Lipid Panel Lab Results  Component Value Date   CHOL 94 03/04/2017   HDL 35 (L) 03/04/2017   LDLCALC 36 03/04/2017   TRIG 117 03/04/2017     Wt Readings from Last 3 Encounters:  11/04/17  219 lb (99.3 kg)  08/26/17 206 lb (93.4 kg)  05/03/17 219 lb (99.3 kg)     ASSESSMENT AND PLAN:  Atherosclerosis of native coronary artery of native heart with stable angina pectoris (HCC) - Plan: EKG 12-Lead Severe underlying coronary disease  Currently denies any anginal symptoms Lipids at goal  Ischemic cardiomyopathy Prelim echocardiogram no significant change in ejection fraction Scheduled to see Dr. Graciela Husbands several weeks, Long discussion concerning ICD's Wife reports he may not be interested, very nervous Discussed benefits if offered Stressed the importance of regular exercise program  Chronic systolic heart failure (HCC) Appears relatively euvolemic Off the Entresto, unclear if this was a cost issue Tolerating losartan, spironolactone, Coreg Off isosorbide  Type 2 diabetes mellitus with other circulatory complication, with long-term current use of insulin (HCC) Managed by primary care, stressed importance of aggressive management  Hyperlipidemia LDL goal <70   No changes to his  medications Numbers at goal  Essential hypertension Blood pressure is well controlled on today's visit. No changes made to the medications.  Disposition:   F/U  6 months   Total encounter time more than 25 minutes  Greater than 50% was spent in counseling and coordination of care with the patient    Orders Placed This Encounter  Procedures  . EKG 12-Lead     Signed, [name], M.D., Ph.D. 11/04/2017  [facility] [place], [place] [phone]

## 2017-11-04 ENCOUNTER — Other Ambulatory Visit: Payer: Self-pay

## 2017-11-04 ENCOUNTER — Ambulatory Visit (INDEPENDENT_AMBULATORY_CARE_PROVIDER_SITE_OTHER): Payer: Medicare HMO | Admitting: Cardiovascular Disease

## 2017-11-04 ENCOUNTER — Encounter: Payer: Self-pay | Admitting: Cardiovascular Disease

## 2017-11-04 ENCOUNTER — Ambulatory Visit (INDEPENDENT_AMBULATORY_CARE_PROVIDER_SITE_OTHER): Payer: Medicare HMO

## 2017-11-04 VITALS — BP 122/62 | HR 67 | Ht 70.0 in | Wt 219.0 lb

## 2017-11-04 DIAGNOSIS — I25118 Atherosclerotic heart disease of native coronary artery with other forms of angina pectoris: Secondary | ICD-10-CM

## 2017-11-04 DIAGNOSIS — I5022 Chronic systolic (congestive) heart failure: Secondary | ICD-10-CM | POA: Diagnosis not present

## 2017-11-04 DIAGNOSIS — E1159 Type 2 diabetes mellitus with other circulatory complications: Secondary | ICD-10-CM

## 2017-11-04 DIAGNOSIS — E785 Hyperlipidemia, unspecified: Secondary | ICD-10-CM

## 2017-11-04 DIAGNOSIS — Z794 Long term (current) use of insulin: Secondary | ICD-10-CM

## 2017-11-04 DIAGNOSIS — I255 Ischemic cardiomyopathy: Secondary | ICD-10-CM | POA: Diagnosis not present

## 2017-11-04 DIAGNOSIS — I10 Essential (primary) hypertension: Secondary | ICD-10-CM

## 2017-11-04 NOTE — Patient Instructions (Addendum)
Medication Instructions:  No changes  If you need a refill on your cardiac medications before your next appointment, please call your pharmacy.    Lab work: No new labs needed   If you have labs (blood work) drawn today and your tests are completely normal, you will receive your results only by: Marland Kitchen MyChart Message (if you have MyChart) OR . A paper copy in the mail If you have any lab test that is abnormal or we need to change your treatment, we will call you to review the results.   Testing/Procedures: No new testing needed   Follow-Up: At [facility], you and your health needs are our priority.  As part of our continuing mission to provide you with exceptional heart care, we have created designated Provider Care Teams.  These Care Teams include your primary Cardiologist (physician) and Advanced Practice Providers (APPs -  Physician Assistants and Nurse Practitioners) who all work together to provide you with the care you need, when you need it.  . You will need a follow up appointment in 6 months .   Please call our office 2 months in advance to schedule this appointment.    . Providers on your designated Care Team:   . Nicolasa Ducking, NP . Eula Listen, PA-C . Marisue Ivan, PA-C  Any Other Special Instructions Will Be Listed Below (If Applicable).  For educational health videos Log in to : [email or web address] Or : [email or web address], password : [email or web address]   Cardioverter Defibrillator Implantation An implantable cardioverter defibrillator (ICD) is a small device that is placed under the skin in the chest or abdomen. An ICD consists of a battery, a small computer (pulse generator), and wires (leads) that go into the heart. An ICD is used to detect and correct two types of dangerous irregular heartbeats (arrhythmias):  A rapid heart rhythm (tachycardia).  An arrhythmia in which the lower chambers of the heart (ventricles) contract in an uncoordinated way (fibrillation).  When  an ICD detects tachycardia, it sends a low-energy shock to the heart to restore the heartbeat to normal (cardioversion). This signal is usually painless. If cardioversion does not work or if the ICD detects fibrillation, it delivers a high-energy shock to the heart (defibrillation) to restart the heart. This shock may feel like a strong jolt in the chest. Your health care provider may prescribe an ICD if:  You have had an arrhythmia that originated in the ventricles.  Your heart has been damaged by a disease or heart condition.  Sometimes, ICDs are programmed to act as a device called a pacemaker. Pacemakers can be used to treat a slow heartbeat (bradycardia) or tachycardia by taking over the heart rate with electrical impulses. Tell a health care provider about:  Any allergies you have.  All medicines you are taking, including vitamins, herbs, eye drops, creams, and over-the-counter medicines.  Any problems you or family members have had with anesthetic medicines.  Any blood disorders you have.  Any surgeries you have had.  Any medical conditions you have.  Whether you are pregnant or may be pregnant. What are the risks? Generally, this is a safe procedure. However, problems may occur, including:  Swelling, bleeding, or bruising.  Infection.  Blood clots.  Damage to other structures or organs, such as nerves, blood vessels, or the heart.  Allergic reactions to medicines used during the procedure.  What happens before the procedure? Staying hydrated Follow instructions from your health care provider about hydration, which may include:  Up to 2  hours before the procedure - you may continue to drink clear liquids, such as water, clear fruit juice, black coffee, and plain tea.  Eating and drinking restrictions Follow instructions from your health care provider about eating and drinking, which may include:  8 hours before the procedure - stop eating heavy meals or foods such  as meat, fried foods, or fatty foods.  6 hours before the procedure - stop eating light meals or foods, such as toast or cereal.  6 hours before the procedure - stop drinking milk or drinks that contain milk.  2 hours before the procedure - stop drinking clear liquids.  Medicine Ask your health care provider about:  Changing or stopping your normal medicines. This is important if you take diabetes medicines or blood thinners.  Taking medicines such as aspirin and ibuprofen. These medicines can thin your blood. Do not take these medicines before your procedure if your doctor tells you not to.  Tests  You may have blood tests.  You may have a test to check the electrical signals in your heart (electrocardiogram, ECG).  You may have imaging tests, such as a chest X-ray. General instructions  For 24 hours before the procedure, stop using products that contain nicotine or tobacco, such as cigarettes and e-cigarettes. If you need help quitting, ask your health care provider.  Plan to have someone take you home from the hospital or clinic.  You may be asked to shower with a germ-killing soap. What happens during the procedure?  To reduce your risk of infection: ? Your health care team will wash or sanitize their hands. ? Your skin will be washed with soap. ? Hair may be removed from the surgical area.  Small monitors will be put on your body. They will be used to check your heart, blood pressure, and oxygen level.  An IV tube will be inserted into one of your veins.  You will be given one or more of the following: ? A medicine to help you relax (sedative). ? A medicine to numb the area (local anesthetic). ? A medicine to make you fall asleep (general anesthetic).  Leads will be guided through a blood vessel into your heart and attached to your heart muscles. Depending on the ICD, the leads may go into one ventricle or they may go into both ventricles and into an upper chamber of  the heart. An X-ray machine (fluoroscope) will be usedto help guide the leads.  A small incision will be made to create a deep pocket under your skin.  The pulse generator will be placed into the pocket.  The ICD will be tested.  The incision will be closed with stitches (sutures), skin glue, or staples.  A bandage (dressing) will be placed over the incision. This procedure may vary among health care providers and hospitals. What happens after the procedure?  Your blood pressure, heart rate, breathing rate, and blood oxygen level will be monitored often until the medicines you were given have worn off.  A chest X-ray will be taken to check that the ICD is in the right place.  You will need to stay in the hospital for 1-2 days so your health care provider can make sure your ICD is working.  Do not drive for 24 hours if you received a sedative. Ask your health care provider when it is safe for you to drive.  You may be given an identification card explaining that you have an ICD. Summary  An implantable  cardioverter defibrillator (ICD) is a small device that is placed under the skin in the chest or abdomen. It is used to detect and correct dangerous irregular heartbeats (arrhythmias).  An ICD consists of a battery, a small computer (pulse generator), and wires (leads) that go into the heart.  When an ICD detects rapid heart rhythm (tachycardia), it sends a low-energy shock to the heart to restore the heartbeat to normal (cardioversion). If cardioversion does not work or if the ICD detects uncoordinated heart contractions (fibrillation), it delivers a high-energy shock to the heart (defibrillation) to restart the heart.  You will need to stay in the hospital for 1-2 days to make sure your ICD is working. This information is not intended to replace advice given to you by your health care provider. Make sure you discuss any questions you have with your health care provider. Document  Released: 09/13/2001 Document Revised: 01/01/2016 Document Reviewed: 01/01/2016 Elsevier Interactive Patient Education  2017 [employer].

## 2017-11-18 ENCOUNTER — Ambulatory Visit (INDEPENDENT_AMBULATORY_CARE_PROVIDER_SITE_OTHER): Payer: Medicare HMO | Admitting: Internal Medicine

## 2017-11-18 ENCOUNTER — Encounter: Payer: Self-pay | Admitting: Internal Medicine

## 2017-11-18 VITALS — BP 142/76 | HR 63 | Ht 70.0 in | Wt 226.5 lb

## 2017-11-18 DIAGNOSIS — I5022 Chronic systolic (congestive) heart failure: Secondary | ICD-10-CM

## 2017-11-18 DIAGNOSIS — I428 Other cardiomyopathies: Secondary | ICD-10-CM

## 2017-11-18 DIAGNOSIS — I255 Ischemic cardiomyopathy: Secondary | ICD-10-CM

## 2017-11-18 DIAGNOSIS — I447 Left bundle-branch block, unspecified: Secondary | ICD-10-CM

## 2017-11-18 MED ORDER — SACUBITRIL-VALSARTAN 24-26 MG PO TABS: 1.0000 | ORAL_TABLET | Freq: Two times a day (BID) | ORAL | Status: DC

## 2017-11-18 MED ORDER — NITROGLYCERIN 0.4 MG SL SUBL: 0.4000 mg | SUBLINGUAL_TABLET | SUBLINGUAL | 2 refills | Status: DC | PRN

## 2017-11-18 NOTE — Progress Notes (Signed)
Patient Care Team: Etheleen Nicks, NP as PCP - General   HPI  Adam Douglas is a 67 y.o. male seen in follow-up for consideration of an ICD.  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  He has a combined nonischemic/ischemic cardiomyopathy.  He has a history of prior strokes.  Previously on DAPT; at last visit changed to apixaban no interval bleeding  No significant shortness of breath or peripheral edema.  No chest pain.  ECGs have demonstrated left bundle branch block.  No history of syncope except remotely.  Occasional tachypalpitations.  DATE TEST EF   2/19 LHC  <20 % Severe 3V CAD>> med Rx  2/19 Echo   20-25 %   7/19 Echo 30-35%   10/19 Echo  30-35%    Date Cr K Hgb  3/19 1.26 4.9 11.5  6/19   11.9   10/19 1.10 4.7        Past Medical History:  Diagnosis Date  . CAD (coronary artery disease)    a. 02/2017 NSTEMI/Cath: LM 40d, LAD 35m, 40m/d, D2 small w/ sev prox/m dzs, LCX nl, OM1 sev diff dzs - small vessel, OM2 80-90p/m, RCA dominant 64m, RPDA 50, EF <20%-->Med Rx.  . Diabetes mellitus without complication (HCC)   . HFrEF (heart failure with reduced ejection fraction) (HCC)    a. 02/2017 Echo: EF 20-25%.  Marland Kitchen Hyperlipidemia   . Hypertension   . Ischemic cardiomyopathy    a. 02/2017 Echo: EF 20-25%, diff HK, Gr2 DD, mild MR.  . LBBB (left bundle branch block)    a. Noted 02/2017. Pt denies any known prior history of LBBB (not present on 2014 [facility] ECG interpretation).  . Morbid obesity (HCC)   . Stroke [facility])    a. Ocular strokes x 3 - prev eval in [facility] for first 2, [facility] for last one.    Past Surgical History:  Procedure Laterality Date  . CERVICAL DISC SURGERY    . CHOLECYSTECTOMY    . LEFT HEART CATH AND CORONARY ANGIOGRAPHY N/A 03/03/2017   Procedure: LEFT HEART CATH AND CORONARY ANGIOGRAPHY;  Surgeon: Antonieta Iba, MD;  Location: [facility] INVASIVE CV LAB;   Service: Cardiovascular;  Laterality: N/A;    Current Meds  Medication Sig  . apixaban (ELIQUIS) 5 MG TABS tablet Take 1 tablet (5 mg total) by mouth 2 (two) times daily.  Marland Kitchen aspirin EC 81 MG tablet Take 1 tablet (81 mg total) by mouth daily.  Marland Kitchen atorvastatin (LIPITOR) 40 MG tablet Take 40 mg by mouth daily.   . carvedilol (COREG) 6.25 MG tablet Take 1 tablet (6.25 mg total) by mouth 2 (two) times daily with a meal.  . citalopram (CELEXA) 40 MG tablet Take 40 mg by mouth daily.  . furosemide (LASIX) 20 MG tablet Take 1 tablet (20 mg total) by mouth daily.  . insulin aspart (NOVOLOG) 100 UNIT/ML injection Inject 15 Units into the skin 3 (three) times daily with meals.  . insulin NPH-regular Human (NOVOLIN 70/30) (70-30) 100 UNIT/ML injection Inject 20-50 Units into the skin 2 (two) times daily with a meal. 30 units in the morning and 20 at night  . losartan (COZAAR) 25 MG tablet Take 1 tablet (25 mg total) by mouth daily.  . metFORMIN (GLUCOPHAGE-XR) 500 MG 24 hr tablet Take 500 mg by mouth 2 (two) times daily.  Marland Kitchen omeprazole (PRILOSEC) 20 MG capsule Take 20 mg by mouth 2 (  two) times daily before a meal.  . spironolactone (ALDACTONE) 25 MG tablet Take 0.5 tablets (12.5 mg total) by mouth daily.  . traMADol (ULTRAM) 50 MG tablet Take 50 mg by mouth every 8 (eight) hours as needed for pain.  . [DISCONTINUED] nitroGLYCERIN (NITROSTAT) 0.4 MG SL tablet Place 1 tablet (0.4 mg total) under the tongue every 5 (five) minutes as needed.    No Known Allergies    Review of Systems negative except from HPI and PMH  Physical Exam BP (!) 142/76 (BP Location: Left Arm, Patient Position: Sitting, Cuff Size: Normal)   Pulse 63   Ht 5\' 10"  (1.778 m)   Wt 226 lb 8 oz (102.7 kg)   BMI 32.50 kg/m  Well developed and well nourished in no acute distress HENT normal E scleral and icterus clear Neck Supple JVP flat; carotids brisk and full Clear to ausculation Regular rate and rhythm, no murmurs gallops  or rub Soft with active bowel sounds No clubbing cyanosis  Edema Alert and oriented, grossly normal motor and sensory function Skin Warm and Dry  ECG demonstrates sinus rhythm at 63 Interval 21/16/44 Left bundle branch block    Assessment and Plan:  Cardiomyopathy ischemic/nonischemic  Congestive heart failure class I-IIa  Left bundle branch block  Hypertension with some orthostatic hypotension  Strokes x3  Atrial fibrillation  Anemia   Memory deficits following stroke  He remains on the brink regarding device implantation.  Given his age and his nonischemic/ischemic myopathy the benefits remain modest.  He is inclined towards trying medications a little longer.  We will resume low-dose Entresto as he has had no interval dizziness.  We reviewed the blood pressure reports from last February where he got quite hypotensive.  He will need a reassessment of his potassium which is been stable and his hemoglobin which in March was low but was stable in June  We spent more than 50% of our >25 min visit in face to face counseling regarding the above    Current medicines are reviewed at length with the patient today .  The patient does not  have concerns regarding medicines.

## 2017-11-18 NOTE — Patient Instructions (Signed)
Medication Instructions:  - Your physician has recommended you make the following change in your medication:   1) STOP losartan 2) RESUME entresto 24/26 mg- take 1 tablet by mouth twice daily  If you need a refill on your cardiac medications before your next appointment, please call your pharmacy.   Lab work: - none ordered  If you have labs (blood work) drawn today and your tests are completely normal, you will receive your results only by: Marland Kitchen MyChart Message (if you have MyChart) OR . A paper copy in the mail If you have any lab test that is abnormal or we need to change your treatment, we will call you to review the results.  Testing/Procedures: - Your physician has requested that you have an echocardiogram in 3 months (just prior to follow up with Dr. Graciela Husbands). Echocardiography is a painless test that uses sound waves to create images of your heart. It provides your doctor with information about the size and shape of your heart and how well your heart's chambers and valves are working. This procedure takes approximately one hour. There are no restrictions for this procedure.  Follow-Up: At [facility], you and your health needs are our priority.  As part of our continuing mission to provide you with exceptional heart care, we have created designated Provider Care Teams.  These Care Teams include your primary Cardiologist (physician) and Advanced Practice Providers (APPs -  Physician Assistants and Nurse Practitioners) who all work together to provide you with the care you need, when you need it. . 3 months with Dr. Graciela Husbands  Any Other Special Instructions Will Be Listed Below (If Applicable). - N/A

## 2018-02-02 ENCOUNTER — Telehealth: Payer: Self-pay | Admitting: Cardiovascular Disease

## 2018-02-02 MED ORDER — APIXABAN 5 MG PO TABS: 5.0000 mg | ORAL_TABLET | Freq: Two times a day (BID) | ORAL | 6 refills | Status: DC

## 2018-02-02 NOTE — Telephone Encounter (Addendum)
Pt is requesting samples of Eliquis.  Were his pt assistance forms faxed in? Last refill was sent in August, (Pt wt 102.7, age 68, serum creatinine 1.10, CrCl 93.36) sent to his pharmacy, but he may need samples to help out financially until assistance is verified.

## 2018-02-02 NOTE — Telephone Encounter (Signed)
Spoke with patients wife per release form and reviewed that applications for assistance require social security statements for both him and her along with signature on those forms. All forms completed and instructed her to come by the office to have him sign those and will send to see if they are approved. She verbalized understanding with no further questions at this time.

## 2018-02-02 NOTE — Telephone Encounter (Signed)
Duplicate

## 2018-02-02 NOTE — Telephone Encounter (Signed)
Marilynne Halsted, RN       3:25 PM  Note    Pt is requesting samples of Eliquis.  Were his pt assistance forms faxed in? Last refill was sent in August, refill sent to his pharmacy, but he may need samples to help out financially until assistance is verified.

## 2018-02-02 NOTE — Addendum Note (Signed)
Addended by: Marilynne Halsted on: 02/02/2018 03:38 PM   Modules accepted: Orders

## 2018-02-02 NOTE — Telephone Encounter (Signed)
Patient calling the office for samples of medication:   1.  What medication and dosage are you requesting samples for? Eliquis 5 mg po BID   2.  Are you currently out of this medication? Yes x 4 days

## 2018-02-03 ENCOUNTER — Other Ambulatory Visit: Payer: Self-pay | Admitting: *Deleted

## 2018-02-03 MED ORDER — APIXABAN 5 MG PO TABS: 5.0000 mg | ORAL_TABLET | Freq: Two times a day (BID) | ORAL | 3 refills | Status: DC

## 2018-02-03 NOTE — Telephone Encounter (Signed)
Forms completed, faxed to assistance program, and placed in file in Samples closet.

## 2018-02-07 ENCOUNTER — Other Ambulatory Visit: Payer: Self-pay

## 2018-02-07 MED ORDER — CARVEDILOL 6.25 MG PO TABS: 6.2500 mg | ORAL_TABLET | Freq: Two times a day (BID) | ORAL | 2 refills | Status: DC

## 2018-02-07 MED ORDER — FUROSEMIDE 20 MG PO TABS: 20.0000 mg | ORAL_TABLET | Freq: Every day | ORAL | 2 refills | Status: DC

## 2018-02-07 NOTE — Telephone Encounter (Signed)
Requested Prescriptions   Signed Prescriptions Disp Refills  . carvedilol (COREG) 6.25 MG tablet 180 tablet 2    Sig: Take 1 tablet (6.25 mg total) by mouth 2 (two) times daily with a meal.    Authorizing Provider: Nicolasa Ducking RONALD    Ordering User: Margrett Rud  . furosemide (LASIX) 20 MG tablet 90 tablet 2    Sig: Take 1 tablet (20 mg total) by mouth daily.    Authorizing Provider: Creig Hines    Ordering User: Margrett Rud

## 2018-02-08 ENCOUNTER — Other Ambulatory Visit: Payer: Medicare HMO

## 2018-02-10 ENCOUNTER — Ambulatory Visit: Payer: Medicare HMO | Admitting: Internal Medicine

## 2018-02-22 ENCOUNTER — Encounter: Payer: Self-pay | Admitting: Internal Medicine

## 2018-02-22 NOTE — Progress Notes (Signed)
Fax form received from [employer] patient assistance foundation stating they received the patient's application for Eliquis and it is under review.

## 2018-03-15 ENCOUNTER — Ambulatory Visit (INDEPENDENT_AMBULATORY_CARE_PROVIDER_SITE_OTHER): Payer: Medicare Other

## 2018-03-15 DIAGNOSIS — I447 Left bundle-branch block, unspecified: Secondary | ICD-10-CM | POA: Diagnosis not present

## 2018-03-15 DIAGNOSIS — I255 Ischemic cardiomyopathy: Secondary | ICD-10-CM

## 2018-03-15 DIAGNOSIS — I5022 Chronic systolic (congestive) heart failure: Secondary | ICD-10-CM | POA: Diagnosis not present

## 2018-03-15 DIAGNOSIS — I428 Other cardiomyopathies: Secondary | ICD-10-CM | POA: Diagnosis not present

## 2018-03-17 ENCOUNTER — Ambulatory Visit: Payer: Medicare HMO | Admitting: Internal Medicine

## 2018-03-17 ENCOUNTER — Encounter: Payer: Self-pay | Admitting: *Deleted

## 2018-03-17 ENCOUNTER — Encounter: Payer: Self-pay | Admitting: Internal Medicine

## 2018-03-17 ENCOUNTER — Other Ambulatory Visit: Payer: Self-pay

## 2018-03-17 VITALS — BP 132/74 | HR 69 | Ht 70.0 in | Wt 233.8 lb

## 2018-03-17 DIAGNOSIS — I5022 Chronic systolic (congestive) heart failure: Secondary | ICD-10-CM | POA: Diagnosis not present

## 2018-03-17 DIAGNOSIS — I428 Other cardiomyopathies: Secondary | ICD-10-CM | POA: Diagnosis not present

## 2018-03-17 DIAGNOSIS — I447 Left bundle-branch block, unspecified: Secondary | ICD-10-CM

## 2018-03-17 DIAGNOSIS — I255 Ischemic cardiomyopathy: Secondary | ICD-10-CM | POA: Diagnosis not present

## 2018-03-17 DIAGNOSIS — I480 Paroxysmal atrial fibrillation: Secondary | ICD-10-CM

## 2018-03-17 DIAGNOSIS — Z01812 Encounter for preprocedural laboratory examination: Secondary | ICD-10-CM

## 2018-03-17 NOTE — Progress Notes (Signed)
Patient Care Team: Etheleen Nicks, NP as PCP - General   HPI  Adam Douglas is a 68 y.o. male seen in follow-up for consideration of an ICD.  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  He has a combined nonischemic/ischemic cardiomyopathy.  He has a history of prior strokes.  Previously on DAPT; at last visit changed to apixaban no interval bleeding , however he has not been taking it for the last month because of cost.  Intermittent peripheral edema that coincides with worsening dyspnea on exertion.  No orthopnea or nocturnal dyspnea.  No chest pain.  Syncope or palpitations.   ECGs have demonstrated left bundle branch block.  No history of syncope except remotely.  Occasional tachypalpitations.  DATE TEST EF   2/19 LHC  <20 % Severe 3V CAD>> med Rx  2/19 Echo   20-25 %   7/19 Echo 30-35%   10/19 Echo  30-35%   3/20 Echo  30-35%    Date Cr K Hgb  3/19 1.26 4.9 11.5  6/19   11.9   10/19 1.10 4.7    Thromboembolic risk factors ( age -25 , HTN-1, TIA/CVA-2, DM-1, Vasc disease -1, CHF-1) for a CHADSVASc Score of >/=7     Past Medical History:  Diagnosis Date  . CAD (coronary artery disease)    a. 02/2017 NSTEMI/Cath: LM 40d, LAD 77m, 32m/d, D2 small w/ sev prox/m dzs, LCX nl, OM1 sev diff dzs - small vessel, OM2 80-90p/m, RCA dominant 22m, RPDA 50, EF <20%-->Med Rx.  . Diabetes mellitus without complication (HCC)   . HFrEF (heart failure with reduced ejection fraction) (HCC)    a. 02/2017 Echo: EF 20-25%.  Marland Kitchen Hyperlipidemia   . Hypertension   . Ischemic cardiomyopathy    a. 02/2017 Echo: EF 20-25%, diff HK, Gr2 DD, mild MR.  . LBBB (left bundle branch block)    a. Noted 02/2017. Pt denies any known prior history of LBBB (not present on 2014 [facility] ECG interpretation).  . Morbid obesity (HCC)   . Stroke [facility])    a. Ocular strokes x 3 - prev eval in [facility] for first 2, [facility] for last  one.    Past Surgical History:  Procedure Laterality Date  . CERVICAL DISC SURGERY    . CHOLECYSTECTOMY    . LEFT HEART CATH AND CORONARY ANGIOGRAPHY N/A 03/03/2017   Procedure: LEFT HEART CATH AND CORONARY ANGIOGRAPHY;  Surgeon: Antonieta Iba, MD;  Location: [facility] INVASIVE CV LAB;  Service: Cardiovascular;  Laterality: N/A;    Current Meds  Medication Sig  . aspirin EC 81 MG tablet Take 1 tablet (81 mg total) by mouth daily.  Marland Kitchen atorvastatin (LIPITOR) 40 MG tablet Take 40 mg by mouth daily.   . carvedilol (COREG) 6.25 MG tablet Take 1 tablet (6.25 mg total) by mouth 2 (two) times daily with a meal.  . citalopram (CELEXA) 40 MG tablet Take 40 mg by mouth daily.  . furosemide (LASIX) 20 MG tablet Take 1 tablet (20 mg total) by mouth daily.  . insulin aspart (NOVOLOG) 100 UNIT/ML injection Inject 15 Units into the skin 3 (three) times daily with meals.  . insulin detemir (LEVEMIR) 100 UNIT/ML injection Inject 20 Units into the skin as needed.  . metFORMIN (GLUCOPHAGE-XR) 500 MG 24 hr tablet Take 500 mg by mouth 2 (two) times daily.  . nitroGLYCERIN (NITROSTAT) 0.4 MG SL tablet Place 1 tablet (0.4  mg total) under the tongue every 5 (five) minutes as needed.  . omeprazole (PRILOSEC) 20 MG capsule Take 20 mg by mouth 2 (two) times daily before a meal.  . sacubitril-valsartan (ENTRESTO) 24-26 MG Take 1 tablet by mouth 2 (two) times daily.  . traMADol (ULTRAM) 50 MG tablet Take 50 mg by mouth every 8 (eight) hours as needed for pain.    No Known Allergies    Review of Systems negative except from HPI and PMH  Physical Exam BP 132/74 (BP Location: Left Arm, Patient Position: Sitting, Cuff Size: Normal)   Pulse 69   Ht 5' 10" (1.778 m)   Wt 233 lb 12 oz (106 kg)   BMI 33.54 kg/m  Well developed and nourished in no acute distress HENT normal Neck supple with JVP-  flat  Clear Regular rate and rhythm, no murmurs or gallops Abd-soft with active BS No Clubbing cyanosis edema  Skin-warm and dry A & Oriented  Grossly normal sensory and motor function  ECG sinus rhythm at 69 with PACs Interval 20/17/44 Left bundle branch block     Assessment and Plan:  Cardiomyopathy ischemic/nonischemic  Congestive heart failure class 2b-3a  Left bundle branch block  Hypertension    Strokes x3  Atrial fibrillation  Anemia   Memory deficits following stroke  LV function has failed to improve despite guideline directed therapy.  Continues with symptoms of class IIb-III a heart failure we have reviewed again CRT/CRT-D implantation.  He would like to proceed.  Have reviewed the potential benefits and risks of ICD implantation including but not limited to death, perforation of heart or lung, lead dislodgement, infection,  device malfunction and inappropriate shocks.  The patient and family express understanding  and are willing to proceed.    Is not taking his Eliquis because of cost.  We will look into Xarelto and Pradaxa as alternative; his thromboembolic risk is very high with a CHADS-VASc score of greater than or equal to 7.  Check hemoglobin within the history of anemia Current medicines are reviewed at length with the patient today .  The patient does not  have concerns regarding medicines.  

## 2018-03-17 NOTE — H&P (View-Only) (Signed)
Patient Care Team: Etheleen Nicks, NP as PCP - General   HPI  Adam Douglas is a 68 y.o. male seen in follow-up for consideration of an ICD.  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  He has a combined nonischemic/ischemic cardiomyopathy.  He has a history of prior strokes.  Previously on DAPT; at last visit changed to apixaban no interval bleeding , however he has not been taking it for the last month because of cost.  Intermittent peripheral edema that coincides with worsening dyspnea on exertion.  No orthopnea or nocturnal dyspnea.  No chest pain.  Syncope or palpitations.   ECGs have demonstrated left bundle branch block.  No history of syncope except remotely.  Occasional tachypalpitations.  DATE TEST EF   2/19 LHC  <20 % Severe 3V CAD>> med Rx  2/19 Echo   20-25 %   7/19 Echo 30-35%   10/19 Echo  30-35%   3/20 Echo  30-35%    Date Cr K Hgb  3/19 1.26 4.9 11.5  6/19   11.9   10/19 1.10 4.7    Thromboembolic risk factors ( age -26 , HTN-1, TIA/CVA-2, DM-1, Vasc disease -1, CHF-1) for a CHADSVASc Score of >/=7     Past Medical History:  Diagnosis Date  . CAD (coronary artery disease)    a. 02/2017 NSTEMI/Cath: LM 40d, LAD 77m, 32m/d, D2 small w/ sev prox/m dzs, LCX nl, OM1 sev diff dzs - small vessel, OM2 80-90p/m, RCA dominant 22m, RPDA 50, EF <20%-->Med Rx.  . Diabetes mellitus without complication (HCC)   . HFrEF (heart failure with reduced ejection fraction) (HCC)    a. 02/2017 Echo: EF 20-25%.  Marland Kitchen Hyperlipidemia   . Hypertension   . Ischemic cardiomyopathy    a. 02/2017 Echo: EF 20-25%, diff HK, Gr2 DD, mild MR.  . LBBB (left bundle branch block)    a. Noted 02/2017. Pt denies any known prior history of LBBB (not present on 2014 [facility] ECG interpretation).  . Morbid obesity (HCC)   . Stroke [facility])    a. Ocular strokes x 3 - prev eval in [facility] for first 2, [facility] for last  one.    Past Surgical History:  Procedure Laterality Date  . CERVICAL DISC SURGERY    . CHOLECYSTECTOMY    . LEFT HEART CATH AND CORONARY ANGIOGRAPHY N/A 03/03/2017   Procedure: LEFT HEART CATH AND CORONARY ANGIOGRAPHY;  Surgeon: Antonieta Iba, MD;  Location: [facility] INVASIVE CV LAB;  Service: Cardiovascular;  Laterality: N/A;    Current Meds  Medication Sig  . aspirin EC 81 MG tablet Take 1 tablet (81 mg total) by mouth daily.  Marland Kitchen atorvastatin (LIPITOR) 40 MG tablet Take 40 mg by mouth daily.   . carvedilol (COREG) 6.25 MG tablet Take 1 tablet (6.25 mg total) by mouth 2 (two) times daily with a meal.  . citalopram (CELEXA) 40 MG tablet Take 40 mg by mouth daily.  . furosemide (LASIX) 20 MG tablet Take 1 tablet (20 mg total) by mouth daily.  . insulin aspart (NOVOLOG) 100 UNIT/ML injection Inject 15 Units into the skin 3 (three) times daily with meals.  . insulin detemir (LEVEMIR) 100 UNIT/ML injection Inject 20 Units into the skin as needed.  . metFORMIN (GLUCOPHAGE-XR) 500 MG 24 hr tablet Take 500 mg by mouth 2 (two) times daily.  . nitroGLYCERIN (NITROSTAT) 0.4 MG SL tablet Place 1 tablet (0.4  mg total) under the tongue every 5 (five) minutes as needed.  Marland Kitchen omeprazole (PRILOSEC) 20 MG capsule Take 20 mg by mouth 2 (two) times daily before a meal.  . sacubitril-valsartan (ENTRESTO) 24-26 MG Take 1 tablet by mouth 2 (two) times daily.  . traMADol (ULTRAM) 50 MG tablet Take 50 mg by mouth every 8 (eight) hours as needed for pain.    No Known Allergies    Review of Systems negative except from HPI and PMH  Physical Exam BP 132/74 (BP Location: Left Arm, Patient Position: Sitting, Cuff Size: Normal)   Pulse 69   Ht 5\' 10"  (1.778 m)   Wt 233 lb 12 oz (106 kg)   BMI 33.54 kg/m  Well developed and nourished in no acute distress HENT normal Neck supple with JVP-  flat  Clear Regular rate and rhythm, no murmurs or gallops Abd-soft with active BS No Clubbing cyanosis edema  Skin-warm and dry A & Oriented  Grossly normal sensory and motor function  ECG sinus rhythm at 69 with PACs Interval 20/17/44 Left bundle branch block     Assessment and Plan:  Cardiomyopathy ischemic/nonischemic  Congestive heart failure class 2b-3a  Left bundle branch block  Hypertension    Strokes x3  Atrial fibrillation  Anemia   Memory deficits following stroke  LV function has failed to improve despite guideline directed therapy.  Continues with symptoms of class IIb-III a heart failure we have reviewed again CRT/CRT-D implantation.  He would like to proceed.  Have reviewed the potential benefits and risks of ICD implantation including but not limited to death, perforation of heart or lung, lead dislodgement, infection,  device malfunction and inappropriate shocks.  The patient and family express understanding  and are willing to proceed.    Is not taking his Eliquis because of cost.  We will look into Xarelto and Pradaxa as alternative; his thromboembolic risk is very high with a CHADS-VASc score of greater than or equal to 7.  Check hemoglobin within the history of anemia Current medicines are reviewed at length with the patient today .  The patient does not  have concerns regarding medicines.

## 2018-03-17 NOTE — Patient Instructions (Signed)
Medication Instructions:  - Your physician recommends that you continue on your current medications as directed. Please refer to the Current Medication list given to you today.  Please check on the cost of Pradaxa & Xarelto with your insurance  If you need a refill on your cardiac medications before your next appointment, please call your pharmacy.   Lab work: - Your physician recommends that you have lab work today: BMP/ CBC  If you have labs (blood work) drawn today and your tests are completely normal, you will receive your results only by: Marland Kitchen MyChart Message (if you have MyChart) OR . A paper copy in the mail If you have any lab test that is abnormal or we need to change your treatment, we will call you to review the results.  Testing/Procedures: - Your physician has recommended that you have a defibrillator (CRT/ Bi-V device) inserted. An implantable cardioverter defibrillator (ICD) is a small device that is placed in your chest or, in rare cases, your abdomen. This device uses electrical pulses or shocks to help control life-threatening, irregular heartbeats that could lead the heart to suddenly stop beating (sudden cardiac arrest). Leads are attached to the ICD that goes into your heart. This is done in the hospital and usually requires an overnight stay. Please see the instruction sheet given to you today for more information.   Follow-Up: At [facility], you and your health needs are our priority.  As part of our continuing mission to provide you with exceptional heart care, we have created designated Provider Care Teams.  These Care Teams include your primary Cardiologist (physician) and Advanced Practice Providers (APPs -  Physician Assistants and Nurse Practitioners) who all work together to provide you with the care you need, when you need it. . in 10-14 days (from 03/18/18) with the Device Clinic in the [place] office for a wound check appoinment   . In 91 days (from 03/18/18)  with Dr. Graciela Husbands   Any Other Special Instructions Will Be Listed Below (If Applicable). - N/A

## 2018-03-18 ENCOUNTER — Encounter (HOSPITAL_COMMUNITY): Payer: Self-pay | Admitting: *Deleted

## 2018-03-18 ENCOUNTER — Ambulatory Visit (HOSPITAL_COMMUNITY)
Admission: RE | Admit: 2018-03-18 | Discharge: 2018-03-19 | Disposition: A | Discharge status: Home / Self Care | Payer: Medicare Other | Source: Ambulatory Visit | Attending: Internal Medicine | Admitting: Internal Medicine

## 2018-03-18 ENCOUNTER — Ambulatory Visit (HOSPITAL_COMMUNITY)
Admission: RE | Disposition: A | Discharge status: Home / Self Care | Payer: Self-pay | Source: Ambulatory Visit | Attending: Internal Medicine

## 2018-03-18 ENCOUNTER — Other Ambulatory Visit: Payer: Self-pay

## 2018-03-18 DIAGNOSIS — I110 Hypertensive heart disease with heart failure: Secondary | ICD-10-CM | POA: Insufficient documentation

## 2018-03-18 DIAGNOSIS — R0609 Other forms of dyspnea: Secondary | ICD-10-CM | POA: Diagnosis not present

## 2018-03-18 DIAGNOSIS — E119 Type 2 diabetes mellitus without complications: Secondary | ICD-10-CM | POA: Diagnosis not present

## 2018-03-18 DIAGNOSIS — D649 Anemia, unspecified: Secondary | ICD-10-CM | POA: Diagnosis not present

## 2018-03-18 DIAGNOSIS — I5022 Chronic systolic (congestive) heart failure: Secondary | ICD-10-CM | POA: Diagnosis not present

## 2018-03-18 DIAGNOSIS — I2510 Atherosclerotic heart disease of native coronary artery without angina pectoris: Secondary | ICD-10-CM | POA: Insufficient documentation

## 2018-03-18 DIAGNOSIS — Z006 Encounter for examination for normal comparison and control in clinical research program: Secondary | ICD-10-CM | POA: Diagnosis not present

## 2018-03-18 DIAGNOSIS — Z79899 Other long term (current) drug therapy: Secondary | ICD-10-CM | POA: Insufficient documentation

## 2018-03-18 DIAGNOSIS — Z794 Long term (current) use of insulin: Secondary | ICD-10-CM | POA: Diagnosis not present

## 2018-03-18 DIAGNOSIS — I509 Heart failure, unspecified: Secondary | ICD-10-CM

## 2018-03-18 DIAGNOSIS — Z959 Presence of cardiac and vascular implant and graft, unspecified: Secondary | ICD-10-CM

## 2018-03-18 DIAGNOSIS — I447 Left bundle-branch block, unspecified: Secondary | ICD-10-CM | POA: Diagnosis not present

## 2018-03-18 DIAGNOSIS — I4891 Unspecified atrial fibrillation: Secondary | ICD-10-CM | POA: Insufficient documentation

## 2018-03-18 DIAGNOSIS — E785 Hyperlipidemia, unspecified: Secondary | ICD-10-CM | POA: Diagnosis not present

## 2018-03-18 DIAGNOSIS — Z8673 Personal history of transient ischemic attack (TIA), and cerebral infarction without residual deficits: Secondary | ICD-10-CM | POA: Diagnosis not present

## 2018-03-18 DIAGNOSIS — I255 Ischemic cardiomyopathy: Secondary | ICD-10-CM | POA: Diagnosis not present

## 2018-03-18 DIAGNOSIS — I252 Old myocardial infarction: Secondary | ICD-10-CM | POA: Insufficient documentation

## 2018-03-18 DIAGNOSIS — Z6833 Body mass index (BMI) 33.0-33.9, adult: Secondary | ICD-10-CM | POA: Insufficient documentation

## 2018-03-18 DIAGNOSIS — Z7982 Long term (current) use of aspirin: Secondary | ICD-10-CM | POA: Diagnosis not present

## 2018-03-18 HISTORY — PX: BIV ICD INSERTION CRT-D: EP1195

## 2018-03-18 LAB — CBC WITH DIFFERENTIAL/PLATELET
Basophils Absolute: 0 10*3/uL (ref 0.0–0.2)
Basos: 1 %
EOS (ABSOLUTE): 0.2 10*3/uL (ref 0.0–0.4)
EOS: 3 %
HEMATOCRIT: 39.1 % (ref 37.5–51.0)
Hemoglobin: 13.8 g/dL (ref 13.0–17.7)
Immature Grans (Abs): 0 10*3/uL (ref 0.0–0.1)
Immature Granulocytes: 0 %
Lymphocytes Absolute: 1.9 10*3/uL (ref 0.7–3.1)
Lymphs: 23 %
MCH: 32.5 pg (ref 26.6–33.0)
MCHC: 35.3 g/dL (ref 31.5–35.7)
MCV: 92 fL (ref 79–97)
MONOCYTES: 7 %
Monocytes Absolute: 0.6 10*3/uL (ref 0.1–0.9)
Neutrophils Absolute: 5.7 10*3/uL (ref 1.4–7.0)
Neutrophils: 66 %
Platelets: 210 10*3/uL (ref 150–450)
RBC: 4.25 x10E6/uL (ref 4.14–5.80)
RDW: 12.8 % (ref 11.6–15.4)
WBC: 8.5 10*3/uL (ref 3.4–10.8)

## 2018-03-18 LAB — GLUCOSE, CAPILLARY
Glucose-Capillary: 185 mg/dL — ABNORMAL HIGH (ref 70–99)
Glucose-Capillary: 161 mg/dL — ABNORMAL HIGH (ref 70–99)
Glucose-Capillary: 213 mg/dL — ABNORMAL HIGH (ref 70–99)

## 2018-03-18 LAB — BASIC METABOLIC PANEL
BUN/Creatinine Ratio: 21 (ref 10–24)
BUN: 21 mg/dL (ref 8–27)
CO2: 18 mmol/L — ABNORMAL LOW (ref 20–29)
Calcium: 9 mg/dL (ref 8.6–10.2)
Chloride: 105 mmol/L (ref 96–106)
Creatinine, Ser: 1 mg/dL (ref 0.76–1.27)
GFR calc Af Amer: 89 mL/min/{1.73_m2} (ref >59)
GFR calc non Af Amer: 77 mL/min/{1.73_m2} (ref >59)
Glucose: 241 mg/dL — ABNORMAL HIGH (ref 65–99)
Potassium: 4.6 mmol/L (ref 3.5–5.2)
Sodium: 140 mmol/L (ref 134–144)

## 2018-03-18 SURGERY — BIV ICD INSERTION CRT-D

## 2018-03-18 MED ORDER — CITALOPRAM HYDROBROMIDE 20 MG PO TABS
40.0000 mg | ORAL_TABLET | Freq: Every day | ORAL | Status: DC
  Administered 2018-03-19: 40 mg via ORAL
  Filled 2018-03-18 (×2): qty 2

## 2018-03-18 MED ORDER — ACETAMINOPHEN 325 MG PO TABS: 325.0000 mg | ORAL_TABLET | ORAL | Status: DC | PRN

## 2018-03-18 MED ORDER — ASPIRIN EC 81 MG PO TBEC
81.0000 mg | DELAYED_RELEASE_TABLET | Freq: Every day | ORAL | Status: DC
  Administered 2018-03-18 – 2018-03-19 (×2): 81 mg via ORAL
  Filled 2018-03-18 (×2): qty 1

## 2018-03-18 MED ORDER — LIDOCAINE HCL (PF) 1 % IJ SOLN
INTRAMUSCULAR | Status: AC
  Filled 2018-03-18: qty 60

## 2018-03-18 MED ORDER — ONDANSETRON HCL 4 MG/2ML IJ SOLN: 4.0000 mg | Freq: Four times a day (QID) | INTRAMUSCULAR | Status: DC | PRN

## 2018-03-18 MED ORDER — FENTANYL CITRATE (PF) 100 MCG/2ML IJ SOLN
INTRAMUSCULAR | Status: AC
  Filled 2018-03-18: qty 2

## 2018-03-18 MED ORDER — FUROSEMIDE 20 MG PO TABS
20.0000 mg | ORAL_TABLET | Freq: Every day | ORAL | Status: DC
  Administered 2018-03-18 – 2018-03-19 (×2): 20 mg via ORAL
  Filled 2018-03-18 (×2): qty 1

## 2018-03-18 MED ORDER — SODIUM CHLORIDE 0.9 % IV SOLN
80.0000 mg | INTRAVENOUS | Status: AC
  Administered 2018-03-18: 80 mg

## 2018-03-18 MED ORDER — CEFAZOLIN SODIUM-DEXTROSE 2-4 GM/100ML-% IV SOLN
2.0000 g | INTRAVENOUS | Status: AC
  Administered 2018-03-18: 2 g via INTRAVENOUS

## 2018-03-18 MED ORDER — FENTANYL CITRATE (PF) 100 MCG/2ML IJ SOLN
INTRAMUSCULAR | Status: DC | PRN
  Administered 2018-03-18 (×2): 25 ug via INTRAVENOUS
  Administered 2018-03-18: 50 ug via INTRAVENOUS
  Administered 2018-03-18: 25 ug via INTRAVENOUS

## 2018-03-18 MED ORDER — INSULIN ASPART 100 UNIT/ML ~~LOC~~ SOLN
15.0000 [IU] | Freq: Three times a day (TID) | SUBCUTANEOUS | Status: DC
  Administered 2018-03-18 – 2018-03-19 (×2): 15 [IU] via SUBCUTANEOUS

## 2018-03-18 MED ORDER — MIDAZOLAM HCL 5 MG/5ML IJ SOLN
INTRAMUSCULAR | Status: AC
  Filled 2018-03-18: qty 5

## 2018-03-18 MED ORDER — PANTOPRAZOLE SODIUM 40 MG PO TBEC
40.0000 mg | DELAYED_RELEASE_TABLET | Freq: Every day | ORAL | Status: DC
  Administered 2018-03-18 – 2018-03-19 (×2): 40 mg via ORAL
  Filled 2018-03-18 (×2): qty 1

## 2018-03-18 MED ORDER — SODIUM CHLORIDE 0.9 % IV SOLN
INTRAVENOUS | Status: AC
  Filled 2018-03-18: qty 2

## 2018-03-18 MED ORDER — METFORMIN HCL ER 500 MG PO TB24
500.0000 mg | ORAL_TABLET | Freq: Two times a day (BID) | ORAL | Status: DC
  Administered 2018-03-18 – 2018-03-19 (×2): 500 mg via ORAL
  Filled 2018-03-18 (×2): qty 1

## 2018-03-18 MED ORDER — SODIUM CHLORIDE 0.9 % IV SOLN
INTRAVENOUS | Status: DC
  Administered 2018-03-18: 12:00:00 via INTRAVENOUS

## 2018-03-18 MED ORDER — SACUBITRIL-VALSARTAN 24-26 MG PO TABS
1.0000 | ORAL_TABLET | Freq: Two times a day (BID) | ORAL | Status: DC
  Administered 2018-03-18 – 2018-03-19 (×2): 1 via ORAL
  Filled 2018-03-18 (×2): qty 1

## 2018-03-18 MED ORDER — SODIUM CHLORIDE 0.9 % IV SOLN
INTRAVENOUS | Status: AC
  Administered 2018-03-18: 17:00:00 via INTRAVENOUS

## 2018-03-18 MED ORDER — TRAMADOL HCL 50 MG PO TABS
50.0000 mg | ORAL_TABLET | Freq: Three times a day (TID) | ORAL | Status: DC | PRN
  Administered 2018-03-19: 50 mg via ORAL
  Filled 2018-03-18: qty 1

## 2018-03-18 MED ORDER — YOU HAVE A PACEMAKER BOOK
Freq: Once | Status: AC
  Administered 2018-03-18: 1

## 2018-03-18 MED ORDER — SPIRONOLACTONE 12.5 MG HALF TABLET
12.5000 mg | ORAL_TABLET | Freq: Every day | ORAL | Status: DC
  Administered 2018-03-18 – 2018-03-19 (×2): 12.5 mg via ORAL
  Filled 2018-03-18 (×2): qty 1

## 2018-03-18 MED ORDER — CEFAZOLIN SODIUM-DEXTROSE 1-4 GM/50ML-% IV SOLN
1.0000 g | Freq: Four times a day (QID) | INTRAVENOUS | Status: AC
  Administered 2018-03-18 – 2018-03-19 (×3): 1 g via INTRAVENOUS
  Filled 2018-03-18 (×3): qty 50

## 2018-03-18 MED ORDER — LIDOCAINE HCL (PF) 1 % IJ SOLN
INTRAMUSCULAR | Status: DC | PRN
  Administered 2018-03-18: 20 mL
  Administered 2018-03-18: 40 mL

## 2018-03-18 MED ORDER — ATORVASTATIN CALCIUM 40 MG PO TABS
40.0000 mg | ORAL_TABLET | Freq: Every day | ORAL | Status: DC
  Administered 2018-03-18 – 2018-03-19 (×2): 40 mg via ORAL
  Filled 2018-03-18 (×2): qty 1

## 2018-03-18 MED ORDER — CARVEDILOL 6.25 MG PO TABS
6.2500 mg | ORAL_TABLET | Freq: Two times a day (BID) | ORAL | Status: DC
  Administered 2018-03-18 – 2018-03-19 (×2): 6.25 mg via ORAL
  Filled 2018-03-18 (×2): qty 1

## 2018-03-18 MED ORDER — CEFAZOLIN SODIUM-DEXTROSE 2-4 GM/100ML-% IV SOLN
INTRAVENOUS | Status: AC
  Filled 2018-03-18: qty 100

## 2018-03-18 MED ORDER — MIDAZOLAM HCL 5 MG/5ML IJ SOLN
INTRAMUSCULAR | Status: DC | PRN
  Administered 2018-03-18: 2 mg via INTRAVENOUS
  Administered 2018-03-18 (×3): 1 mg via INTRAVENOUS

## 2018-03-18 MED ORDER — MUPIROCIN 2 % EX OINT
TOPICAL_OINTMENT | CUTANEOUS | Status: AC
  Administered 2018-03-18: 1 via NASAL
  Filled 2018-03-18: qty 22

## 2018-03-18 MED ORDER — CHLORHEXIDINE GLUCONATE 4 % EX LIQD: 60.0000 mL | Freq: Once | CUTANEOUS | Status: DC

## 2018-03-18 MED ORDER — ACETAMINOPHEN 500 MG PO TABS
1000.0000 mg | ORAL_TABLET | Freq: Two times a day (BID) | ORAL | Status: DC
  Administered 2018-03-18 – 2018-03-19 (×2): 1000 mg via ORAL
  Filled 2018-03-18 (×2): qty 2

## 2018-03-18 MED ORDER — IOHEXOL 350 MG/ML SOLN
INTRAVENOUS | Status: DC | PRN
  Administered 2018-03-18: 50 mL via INTRAVENOUS

## 2018-03-18 MED ORDER — HEPARIN (PORCINE) IN NACL 1000-0.9 UT/500ML-% IV SOLN
INTRAVENOUS | Status: AC
  Filled 2018-03-18: qty 500

## 2018-03-18 MED ORDER — MUPIROCIN 2 % EX OINT: 1.0000 "application " | TOPICAL_OINTMENT | Freq: Once | CUTANEOUS | Status: DC

## 2018-03-18 SURGICAL SUPPLY — 15 items
CABLE SURGICAL S-101-97-12 (CABLE) ×2 IMPLANT
CATH CPS DIRECT 135 DS2C020 (CATHETERS) ×2 IMPLANT
HEMOSTAT SURGICEL 2X4 FIBR (HEMOSTASIS) ×2 IMPLANT
ICD CLARIA MRI DTMA1D4 (ICD Generator) ×2 IMPLANT
LEAD ATTAIN ABILITY 4396-88CM (Lead) ×2 IMPLANT
LEAD CAPSURE NOVUS 5076-52CM (Lead) ×2 IMPLANT
LEAD SPRINT QUAT SEC 6935M-62 (Lead) ×2 IMPLANT
PAD PRO RADIOLUCENT 2001M-C (PAD) ×2 IMPLANT
SHEATH CLASSIC 7F (SHEATH) ×2 IMPLANT
SHEATH CLASSIC 9.5F (SHEATH) ×2 IMPLANT
SHEATH CLASSIC 9F (SHEATH) ×2 IMPLANT
SLITTER UNIVERSAL DS2A003 (MISCELLANEOUS) ×2 IMPLANT
TRAY PACEMAKER INSERTION (PACKS) ×2 IMPLANT
WIRE ACUITY WHISPER EDS 4648 (WIRE) ×4 IMPLANT
WIRE HI TORQ VERSACORE-J 145CM (WIRE) ×2 IMPLANT

## 2018-03-18 NOTE — Interval H&P Note (Signed)
ICD Criteria  Current LVEF:33%. Within 12 months prior to implant: Yes   Heart failure history: Yes, Class III  Cardiomyopathy history: Yes, Ischemic Cardiomyopathy - Prior MI.  Atrial Fibrillation/Atrial Flutter: Yes, Paroxysmal.  Ventricular tachycardia history: No.  Cardiac arrest history: No.  History of syndromes with risk of sudden death: No.  Previous ICD: No.  Current ICD indication: Primary  PPM indication: No.  Class I or II Bradycardia indication present: No  Beta Blocker therapy for 3 or more months: Yes, prescribed.   Ace Inhibitor/ARB therapy for 3 or more months: No, medical reason.   I have seen Adam Douglas is a 68 y.o. malepre-procedural and has been referred by TG for consideration of ICD implant for primary prevention of sudden death.  The patient's chart has been reviewed and they meet criteria for ICD implant.  I have had a thorough discussion with the patient reviewing options.  The patient and their family (if available) have had opportunities to ask questions and have them answered. The patient and I have decided together through the [facility] Heart Care Share Decision Support Tool to implant CRT- ICD at this time.  Risks, benefits, alternatives to ICD implantation were discussed in detail with the patient today. The patient  understands that the risks include but are not limited to bleeding, infection, pneumothorax, perforation, tamponade, vascular damage, renal failure, MI, stroke, death, inappropriate shocks, and lead dislodgement and   wishes to proceed.  History and Physical Interval Note:  03/18/2018 3:01 PM  Adam Douglas  has presented today for surgery, with the diagnosis of cardiomyopathy.  The various methods of treatment have been discussed with the patient and family. After consideration of risks, benefits and other options for treatment, the patient has consented to  Procedure(s): BIV ICD INSERTION CRT-D (N/A) as a surgical intervention.  The  patient's history has been reviewed, patient examined, no change in status, stable for surgery.  I have reviewed the patient's chart and labs.  Questions were answered to the patient's satisfaction.     Sherryl Manges

## 2018-03-18 NOTE — Progress Notes (Signed)
Orthopedic Tech Progress Note Patient Details:  Adam Douglas 04-May-1950 [id number] RN said patient has on arm sling Patient ID: Adam Douglas, male   DOB: [birth year], 68 y.o.   MRN: [id number]   Adam Douglas 03/18/2018, 5:37 PM

## 2018-03-19 ENCOUNTER — Ambulatory Visit (HOSPITAL_COMMUNITY): Payer: Medicare Other

## 2018-03-19 DIAGNOSIS — I110 Hypertensive heart disease with heart failure: Secondary | ICD-10-CM | POA: Diagnosis not present

## 2018-03-19 DIAGNOSIS — I5022 Chronic systolic (congestive) heart failure: Secondary | ICD-10-CM | POA: Diagnosis not present

## 2018-03-19 DIAGNOSIS — I255 Ischemic cardiomyopathy: Secondary | ICD-10-CM | POA: Diagnosis not present

## 2018-03-19 DIAGNOSIS — Z006 Encounter for examination for normal comparison and control in clinical research program: Secondary | ICD-10-CM | POA: Diagnosis not present

## 2018-03-19 LAB — GLUCOSE, CAPILLARY: GLUCOSE-CAPILLARY: 160 mg/dL — AB (ref 70–99)

## 2018-03-19 NOTE — Discharge Summary (Signed)
ELECTROPHYSIOLOGY PROCEDURE DISCHARGE SUMMARY    Patient ID: Adam Douglas,  MRN: [id number], DOB/AGE: 13-Feb-1950 68 y.o.  Admit date: 03/18/2018 Discharge date: 03/19/2018  Primary Care Physician: Etheleen Nicks, NP Primary Cardiologist: Dr. Mariah Milling Electrophysiologist: Dr. Graciela Husbands  Primary Discharge Diagnosis:  Ischemic Cardiomyopathy  Secondary Discharge Diagnosis:  Chronic Systolic CHF CAD HTN HLD Type 2 DM  No Known Allergies   Procedures This Admission:  1.  Implantation of a dual-chamber ICD on 03/18/2018 by Dr. Graciela Husbands.  The patient received a Medtronic MRI compatible ICD, serial number [id number] with Medtronic MRI compatible single coil active fixation defibrillator lead, model 6935 serial number [id number] and a [employer] Jude 135 cs SHEATH AND Medtronic MRI compatible 5076 active fixation atrial lead, serial number [id number]. A high lateral branch was identified and it was too small to hold a [facility] 1258-lead and so a Medtronic 4396 bipolar lead serial number [id number] was used. There were no immediate post procedure complications.  2.  CXR on 3/14 demonstrated no pneumothorax status post device implantation.   Brief HPI: Adam Douglas is a 68 y.o. male was referred to electrophysiology in the outpatient setting for consideration of ICD implantation.  Past medical history includes ischemic cardiomyopathy (EF 30-35%), chronic systolic CHF, CAD, HTN, HLD, Type 2 DM, and prior CVA.  The patient has persistent LV dysfunction despite guideline directed therapy.  Risks, benefits, and alternatives to ICD implantation were reviewed with the patient who wished to proceed.   Hospital Course:  The patient was admitted and underwent implantation of an ICD with details as outlined above. He was monitored on telemetry overnight which demonstrated P-synchronous/ AV pacing. Left chest was without hematoma or ecchymosis.  The device was interrogated and found to be functioning  normally. CXR was obtained and demonstrated no pneumothorax status post device implantation.  Wound care, arm mobility, and restrictions were reviewed with the patient.  The patient was examined and considered stable for discharge to home.   The patient's discharge medications include Coreg, Lasix, Entresto, and Spironolactone.   Physical Exam: Vitals:   03/18/18 1722 03/18/18 2000 03/18/18 2101 03/19/18 0529  BP: 137/74  (!) 146/88 (!) 166/86  Pulse: 75 72 71 76  Resp: 18  18 18   Temp: 97.8 F (36.6 C)  98.1 F (36.7 C) 98.1 F (36.7 C)  TempSrc: Oral  Oral Oral  SpO2: 95% 95% 95% 95%  Weight: 105.7 kg   105.1 kg  Height: 5\' 10"  (1.778 m)       GEN- The patient is well appearing, alert and oriented x 3 today.   HEENT: normocephalic, atraumatic; sclera clear, conjunctiva pink; hearing intact; oropharynx clear; neck supple, no JVP Lymph- no cervical lymphadenopathy Lungs- Clear to ausculation bilaterally, normal work of breathing.  No wheezes, rales, rhonchi Heart- Regular rate and rhythm, no murmurs, rubs or gallops, PMI not laterally displaced GI- soft, non-tender, non-distended, bowel sounds present, no hepatosplenomegaly Extremities- no clubbing, cyanosis, or edema; DP/PT/radial pulses 2+ bilaterally MS- no significant deformity or atrophy Skin- warm and dry, no rash or lesion, left chest without hematoma/ecchymosis Psych- euthymic mood, full affect Neuro- strength and sensation are intact   Labs:   Lab Results  Component Value Date   WBC 8.5 03/17/2018   HGB 13.8 03/17/2018   HCT 39.1 03/17/2018   MCV 92 03/17/2018   PLT 210 03/17/2018    Recent Labs  Lab 03/17/18 1235  NA 140  K 4.6  CL 105  CO2 18*  BUN 21  CREATININE 1.00  CALCIUM 9.0  GLUCOSE 241*    Discharge Medications:  Allergies as of 03/19/2018   No Known Allergies     Medication List    TAKE these medications   aspirin EC 81 MG tablet Take 1 tablet (81 mg total) by mouth daily.    atorvastatin 40 MG tablet Commonly known as:  LIPITOR Take 40 mg by mouth daily.   carvedilol 6.25 MG tablet Commonly known as:  COREG Take 1 tablet (6.25 mg total) by mouth 2 (two) times daily with a meal.   citalopram 40 MG tablet Commonly known as:  CELEXA Take 40 mg by mouth daily.   furosemide 20 MG tablet Commonly known as:  LASIX Take 1 tablet (20 mg total) by mouth daily.   insulin aspart 100 UNIT/ML injection Commonly known as:  novoLOG Inject 15 Units into the skin 3 (three) times daily with meals.   insulin detemir 100 UNIT/ML injection Commonly known as:  LEVEMIR Inject 20 Units into the skin as needed.   metFORMIN 500 MG 24 hr tablet Commonly known as:  GLUCOPHAGE-XR Take 500 mg by mouth 2 (two) times daily.   nitroGLYCERIN 0.4 MG SL tablet Commonly known as:  NITROSTAT Place 1 tablet (0.4 mg total) under the tongue every 5 (five) minutes as needed.   omeprazole 20 MG capsule Commonly known as:  PRILOSEC Take 20 mg by mouth 2 (two) times daily before a meal.   sacubitril-valsartan 24-26 MG Commonly known as:  ENTRESTO Take 1 tablet by mouth 2 (two) times daily.   spironolactone 25 MG tablet Commonly known as:  ALDACTONE Take 0.5 tablets (12.5 mg total) by mouth daily.   traMADol 50 MG tablet Commonly known as:  ULTRAM Take 50 mg by mouth every 8 (eight) hours as needed for pain.   Tylenol 8 Hour Arthritis Pain 650 MG CR tablet Generic drug:  acetaminophen Take 1,300 mg by mouth 2 (two) times daily.       Disposition:   Follow-up Information    Duke Salvia, MD Follow up on 03/31/2018.   Specialty:  Cardiology Why:  Appointment for Wound Check only on 03/31/2018 at 11:00AM.  Contact information: [address] [place] [address] [phone]           Duration of Discharge Encounter: Greater than 30 minutes including physician time.  Signed, Ellsworth Lennox, PA-C 03/19/2018, 11:31 AM Pager: [phone]

## 2018-03-19 NOTE — Progress Notes (Signed)
Progress Note  Patient Name: Adam Douglas Date of Encounter: 03/19/2018  Primary Cardiologist: TG  Primary Electrophysiologist: SK   Patient Profile     68 y.o. male  Admitted for CRT -D 3/13  Subjective   Without chest pain or shortness of breath  Site without discomfort   Inpatient Medications    Scheduled Meds: . acetaminophen  1,000 mg Oral BID  . aspirin EC  81 mg Oral Daily  . atorvastatin  40 mg Oral Daily  . carvedilol  6.25 mg Oral BID WC  . citalopram  40 mg Oral Daily  . furosemide  20 mg Oral Daily  . insulin aspart  15 Units Subcutaneous TID WC  . metFORMIN  500 mg Oral BID WC  . mupirocin ointment  1 application Topical Once  . pantoprazole  40 mg Oral Daily  . sacubitril-valsartan  1 tablet Oral BID  . spironolactone  12.5 mg Oral Daily   Continuous Infusions: .  ceFAZolin (ANCEF) IV 1 g (03/19/18 0251)   PRN Meds: acetaminophen, ondansetron (ZOFRAN) IV, traMADol   Vital Signs    Vitals:   03/18/18 1722 03/18/18 2000 03/18/18 2101 03/19/18 0529  BP: 137/74  (!) 146/88 (!) 166/86  Pulse: 75 72 71 76  Resp: 18  18 18   Temp: 97.8 F (36.6 C)  98.1 F (36.7 C) 98.1 F (36.7 C)  TempSrc: Oral  Oral Oral  SpO2: 95% 95% 95% 95%  Weight: 105.7 kg   105.1 kg  Height: 5\' 10"  (1.778 m)       Intake/Output Summary (Last 24 hours) at 03/19/2018 0837 Last data filed at 03/19/2018 0615 Gross per 24 hour  Intake 805.66 ml  Output 1600 ml  Net -794.34 ml   Filed Weights   03/18/18 1103 03/18/18 1722 03/19/18 0529  Weight: 104.3 kg 105.7 kg 105.1 kg    Telemetry    P-synchronous/ AV  pacing Nonsustained VT present   - Personally Reviewed  ECG    P-synchronous/ AV  pacing - Personally Reviewed qR lead 1 and rS lead V1  QRSd 170>>140s Physical Exam    GEN: No acute distress.   Neck: JVD  flat Pocket withou hematoma, swelling or tenderness  Cardiac: RRR, no  murmurs, rubs, or gallops.  Respiratory: Clear to auscultation bilaterally. GI:  Soft, nontender, non-distended  MS:   edema; No deformity. Neuro:  Nonfocal  Psych: Normal affect  Skin Warm and dry   Labs    Chemistry Recent Labs  Lab 03/17/18 1235  NA 140  K 4.6  CL 105  CO2 18*  GLUCOSE 241*  BUN 21  CREATININE 1.00  CALCIUM 9.0  GFRNONAA 77  GFRAA 89     Hematology Recent Labs  Lab 03/17/18 1235  WBC 8.5  RBC 4.25  HGB 13.8  HCT 39.1  MCV 92  MCH 32.5  MCHC 35.3  RDW 12.8  PLT 210    Cardiac EnzymesNo results for input(s): TROPONINI in the last 168 hours. No results for input(s): TROPIPOC in the last 168 hours.   BNPNo results for input(s): BNP, PROBNP in the last 168 hours.   DDimer No results for input(s): DDIMER in the last 168 hours.   Radiology    Dg Chest 2 View  Result Date: 03/19/2018 CLINICAL DATA:  Post pacemaker, arm soreness EXAM: CHEST - 2 VIEW COMPARISON:  02/24/2017 FINDINGS: Lungs are clear.  No pleural effusion or pneumothorax. The heart is normal in size. Left subclavian ICD in  satisfactory position. IMPRESSION: Left subclavian ICD in satisfactory position. Electronically Signed   By: Charline Bills M.D.   On: 03/19/2018 06:53   Chest Xray personally reviewed   Cardiac Studies      Device Interrogation    Normal device function   Assessment & Plan    Cardiomyopathy ischemic/nonischemic  Congestive heart failure class 2b-3a  Left bundle branch block  Hypertension    Strokes x3  Atrial fibrillation  CRT-D Medtronic implant   Discharge on prior meds Wound check  Followup 91 days  Instructions given   Signed, Sherryl Manges, MD  03/19/2018, 8:37 AM

## 2018-03-19 NOTE — Discharge Instructions (Signed)
° ° °  Supplemental Discharge Instructions for  Pacemaker/Defibrillator Patients  Activity No heavy lifting or vigorous activity with your left/right arm for 6 to 8 weeks.  Do not raise your left/right arm above your head for one week.  Gradually raise your affected arm as drawn below.           _         3/17                            3/18                         3/19                      3/20 _  NO DRIVING for  1 week   ; you may begin driving on  4/19   .  WOUND CARE - Keep the wound area clean and dry.  Do not get this area wet for one week. No showers for one week; you may shower on  3/20   . - The tape/steri-strips on your wound will fall off; do not pull them off.  No bandage is needed on the site.  DO  NOT apply any creams, oils, or ointments to the wound area. - If you notice any drainage or discharge from the wound, any swelling or bruising at the site, or you develop a fever > 101? F after you are discharged home, call the office at once.  Special Instructions - You are still able to use cellular telephones; use the ear opposite the side where you have your pacemaker/defibrillator.  Avoid carrying your cellular phone near your device. - When traveling through airports, show security personnel your identification card to avoid being screened in the metal detectors.  Ask the security personnel to use the hand wand. - Avoid arc welding equipment, MRI testing (magnetic resonance imaging), TENS units (transcutaneous nerve stimulators).  Call the office for questions about other devices. - Avoid electrical appliances that are in poor condition or are not properly grounded. - Microwave ovens are safe to be near or to operate.  Additional information for defibrillator patients should your device go off: - If your device goes off ONCE and you feel fine afterward, notify the device clinic nurses. - If your device goes off ONCE and you do not feel well afterward, call 911. - If your device  goes off TWICE, call 911. - If your device goes off THREE times in one day, call 911.  DO NOT DRIVE YOURSELF OR A FAMILY MEMBER WITH A DEFIBRILLATOR TO THE HOSPITAL--CALL 911.

## 2018-03-21 ENCOUNTER — Encounter (HOSPITAL_COMMUNITY): Payer: Self-pay | Admitting: Internal Medicine

## 2018-03-21 MED FILL — Gentamicin Sulfate Inj 40 MG/ML: INTRAMUSCULAR | Qty: 80 | Status: AC

## 2018-03-31 ENCOUNTER — Ambulatory Visit: Payer: Medicare Other

## 2018-04-15 ENCOUNTER — Telehealth: Payer: Self-pay | Admitting: Cardiovascular Disease

## 2018-04-15 DIAGNOSIS — Z5181 Encounter for therapeutic drug level monitoring: Secondary | ICD-10-CM

## 2018-04-15 DIAGNOSIS — I480 Paroxysmal atrial fibrillation: Secondary | ICD-10-CM

## 2018-04-15 NOTE — Telephone Encounter (Signed)
DPR on file. Spoke with the pt wife. The pt has not been on anti-coag due to cost. They are on a fixed income and not able to afford the $145 a month for Eliquis.  The pt would like to be started on Coumadin. Adv the pt that it will require regular INR checks and dietary restrictions. Pt wife sts that they agreeable and would like to proceed. Adv them that I will fwd an update to Dr.Klein for approval and call back with his response.

## 2018-04-15 NOTE — Telephone Encounter (Signed)
Patient spouse calling Would like to speak with nurse in regards to Eliquis  Patient will be unable to afford $145 for Eliquis each month and the insurance company will not go over prices of any other medication options until the office calls and speaks with them for approval Please call patient to discuss

## 2018-04-18 NOTE — Telephone Encounter (Signed)
That is fine to start coumadin

## 2018-04-19 NOTE — Telephone Encounter (Signed)
Routing to Coumadin Clinic.

## 2018-04-19 NOTE — Telephone Encounter (Signed)
Adam Douglas, we do not start or stop pt's Warfarin in the Coumadin Clinic, the provider or most of the time the provider's RN will communicate with the pt a start dosage (usually 5mg , but provider should indicate starting dosage) script to be sent in by provider or their RN and then a new pt appointment needs to be made for the pt in the Coumadin Clinic approximately 4-5 days after pt's first dosage of Warfarin. Since the pt has not been taking the Eliquis it doesn't seem like overlap of drugs is involved, so it's just like a new start. In light of COVID-19 I think [name] is checking these pt's in the drive through clinic and then calling them afterwards to dose and educate. Let me know if you have any further questions, or need additional help.  Thanks.

## 2018-04-19 NOTE — Telephone Encounter (Signed)
Dr Graciela Husbands,  Please advise on what dose and then we can set patient up with the coumadin clinic.  Thanks!

## 2018-04-20 MED ORDER — WARFARIN SODIUM 5 MG PO TABS: 5.0000 mg | ORAL_TABLET | Freq: Every day | ORAL | 0 refills | Status: DC

## 2018-04-20 NOTE — Telephone Encounter (Signed)
Spoke w/ pt's wife, as she reports that pt would not be able to understand me. Advised her that Dr. Graciela Husbands recommends pt start coumadin since the Eliquis is so expensive right now.  Provided some education on coumadin, but advised her that I will provide her & pt w/ information packet on Monday when he comes to drive thru. Advised her that I am sending in to [facility] in [place], she will try to pick up tomorrow.  Advised her to have pt start taking 5 mg once daily in the evening until I can check his INR on Monday, 4/20 and adjust his dosage accordingly.  She reports that they try to have greens at least every other day, sometimes daily. Advised her of drive thru at the [facility], for her to pull up to valet parking and I will come out to car to obtain small blood sample. She is appreciative and will call back w/ any questions before that time.

## 2018-04-20 NOTE — Telephone Encounter (Signed)
5 mg daily would be appropriate dose to initiate Thanks

## 2018-04-20 NOTE — Telephone Encounter (Signed)
Discussed with Darla Lesches, RN in [facility]. She asked I route the message to her to manage from here. Thanks!

## 2018-04-25 ENCOUNTER — Ambulatory Visit (INDEPENDENT_AMBULATORY_CARE_PROVIDER_SITE_OTHER): Payer: Medicare Other

## 2018-04-25 DIAGNOSIS — I480 Paroxysmal atrial fibrillation: Secondary | ICD-10-CM | POA: Diagnosis not present

## 2018-04-25 DIAGNOSIS — Z5181 Encounter for therapeutic drug level monitoring: Secondary | ICD-10-CM | POA: Diagnosis not present

## 2018-04-25 LAB — POCT INR: INR: 2.1 (ref 2.0–3.0)

## 2018-04-25 NOTE — Patient Instructions (Signed)
Please continue dosage of 5mg  every day. Recheck in 1 week.

## 2018-04-26 ENCOUNTER — Other Ambulatory Visit: Payer: Self-pay

## 2018-05-02 ENCOUNTER — Telehealth: Payer: Self-pay

## 2018-05-02 NOTE — Telephone Encounter (Signed)
1. Do you currently have a fever? No (yes = cancel and refer to pcp for e-visit) 2. Have you recently travelled on a cruise, internationally, or to [place], [place], [place], [place], [place], or [place], [place] [employer]) ? No (yes = cancel, stay home, monitor symptoms, and contact pcp or initiate e-visit if symptoms develop) 3. Have you been in contact with someone that is currently pending confirmation of Covid19 testing or has been confirmed to have the Covid19 virus?  No (yes = cancel, stay home, away from tested individual, monitor symptoms, and contact pcp or initiate e-visit if symptoms develop) 4. Are you currently experiencing fatigue or cough? No (yes = pt should be prepared to have a mask placed at the time of their visit).  Spoke w/ pt's wife. Advised that we are restricting visitors at this time and anyone present in the vehicle should meet the above criteria as well. Advised that visit will be at curbside for finger stick ONLY and will receive call with instructions. Pt also advised to please bring own pen for signature of arrival document.

## 2018-05-04 ENCOUNTER — Other Ambulatory Visit: Payer: Self-pay

## 2018-05-04 ENCOUNTER — Ambulatory Visit (INDEPENDENT_AMBULATORY_CARE_PROVIDER_SITE_OTHER): Payer: Medicare Other

## 2018-05-04 DIAGNOSIS — I480 Paroxysmal atrial fibrillation: Secondary | ICD-10-CM | POA: Diagnosis not present

## 2018-05-04 DIAGNOSIS — Z5181 Encounter for therapeutic drug level monitoring: Secondary | ICD-10-CM

## 2018-05-04 DIAGNOSIS — Z8673 Personal history of transient ischemic attack (TIA), and cerebral infarction without residual deficits: Secondary | ICD-10-CM

## 2018-05-04 LAB — POCT INR: INR: 3.5 — AB (ref 2.0–3.0)

## 2018-05-04 NOTE — Patient Instructions (Signed)
Since you've already taken your coumadin today, please have a large serving of greens, then START NEW DOSAGE of 5mg  every day EXCEPT 1/2 DOSAGE ON MONDAYS & FRIDAYS. Recheck in 2 weeks.

## 2018-05-16 ENCOUNTER — Telehealth: Payer: Self-pay

## 2018-05-16 NOTE — Telephone Encounter (Signed)
Attempted to contact pt to prescreen for COVID19 prior to INR check @ drive thru on Wed, 3/21. No answer, vm box not set up, unable to leave message.

## 2018-05-18 ENCOUNTER — Ambulatory Visit (INDEPENDENT_AMBULATORY_CARE_PROVIDER_SITE_OTHER): Payer: Medicare Other

## 2018-05-18 ENCOUNTER — Other Ambulatory Visit: Payer: Self-pay

## 2018-05-18 DIAGNOSIS — Z5181 Encounter for therapeutic drug level monitoring: Secondary | ICD-10-CM | POA: Diagnosis not present

## 2018-05-18 DIAGNOSIS — I480 Paroxysmal atrial fibrillation: Secondary | ICD-10-CM

## 2018-05-18 LAB — POCT INR: INR: 1.8 — AB (ref 2.0–3.0)

## 2018-05-18 NOTE — Patient Instructions (Signed)
Please take 2 tablets tonight, then resume dosage of 5 mg every day EXCEPT 1/2 DOSAGE ON MONDAYS & FRIDAYS. Recheck in 2 weeks.

## 2018-05-24 ENCOUNTER — Other Ambulatory Visit: Payer: Self-pay | Admitting: Internal Medicine

## 2018-05-25 ENCOUNTER — Other Ambulatory Visit: Payer: Self-pay

## 2018-05-25 MED ORDER — WARFARIN SODIUM 5 MG PO TABS: 5.0000 mg | ORAL_TABLET | ORAL | 0 refills | Status: DC

## 2018-05-30 ENCOUNTER — Telehealth: Payer: Self-pay

## 2018-05-30 NOTE — Telephone Encounter (Signed)
1. Do you currently have a fever? No 2. Have you recently travelled on a cruise, internationally, or to [place], [place], [place], [place], [place], or [place], [place] ([employer]) ? No 3. Have you been in contact with someone that is currently pending confirmation of Covid19 testing or has been confirmed to have the Covid19 virus?  No 4. Are you currently experiencing fatigue or cough? No  Spoke w/ pt's wife. Advised that we are restricting visitors at this time and anyone present in the vehicle should meet the above criteria as well. Advised that visit will be at curbside for finger stick ONLY and will receive call with instructions. Pt also advised to please bring own pen for signature of arrival document.   

## 2018-06-01 ENCOUNTER — Ambulatory Visit (INDEPENDENT_AMBULATORY_CARE_PROVIDER_SITE_OTHER): Payer: Medicare Other

## 2018-06-01 ENCOUNTER — Other Ambulatory Visit: Payer: Self-pay

## 2018-06-01 DIAGNOSIS — I480 Paroxysmal atrial fibrillation: Secondary | ICD-10-CM | POA: Diagnosis not present

## 2018-06-01 DIAGNOSIS — Z5181 Encounter for therapeutic drug level monitoring: Secondary | ICD-10-CM

## 2018-06-01 LAB — POCT INR: INR: 1.4 — AB (ref 2.0–3.0)

## 2018-06-01 NOTE — Patient Instructions (Signed)
Please resume dosage of 5 mg every day. Recheck in 2 weeks.

## 2018-06-13 ENCOUNTER — Telehealth: Payer: Self-pay

## 2018-06-13 NOTE — Telephone Encounter (Signed)
Attempted to contact pt's wife to prescreen for [place] prior to INR check on Wed 6/10. Left detailed message on vm that I will be seeing pts back in the office and they will need to enter thru the [place] entrance of [facility], they will need to wear a mask and that I am putting her on the list of approved visitors due to pt's mental status.  Asked her to call back w/ any questions or concerns.

## 2018-06-15 ENCOUNTER — Other Ambulatory Visit: Payer: Self-pay

## 2018-06-15 ENCOUNTER — Ambulatory Visit (INDEPENDENT_AMBULATORY_CARE_PROVIDER_SITE_OTHER): Payer: Medicare Other

## 2018-06-15 DIAGNOSIS — I480 Paroxysmal atrial fibrillation: Secondary | ICD-10-CM | POA: Diagnosis not present

## 2018-06-15 DIAGNOSIS — Z5181 Encounter for therapeutic drug level monitoring: Secondary | ICD-10-CM | POA: Diagnosis not present

## 2018-06-15 LAB — POCT INR: INR: 2.5 (ref 2.0–3.0)

## 2018-06-15 NOTE — Patient Instructions (Signed)
Please continue dosage of 5 mg every day. Recheck in 3 weeks.

## 2018-06-16 ENCOUNTER — Telehealth: Payer: Self-pay

## 2018-06-16 NOTE — Telephone Encounter (Signed)
Virtual Visit Pre-Appointment Phone Call   1. Confirm consent - "In the setting of the current Covid19 crisis, you are scheduled for a (phone or video) visit with your provider on (date) at (time).  Just as we do with many in-office visits, in order for you to participate in this visit, we must obtain consent.  If you'd like, I can send this to your mychart (if signed up) or email for you to review.  Otherwise, I can obtain your verbal consent now.  All virtual visits are billed to your insurance company just like a normal visit would be.  By agreeing to a virtual visit, we'd like you to understand that the technology does not allow for your provider to perform an examination, and thus may limit your provider's ability to fully assess your condition. If your provider identifies any concerns that need to be evaluated in person, we will make arrangements to do so.  Finally, though the technology is pretty good, we cannot assure that it will always work on either your or our end, and in the setting of a video visit, we may have to convert it to a phone-only visit.  In either situation, we cannot ensure that we have a secure connection.  Are you willing to proceed?"  2. YES  3. Advise patient to be prepared - "Two hours prior to your appointment, go ahead and check your blood pressure, pulse, oxygen saturation, and your weight (if you have the equipment to check those) and write them all down. When your visit starts, your provider will ask you for this information. If you have an Apple Watch or Kardia device, please plan to have heart rate information ready on the day of your appointment. Please have a pen and paper handy nearby the day of the visit as well."   4. Inform patient they will receive a phone call 15 minutes prior to their appointment time (may be from unknown caller ID) so they should be prepared to answer    TELEPHONE CALL NOTE  Gabriela Giannelli has been deemed a candidate for a  follow-up tele-health visit to limit community exposure during the Covid-19 pandemic. I spoke with the patient via phone to ensure availability of phone/video source, confirm preferred email & phone number, and discuss instructions and expectations.  I reminded Celso Granja to be prepared with any vital sign and/or heart rhythm information that could potentially be obtained via home monitoring, at the time of his visit. I reminded Ulmer Degen to expect a phone call prior to his visit.  Alba Destine, RMA 06/16/2018 3:05 PM    IF USING DOXIMITY or [email or web address] - The patient will receive a link just prior to their visit by text.     FULL LENGTH CONSENT FOR TELE-HEALTH VISIT   I hereby voluntarily request, consent and authorize [facility] and its employed or contracted physicians, physician assistants, nurse practitioners or other licensed health care professionals (the Practitioner), to provide me with telemedicine health care services (the "Services") as deemed necessary by the treating Practitioner. I acknowledge and consent to receive the Services by the Practitioner via telemedicine. I understand that the telemedicine visit will involve communicating with the Practitioner through live audiovisual communication technology and the disclosure of certain medical information by electronic transmission. I acknowledge that I have been given the opportunity to request an in-person assessment or other available alternative prior to the telemedicine visit and am voluntarily participating in the telemedicine visit.  I understand  that I have the right to withhold or withdraw my consent to the use of telemedicine in the course of my care at any time, without affecting my right to future care or treatment, and that the Practitioner or I may terminate the telemedicine visit at any time. I understand that I have the right to inspect all information obtained and/or recorded in the course of the telemedicine  visit and may receive copies of available information for a reasonable fee.  I understand that some of the potential risks of receiving the Services via telemedicine include:  Marland Kitchen Delay or interruption in medical evaluation due to technological equipment failure or disruption; . Information transmitted may not be sufficient (e.g. poor resolution of images) to allow for appropriate medical decision making by the Practitioner; and/or  . In rare instances, security protocols could fail, causing a breach of personal health information.  Furthermore, I acknowledge that it is my responsibility to provide information about my medical history, conditions and care that is complete and accurate to the best of my ability. I acknowledge that Practitioner's advice, recommendations, and/or decision may be based on factors not within their control, such as incomplete or inaccurate data provided by me or distortions of diagnostic images or specimens that may result from electronic transmissions. I understand that the practice of medicine is not an exact science and that Practitioner makes no warranties or guarantees regarding treatment outcomes. I acknowledge that I will receive a copy of this consent concurrently upon execution via email to the email address I last provided but may also request a printed copy by calling the office of [facility].    I understand that my insurance will be billed for this visit.   I have read or had this consent read to me. . I understand the contents of this consent, which adequately explains the benefits and risks of the Services being provided via telemedicine.  . I have been provided ample opportunity to ask questions regarding this consent and the Services and have had my questions answered to my satisfaction. . I give my informed consent for the services to be provided through the use of telemedicine in my medical care  By participating in this telemedicine visit I agree to the  above.

## 2018-06-17 ENCOUNTER — Ambulatory Visit (INDEPENDENT_AMBULATORY_CARE_PROVIDER_SITE_OTHER): Payer: Medicare Other | Admitting: *Deleted

## 2018-06-17 DIAGNOSIS — I255 Ischemic cardiomyopathy: Secondary | ICD-10-CM | POA: Diagnosis not present

## 2018-06-17 LAB — CUP PACEART REMOTE DEVICE CHECK
Battery Remaining Longevity: 110 mo
Battery Voltage: 3.13 V
Brady Statistic AP VP Percent: 16.38 %
Brady Statistic AP VS Percent: 0.25 %
Brady Statistic AS VP Percent: 81.64 %
Brady Statistic AS VS Percent: 1.73 %
Brady Statistic RA Percent Paced: 16.61 %
Brady Statistic RV Percent Paced: 8.73 %
Date Time Interrogation Session: 20200612052303
HighPow Impedance: 59 Ohm
Implantable Lead Implant Date: 20200313
Implantable Lead Implant Date: 20200313
Implantable Lead Implant Date: 20200313
Implantable Lead Location: 753858
Implantable Lead Location: 753859
Implantable Lead Location: 753860
Implantable Lead Model: 4396
Implantable Lead Model: 5076
Implantable Lead Model: 6935
Implantable Pulse Generator Implant Date: 20200313
Lead Channel Impedance Value: 304 Ohm
Lead Channel Impedance Value: 342 Ohm
Lead Channel Impedance Value: 361 Ohm
Lead Channel Impedance Value: 361 Ohm
Lead Channel Impedance Value: 418 Ohm
Lead Channel Impedance Value: 608 Ohm
Lead Channel Pacing Threshold Amplitude: 0.5 V
Lead Channel Pacing Threshold Amplitude: 0.75 V
Lead Channel Pacing Threshold Amplitude: 0.75 V
Lead Channel Pacing Threshold Pulse Width: 0.4 ms
Lead Channel Pacing Threshold Pulse Width: 0.4 ms
Lead Channel Pacing Threshold Pulse Width: 0.4 ms
Lead Channel Sensing Intrinsic Amplitude: 11.25 mV
Lead Channel Sensing Intrinsic Amplitude: 11.25 mV
Lead Channel Sensing Intrinsic Amplitude: 2.375 mV
Lead Channel Sensing Intrinsic Amplitude: 2.375 mV
Lead Channel Setting Pacing Amplitude: 1.25 V
Lead Channel Setting Pacing Amplitude: 3.25 V
Lead Channel Setting Pacing Amplitude: 3.5 V
Lead Channel Setting Pacing Pulse Width: 0.4 ms
Lead Channel Setting Pacing Pulse Width: 0.4 ms
Lead Channel Setting Sensing Sensitivity: 0.3 mV

## 2018-06-20 ENCOUNTER — Encounter: Payer: Self-pay | Admitting: Cardiology

## 2018-06-20 NOTE — Progress Notes (Signed)
Remote ICD transmission.   

## 2018-06-20 NOTE — Progress Notes (Signed)
Electrophysiology TeleHealth Note   Due to national recommendations of social distancing due to COVID 19, an audio/video telehealth visit is felt to be most appropriate for this patient at this time.  See MyChart message from today for the patient's consent to telehealth for [facility].   Date:  06/21/2018   ID:  Adam Douglas, DOB 28-Dec-1950, MRN [id number]  Location: patient's home  Provider location: [address] [place]  Evaluation Performed: Follow-up visit  PCP:  Verita Lamb, NP  Cardiologist:   Tg Electrophysiologist:  SK   Chief Complaint:  CHF CM and ICD  History of Present Illness:    Adam Douglas is a 68 y.o. male who presents via audio/video conferencing for a telehealth visit today. The patient did not have access to video technology/had technical difficulties with video requiring transitioning to audio format only (telephone).  All issues noted in this document were discussed and addressed.  No physical exam could be performed with this format.     Since last being seen in our clinic for consideration of ICD insertion  the patient underwent CRT D implantation 3/20 and reports doing well  The patient denies chest pain, shortness of breath, nocturnal dyspnea, orthopnea or peripheral edema.  There have been no palpitations, lightheadedness or syncope.      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  He has a combined nonischemic/ischemic cardiomyopathy.  He has a history of prior strokes.  Previously on DAPT; at last visit changed to apixaban previously not been taking 2/2 cost    DATE TEST EF   2/19 LHC <20% Severe 3V CAD>> med Rx  2/19 Echo 20-25%   7/19 Echo 30-35%   10/19 Echo  30-35%   3/20 Echo  30-35%    Date Cr K Hgb  3/19 1.26 4.9 11.5  6/19   11.9  10/19 1.10 4.7   3/20 1.0 4.6      The patient denies symptoms of fevers, chills,  cough, or new SOB worrisome for COVID 19. *   Past Medical History:  Diagnosis Date  . CAD (coronary artery disease)    a. 02/2017 NSTEMI/Cath: LM 40d, LAD 54m, 59m/d, D2 small w/ sev prox/m dzs, LCX nl, OM1 sev diff dzs - small vessel, OM2 80-90p/m, RCA dominant 21m, RPDA 50, EF <20%-->Med Rx.  . Diabetes mellitus without complication ([place])   . HFrEF (heart failure with reduced ejection fraction) ([place])    a. 02/2017 Echo: EF 20-25%.  Marland Kitchen Hyperlipidemia   . Hypertension   . Ischemic cardiomyopathy    a. 02/2017 Echo: EF 20-25%, diff HK, Gr2 DD, mild MR.  . LBBB (left bundle branch block)    a. Noted 02/2017. Pt denies any known prior history of LBBB (not present on 2014 [facility] ECG interpretation).  . Morbid obesity ([place])   . Stroke [facility])    a. Ocular strokes x 3 - prev eval in [facility] for first 2, [facility] for last one.    Past Surgical History:  Procedure Laterality Date  . BIV ICD INSERTION CRT-D N/A 03/18/2018   Procedure: BIV ICD INSERTION CRT-D;  Surgeon: Deboraha Sprang, MD;  Location: [place] CV LAB;  Service: Cardiovascular;  Laterality: N/A;  . CERVICAL DISC SURGERY    . CHOLECYSTECTOMY    . LEFT HEART CATH AND CORONARY ANGIOGRAPHY N/A 03/03/2017   Procedure: LEFT HEART CATH AND CORONARY ANGIOGRAPHY;  Surgeon: Ida Rogue  J, MD;  Location: [facility] INVASIVE CV LAB;  Service: Cardiovascular;  Laterality: N/A;    Current Outpatient Medications  Medication Sig Dispense Refill  . acetaminophen (TYLENOL 8 HOUR ARTHRITIS PAIN) 650 MG CR tablet Take 1,300 mg by mouth 2 (two) times daily.    Marland Kitchen aspirin EC 81 MG tablet Take 1 tablet (81 mg total) by mouth daily. 90 tablet 3  . atorvastatin (LIPITOR) 40 MG tablet Take 40 mg by mouth daily.     . carvedilol (COREG) 6.25 MG tablet Take 1 tablet (6.25 mg total) by mouth 2 (two) times daily with a meal. 180 tablet 2  . citalopram (CELEXA) 40 MG tablet Take 40 mg by mouth daily.    . furosemide (LASIX) 20 MG tablet Take 1 tablet (20 mg total)  by mouth daily. 90 tablet 2  . insulin aspart (NOVOLOG) 100 UNIT/ML injection Inject 15 Units into the skin 3 (three) times daily with meals.    . insulin detemir (LEVEMIR) 100 UNIT/ML injection Inject 20 Units into the skin as needed.    . metFORMIN (GLUCOPHAGE-XR) 500 MG 24 hr tablet Take 500 mg by mouth 2 (two) times daily.    . nitroGLYCERIN (NITROSTAT) 0.4 MG SL tablet Place 1 tablet (0.4 mg total) under the tongue every 5 (five) minutes as needed. 25 tablet 2  . omeprazole (PRILOSEC) 20 MG capsule Take 20 mg by mouth 2 (two) times daily before a meal.    . sacubitril-valsartan (ENTRESTO) 24-26 MG Take 1 tablet by mouth 2 (two) times daily.    Marland Kitchen spironolactone (ALDACTONE) 25 MG tablet Take 0.5 tablets (12.5 mg total) by mouth daily. 30 tablet 3  . traMADol (ULTRAM) 50 MG tablet Take 50 mg by mouth every 8 (eight) hours as needed for pain.    Marland Kitchen warfarin (COUMADIN) 5 MG tablet Take 1 tablet (5 mg total) by mouth as directed. Take as directed by the coumadin clinic. 30 tablet 0   No current facility-administered medications for this visit.     Allergies:   Patient has no known allergies.   Social History:  The patient  reports that he has quit smoking. He has a 15.00 pack-year smoking history. He has never used smokeless tobacco. He reports that he does not drink alcohol or use drugs.   Family History:  The patient's   family history includes CAD in his sister; CAD (age of onset: 36) in his father; CAD (age of onset: 58) in his mother; Heart attack in his father and mother.   ROS:  Please see the history of present illness.   All other systems are personally reviewed and negative.    Exam:    Vital Signs:  BP (!) 151/82 (BP Location: Left Arm, Patient Position: Sitting, Cuff Size: Normal)   Pulse 66   Ht 5' 10.5" (1.791 m)   Wt 229 lb (103.9 kg)   BMI 32.39 kg/m     Labs/Other Tests and Data Reviewed:    Recent Labs: 03/17/2018: BUN 21; Creatinine, Ser 1.00; Hemoglobin 13.8;  Platelets 210; Potassium 4.6; Sodium 140   Wt Readings from Last 3 Encounters:  06/21/18 229 lb (103.9 kg)  03/19/18 231 lb 9.6 oz (105.1 kg)  03/17/18 233 lb 12 oz (106 kg)     Other studies personally reviewed: Additional studies/ records that were reviewed today include: As above    Device interrogation  6/20  Normal device function with VT NS   ASSESSMENT & PLAN:    Cardiomyopathy  ischemic/nonischemic  Congestive heart failure class 2b-3a  Left bundle branch block  Hypertension    Strokes x3  Atrial fibrillation  Anemia   CRT-D  Memory deficits following stroke  Doing very well   Irritability may be a manifestation of depression/anxiety which is temporally assoc with COVID and I wonder whether it relates to sheltering in place; not a prior history.  Have encouraged him to get outside in the neighborhood and walk; if symptoms dont improve to follow up with PCP   By report, no CHF,  BP is elevated, and so will increase carvedilol 6.25>>12.5   If BP remains elevated, and if no interval problems with LH ( none as of now) would increase entresto 24/26>>49/51.  Hgb stable  When returns would loke to do echo to reassess LV function following CRT   K was ok in Mar and will need recheck  No intercurrent atrial fibrillation or flutter  Device pocket well healed by wifes report   COVID 19 screen The patient denies symptoms of COVID 19 at this time.  The importance of social distancing was discussed today.  Follow-up:  Pm  TG 3 m   Current medicines are reviewed at length with the patient today.   The patient does not have concerns regarding his medicines.  The following changes were made today:   Increase carvedilol 6.25 >> 12.5  Labs/ tests ordered today include:   No orders of the defined types were placed in this encounter.   Future tests ( post COVID )    Patient Risk:  after full review of this patients clinical status, I feel that they are at  moderate  risk at this time.  Today, I have spent  12 minutes with the patient with telehealth technology discussing the above.  Signed, Sherryl MangesSteven Javante Nilsson, MD  06/21/2018 8:27 AM     [facility] [address] [place] [address] [phone] (office) [phone] (fax)

## 2018-06-21 ENCOUNTER — Telehealth (INDEPENDENT_AMBULATORY_CARE_PROVIDER_SITE_OTHER): Payer: Medicare Other | Admitting: Internal Medicine

## 2018-06-21 ENCOUNTER — Other Ambulatory Visit: Payer: Self-pay

## 2018-06-21 VITALS — BP 151/82 | HR 66 | Ht 70.5 in | Wt 229.0 lb

## 2018-06-21 DIAGNOSIS — I110 Hypertensive heart disease with heart failure: Secondary | ICD-10-CM

## 2018-06-21 DIAGNOSIS — I255 Ischemic cardiomyopathy: Secondary | ICD-10-CM

## 2018-06-21 DIAGNOSIS — I10 Essential (primary) hypertension: Secondary | ICD-10-CM

## 2018-06-21 DIAGNOSIS — I447 Left bundle-branch block, unspecified: Secondary | ICD-10-CM

## 2018-06-21 DIAGNOSIS — Z95810 Presence of automatic (implantable) cardiac defibrillator: Secondary | ICD-10-CM

## 2018-06-21 DIAGNOSIS — I5022 Chronic systolic (congestive) heart failure: Secondary | ICD-10-CM | POA: Diagnosis not present

## 2018-06-21 MED ORDER — CARVEDILOL 12.5 MG PO TABS: 12.5000 mg | ORAL_TABLET | Freq: Two times a day (BID) | ORAL | 3 refills | Status: DC

## 2018-06-21 MED ORDER — SACUBITRIL-VALSARTAN 24-26 MG PO TABS: 1.0000 | ORAL_TABLET | Freq: Two times a day (BID) | ORAL | 3 refills | Status: DC

## 2018-06-21 MED ORDER — SPIRONOLACTONE 25 MG PO TABS: 12.5000 mg | ORAL_TABLET | Freq: Every day | ORAL | 3 refills | Status: DC

## 2018-06-21 NOTE — Patient Instructions (Addendum)
Medication Instructions:  -  Your physician has recommended you make the following change in your medication:   1) Increase coreg (carvedilol) to 12.5 mg- take 1 tablet by mouth twice daily  If you need a refill on your cardiac medications before your next appointment, please call your pharmacy.   Lab work: - none ordered  If you have labs (blood work) drawn today and your tests are completely normal, you will receive your results only by: Marland Kitchen MyChart Message (if you have MyChart) OR . A paper copy in the mail If you have any lab test that is abnormal or we need to change your treatment, we will call you to review the results.  Testing/Procedures: - none ordered  Follow-Up: At [facility], you and your health needs are our priority.  As part of our continuing mission to provide you with exceptional heart care, we have created designated Provider Care Teams.  These Care Teams include your primary Cardiologist (physician) and Advanced Practice Providers (APPs -  Physician Assistants and Nurse Practitioners) who all work together to provide you with the care you need, when you need it.  You will need a follow up appointment in 3 months with Dr. Rockey Situ (September)    Please call our office 2 months in advance to schedule this appointment (call in late July to schedule)   You may see Ida Rogue, MD or one of the following Advanced Practice Providers on your designated Care Team:   Murray Hodgkins, NP Christell Faith, PA-C . Marrianne Mood, PA-C  You will need a follow up appointment in 9 months with Dr. Caryl Comes (March 2021)    Please call our office 2 months in advance to schedule this appointment. (Please call in early January to schedule)   Remote monitoring is used to monitor your Pacemaker of ICD from home. This monitoring reduces the number of office visits required to check your device to one time per year. It allows [place] to keep an eye on the functioning of your device to ensure it  is working properly. You are scheduled for a device check from home on 09/16/2018. You may send your transmission at any time that day. If you have a wireless device, the transmission will be sent automatically. After your physician reviews your transmission, you will receive a postcard with your next transmission date.   Any Other Special Instructions Will Be Listed Below (If Applicable). - N/A

## 2018-06-29 ENCOUNTER — Other Ambulatory Visit: Payer: Self-pay | Admitting: Internal Medicine

## 2018-07-04 ENCOUNTER — Telehealth: Payer: Self-pay

## 2018-07-04 NOTE — Telephone Encounter (Signed)
Called pt to prescreen for [place], but pt's wife asked to reschedule appt due to another MD appt.  Will call Friday to prescreen for Monday.

## 2018-07-07 ENCOUNTER — Telehealth: Payer: Self-pay

## 2018-07-07 NOTE — Telephone Encounter (Signed)

## 2018-07-11 ENCOUNTER — Ambulatory Visit (INDEPENDENT_AMBULATORY_CARE_PROVIDER_SITE_OTHER): Payer: Medicare HMO

## 2018-07-11 ENCOUNTER — Other Ambulatory Visit: Payer: Self-pay

## 2018-07-11 DIAGNOSIS — Z5181 Encounter for therapeutic drug level monitoring: Secondary | ICD-10-CM | POA: Diagnosis not present

## 2018-07-11 DIAGNOSIS — I480 Paroxysmal atrial fibrillation: Secondary | ICD-10-CM | POA: Diagnosis not present

## 2018-07-11 LAB — POCT INR: INR: 2.6 (ref 2.0–3.0)

## 2018-07-11 MED ORDER — WARFARIN SODIUM 5 MG PO TABS: 5.0000 mg | ORAL_TABLET | Freq: Every day | ORAL | 1 refills | Status: DC

## 2018-07-11 NOTE — Patient Instructions (Signed)
Please continue dosage of 5 mg every day. Recheck in 4 weeks.

## 2018-08-08 ENCOUNTER — Ambulatory Visit (INDEPENDENT_AMBULATORY_CARE_PROVIDER_SITE_OTHER): Payer: Medicare HMO

## 2018-08-08 ENCOUNTER — Other Ambulatory Visit: Payer: Self-pay

## 2018-08-08 DIAGNOSIS — I480 Paroxysmal atrial fibrillation: Secondary | ICD-10-CM | POA: Diagnosis not present

## 2018-08-08 DIAGNOSIS — Z5181 Encounter for therapeutic drug level monitoring: Secondary | ICD-10-CM | POA: Diagnosis not present

## 2018-08-08 LAB — POCT INR: INR: 3.9 — AB (ref 2.0–3.0)

## 2018-08-08 NOTE — Patient Instructions (Signed)
Please skip coumadin tonight, have a serving of greens today, then continue dosage of 5 mg every day. Recheck in 4 weeks.

## 2018-09-05 ENCOUNTER — Other Ambulatory Visit: Payer: Self-pay

## 2018-09-05 ENCOUNTER — Ambulatory Visit (INDEPENDENT_AMBULATORY_CARE_PROVIDER_SITE_OTHER): Payer: Medicare HMO

## 2018-09-05 DIAGNOSIS — I480 Paroxysmal atrial fibrillation: Secondary | ICD-10-CM

## 2018-09-05 DIAGNOSIS — Z5181 Encounter for therapeutic drug level monitoring: Secondary | ICD-10-CM | POA: Diagnosis not present

## 2018-09-05 LAB — POCT INR: INR: 2.1 (ref 2.0–3.0)

## 2018-09-05 NOTE — Patient Instructions (Signed)
Please continue dosage of 5 mg every day. Recheck in 5 weeks.

## 2018-09-16 ENCOUNTER — Ambulatory Visit (INDEPENDENT_AMBULATORY_CARE_PROVIDER_SITE_OTHER): Payer: Medicare HMO | Admitting: *Deleted

## 2018-09-16 DIAGNOSIS — I5022 Chronic systolic (congestive) heart failure: Secondary | ICD-10-CM

## 2018-09-16 DIAGNOSIS — I255 Ischemic cardiomyopathy: Secondary | ICD-10-CM

## 2018-09-16 LAB — CUP PACEART REMOTE DEVICE CHECK
Battery Remaining Longevity: 117 mo
Battery Voltage: 3.07 V
Brady Statistic AP VP Percent: 17.89 %
Brady Statistic AP VS Percent: 0.29 %
Brady Statistic AS VP Percent: 79.67 %
Brady Statistic AS VS Percent: 2.15 %
Brady Statistic RA Percent Paced: 18.17 %
Brady Statistic RV Percent Paced: 17.12 %
Date Time Interrogation Session: 20200911070918
HighPow Impedance: 61 Ohm
Implantable Lead Implant Date: 20200313
Implantable Lead Implant Date: 20200313
Implantable Lead Implant Date: 20200313
Implantable Lead Location: 753858
Implantable Lead Location: 753859
Implantable Lead Location: 753860
Implantable Lead Model: 4396
Implantable Lead Model: 5076
Implantable Lead Model: 6935
Implantable Pulse Generator Implant Date: 20200313
Lead Channel Impedance Value: 304 Ohm
Lead Channel Impedance Value: 361 Ohm
Lead Channel Impedance Value: 399 Ohm
Lead Channel Impedance Value: 418 Ohm
Lead Channel Impedance Value: 456 Ohm
Lead Channel Impedance Value: 760 Ohm
Lead Channel Pacing Threshold Amplitude: 0.625 V
Lead Channel Pacing Threshold Amplitude: 0.75 V
Lead Channel Pacing Threshold Amplitude: 0.875 V
Lead Channel Pacing Threshold Pulse Width: 0.4 ms
Lead Channel Pacing Threshold Pulse Width: 0.4 ms
Lead Channel Pacing Threshold Pulse Width: 0.4 ms
Lead Channel Sensing Intrinsic Amplitude: 12.5 mV
Lead Channel Sensing Intrinsic Amplitude: 12.5 mV
Lead Channel Sensing Intrinsic Amplitude: 2.875 mV
Lead Channel Sensing Intrinsic Amplitude: 2.875 mV
Lead Channel Setting Pacing Amplitude: 1.25 V
Lead Channel Setting Pacing Amplitude: 1.5 V
Lead Channel Setting Pacing Amplitude: 2 V
Lead Channel Setting Pacing Pulse Width: 0.4 ms
Lead Channel Setting Pacing Pulse Width: 0.4 ms
Lead Channel Setting Sensing Sensitivity: 0.3 mV

## 2018-09-23 NOTE — Progress Notes (Signed)
Remote ICD transmission.   

## 2018-10-10 ENCOUNTER — Other Ambulatory Visit: Payer: Self-pay

## 2018-10-10 ENCOUNTER — Ambulatory Visit (INDEPENDENT_AMBULATORY_CARE_PROVIDER_SITE_OTHER): Payer: Medicare HMO

## 2018-10-10 DIAGNOSIS — I480 Paroxysmal atrial fibrillation: Secondary | ICD-10-CM | POA: Diagnosis not present

## 2018-10-10 DIAGNOSIS — Z5181 Encounter for therapeutic drug level monitoring: Secondary | ICD-10-CM

## 2018-10-10 LAB — POCT INR: INR: 3.4 — AB (ref 2.0–3.0)

## 2018-10-10 MED ORDER — SACUBITRIL-VALSARTAN 24-26 MG PO TABS: 1.0000 | ORAL_TABLET | Freq: Two times a day (BID) | ORAL | 1 refills | Status: DC

## 2018-10-10 MED ORDER — WARFARIN SODIUM 5 MG PO TABS: 5.0000 mg | ORAL_TABLET | Freq: Every day | ORAL | 1 refills | Status: DC

## 2018-10-10 NOTE — Patient Instructions (Signed)
Please have a large serving of greens today, skip warfarin tomorrow, then continue dosage of 5 mg every day. Recheck in 4 weeks.

## 2018-10-10 NOTE — Telephone Encounter (Signed)
*  STAT* If patient is at the pharmacy, call can be transferred to refill team.   1. Which medications need to be refilled? (please list name of each medication and dose if known) Entresto  2. Which pharmacy/location (including street and city if local pharmacy) is medication to be sent to? [facility]  3. Do they need a 30 day or 90 day supply? 90   

## 2018-10-27 ENCOUNTER — Other Ambulatory Visit: Payer: Self-pay

## 2018-10-27 MED ORDER — SACUBITRIL-VALSARTAN 24-26 MG PO TABS: 1.0000 | ORAL_TABLET | Freq: Two times a day (BID) | ORAL | 0 refills | Status: DC

## 2018-10-27 NOTE — Telephone Encounter (Signed)
Pt due for 3 month f/u. Please contact pt for future appointment. 

## 2018-10-27 NOTE — Telephone Encounter (Signed)
Patient would like Entresto samples.   *STAT* If patient is at the pharmacy, call can be transferred to refill team.   1. Which medications need to be refilled? (please list name of each medication and dose if known) Entresto  2. Which pharmacy/location (including street and city if local pharmacy) is medication to be sent to? [facility]  3. Do they need a 30 day or 90 day supply? [place]

## 2018-10-28 ENCOUNTER — Telehealth: Payer: Self-pay | Admitting: Cardiovascular Disease

## 2018-10-28 NOTE — Telephone Encounter (Signed)
Called patient to schedule follow up Pt c/o medication issue:  1. Name of Medication: Entresto   2. How are you currently taking this medication (dosage and times per day)? 24-26 1 tablet 2 times daily   3. Are you having a reaction (difficulty breathing--STAT)? no  4. What is your medication issue? Patient spouse states that medication is $400 and is unable to afford.  They will not be picking up medication, if there are any other options, please call to discuss.

## 2018-10-28 NOTE — Telephone Encounter (Signed)
Patient wife did not have schedule available Will call back to schedule

## 2018-11-01 NOTE — Telephone Encounter (Signed)
Tried to call patient's wife back, no answer and voicemail is full, unable to leave message. In the mean time will send message to Dr. Arta Bruce to see if patient can take something else instead of Entresto.

## 2018-11-03 ENCOUNTER — Other Ambulatory Visit: Payer: Self-pay

## 2018-11-03 ENCOUNTER — Encounter: Payer: Self-pay | Admitting: Cardiovascular Disease

## 2018-11-03 MED ORDER — WARFARIN SODIUM 5 MG PO TABS: 5.0000 mg | ORAL_TABLET | Freq: Every day | ORAL | 1 refills | Status: DC

## 2018-11-03 NOTE — Telephone Encounter (Signed)
Patient calling back Please call to discuss  Patient has a new phone and is unaware on how to fix voicemail but will try and call back later if not reached

## 2018-11-03 NOTE — Telephone Encounter (Signed)
I spoke with the patient's wife. She states entresto has gone from a little over $200/ per to $400/month. They could barely afford the $200/ month and just cannot pay $400.  I advised I understand where they are coming from. I inquired if he had applied for patient assistance. Per Mrs. Sullivant, they did have [employer]iqu[employer] patient assistance at one time, but then their insurance changed and paid a little more on the eliquis so they lost this.  I have advised that we try to apply for assistance for entresto to see where we can get with that. She is agreeable. The patient has also been out of entresto x 1 week. I advised her I have pulled samples for him for the time being.  Mrs. Magowan states she will come by the office tomorrow to pick up the assistance forms and entresto samples.  Samples given: Entresto 24/26 mg Lot: [id number] Exp: 05/2020 # 1 bottle (28 tablets)

## 2018-11-03 NOTE — Telephone Encounter (Signed)
*  STAT* If patient is at the pharmacy, call can be transferred to refill team.   1. Which medications need to be refilled? (please list name of each medication and dose if known) Warfarin  2. Which pharmacy/location (including street and city if local pharmacy) is medication to be sent to? [facility] [place]  3. Do they need a 30 day or 90 day supply?

## 2018-11-03 NOTE — Telephone Encounter (Signed)
This encounter was created in error - please disregard.

## 2018-11-08 NOTE — Telephone Encounter (Signed)
Patient dropped off Patient Application for [employer] to be completed Placed in nurse box

## 2018-11-09 NOTE — Telephone Encounter (Signed)
Application completed and pending provider signature. Will update once that has been done.

## 2018-11-09 NOTE — Telephone Encounter (Signed)
Spoke with patient and reviewed that application was completed and that I would be happy to fax to company for her. She was very appreciative with no further questions at this time.

## 2018-11-14 ENCOUNTER — Other Ambulatory Visit: Payer: Self-pay

## 2018-11-14 ENCOUNTER — Ambulatory Visit (INDEPENDENT_AMBULATORY_CARE_PROVIDER_SITE_OTHER): Payer: Medicare HMO

## 2018-11-14 DIAGNOSIS — I480 Paroxysmal atrial fibrillation: Secondary | ICD-10-CM | POA: Diagnosis not present

## 2018-11-14 DIAGNOSIS — Z5181 Encounter for therapeutic drug level monitoring: Secondary | ICD-10-CM

## 2018-11-14 LAB — POCT INR: INR: 3.5 — AB (ref 2.0–3.0)

## 2018-11-14 NOTE — Patient Instructions (Signed)
Please have a large serving of greens today, skip warfarin today, then START NEW DOSAGE of 5 mg every day except 1/2 TABLET on [place]. Recheck in 3 weeks.

## 2018-11-29 NOTE — Progress Notes (Signed)
Cardiology Office Note  Date:  11/30/2018   ID:  Adam Douglas, Adam Douglas 1950/12/04, MRN [id number]  PCP:  Etheleen Nicks, NP   Chief Complaint  Patient presents with  . Other    3 month follow up. patient c/o some swelling in legs. Meds reviewed verbally with patient.     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,  Admission 03/03/2017  for non-STEMI with finding of left bundle branch block,  severe multivessel CAD and an ischemic cardiomyopathy with an EF of 20-25% ICD Who presents for follow up of his cardiomyopathy and CAD  In follow-up today reports that he feels relatively well Denies significant leg swelling, abdominal bloating Takes Lasix daily Wife sometimes gives him extra Lasix for facial swelling  Prior echocardiograms have been reviewed EF 20 2019 to 30-35% March 2020  Has not received entresto in the mail from the company BP at home 130-140 typically Today at home was 150  Lab work reviewed HBA1C 7.2  EKG personally reviewed by myself on todays visit Shows normal sinus rhythm rate 63 bpm , left bundle branch block,   Other past medical history reviewed  diagnostic catheterization 03/03/2017 revealing moderate to severe diffuse, small vessel coronary artery disease with an EF of less than 20% by ventriculography.   Moderate to severe mid and distal LAD disease severe disease of small diagonal vessels, Severe disease of small OM1 and om2 vessels Medical management recommended   PMH:   has a past medical history of CAD (coronary artery disease), Diabetes mellitus without complication (HCC), HFrEF (heart failure with reduced ejection fraction) (HCC), Hyperlipidemia, Hypertension, Ischemic cardiomyopathy, LBBB (left bundle branch block), Morbid obesity (HCC), and Stroke (HCC).  PSH:    Past Surgical History:  Procedure Laterality Date  . BIV ICD INSERTION CRT-D N/A 03/18/2018    Procedure: BIV ICD INSERTION CRT-D;  Surgeon: Duke Salvia, MD;  Location: [facility] INVASIVE CV LAB;  Service: Cardiovascular;  Laterality: N/A;  . CERVICAL DISC SURGERY    . CHOLECYSTECTOMY    . LEFT HEART CATH AND CORONARY ANGIOGRAPHY N/A 03/03/2017   Procedure: LEFT HEART CATH AND CORONARY ANGIOGRAPHY;  Surgeon: Antonieta Iba, MD;  Location: [facility] INVASIVE CV LAB;  Service: Cardiovascular;  Laterality: N/A;    Current Outpatient Medications  Medication Sig Dispense Refill  . acetaminophen (TYLENOL 8 HOUR ARTHRITIS PAIN) 650 MG CR tablet Take 1,300 mg by mouth 2 (two) times daily.    Marland Kitchen aspirin EC 81 MG tablet Take 1 tablet (81 mg total) by mouth daily. 90 tablet 3  . atorvastatin (LIPITOR) 40 MG tablet Take 40 mg by mouth daily.     . citalopram (CELEXA) 40 MG tablet Take 40 mg by mouth daily.    . furosemide (LASIX) 20 MG tablet Take 1 tablet (20 mg total) by mouth daily. 90 tablet 2  . insulin aspart (NOVOLOG) 100 UNIT/ML injection Inject 15 Units into the skin 3 (three) times daily with meals.    . insulin detemir (LEVEMIR) 100 UNIT/ML injection Inject 20 Units into the skin as needed.    . metFORMIN (GLUCOPHAGE-XR) 500 MG 24 hr tablet Take 500 mg by mouth 2 (two) times daily.    . nitroGLYCERIN (NITROSTAT) 0.4 MG SL tablet Place 1 tablet (0.4 mg total) under the tongue every 5 (five) minutes as needed. 25 tablet 2  . omeprazole (PRILOSEC) 20 MG capsule Take 20 mg by mouth 2 (  two) times daily before a meal.    . sacubitril-valsartan (ENTRESTO) 24-26 MG Take 1 tablet by mouth 2 (two) times daily. 180 tablet 0  . traMADol (ULTRAM) 50 MG tablet Take 50 mg by mouth every 8 (eight) hours as needed for pain.    . carvedilol (COREG) 12.5 MG tablet Take 1 tablet (12.5 mg total) by mouth 2 (two) times daily. 180 tablet 3  . spironolactone (ALDACTONE) 25 MG tablet Take 0.5 tablets (12.5 mg total) by mouth daily. 45 tablet 3  . warfarin (COUMADIN) 5 MG tablet Take 1 tablet (5 mg total) by mouth  daily. 30 tablet 1   No current facility-administered medications for this visit.      Allergies:   Patient has no known allergies.   Social History:  The patient  reports that he has quit smoking. He has a 15.00 pack-year smoking history. He has never used smokeless tobacco. He reports that he does not drink alcohol or use drugs.   Family History:   family history includes CAD in his sister; CAD (age of onset: 40) in his father; CAD (age of onset: 46) in his mother; Heart attack in his father and mother.    Review of Systems: Review of Systems  Constitutional: Negative.   HENT: Negative.   Respiratory: Negative.   Cardiovascular: Negative.   Gastrointestinal: Negative.   Musculoskeletal: Negative.   Neurological: Negative.   Psychiatric/Behavioral: Negative.   All other systems reviewed and are negative.   PHYSICAL EXAM: VS:  BP (!) 146/80 (BP Location: Left Arm, Patient Position: Sitting, Cuff Size: Normal)   Pulse 63   Ht 5\' 10"  (1.778 m)   Wt 245 lb (111.1 kg)   BMI 35.15 kg/m  , BMI Body mass index is 35.15 kg/m. Constitutional:  oriented to person, place, and time. No distress.  HENT:  Head: Grossly normal Eyes:  no discharge. No scleral icterus.  Neck: No JVD, no carotid bruits  Cardiovascular: Regular rate and rhythm, no murmurs appreciated Pulmonary/Chest: Clear to auscultation bilaterally, no wheezes or rails Abdominal: Soft.  no distension.  no tenderness.  Musculoskeletal: Normal range of motion Neurological:  normal muscle tone. Coordination normal. No atrophy Skin: Skin warm and dry Psychiatric: normal affect, pleasant  Recent Labs: 03/17/2018: BUN 21; Creatinine, Ser 1.00; Hemoglobin 13.8; Platelets 210; Potassium 4.6; Sodium 140    Lipid Panel Lab Results  Component Value Date   CHOL 94 03/04/2017   HDL 35 (L) 03/04/2017   LDLCALC 36 03/04/2017   TRIG 117 03/04/2017     Wt Readings from Last 3 Encounters:  11/30/18 245 lb (111.1 kg)   06/21/18 229 lb (103.9 kg)  03/19/18 231 lb 9.6 oz (105.1 kg)     ASSESSMENT AND PLAN:  Atherosclerosis of native coronary artery of native heart with stable angina pectoris (HCC) - Plan: EKG 12-Lead Severe underlying coronary disease  Cholesterol at goal, denies any anginal symptoms No further testing at this time  Ischemic cardiomyopathy On Entresto yet has not received this in the mail We would hope to increase the low-dose if blood pressure tolerates over the next several months Continue  other medications  Chronic systolic heart failure (HCC) Approved for Entresto through the company, family has contacted the company and they report to be sending this in the mail  Type 2 diabetes mellitus with other circulatory complication, with long-term current use of insulin (HCC) Stressed importance of aggressive diet, walking program for weight loss Most recent A1c 7.2 down from  7.8  Hyperlipidemia LDL goal <70 Cholesterol is at goal on the current lipid regimen. No changes to the medications were made.  Essential hypertension About to start Entresto Recommended he monitor blood pressure at home and call our office with numbers in the next month or so  Disposition:   F/U  6 months   Total encounter time more than 25 minutes  Greater than 50% was spent in counseling and coordination of care with the patient    Orders Placed This Encounter  Procedures  . EKG 12-Lead     Signed, Esmond Plants, M.D., Ph.D. 11/30/2018  [place], [place]

## 2018-11-30 ENCOUNTER — Encounter: Payer: Self-pay | Admitting: Cardiovascular Disease

## 2018-11-30 ENCOUNTER — Ambulatory Visit (INDEPENDENT_AMBULATORY_CARE_PROVIDER_SITE_OTHER): Payer: Medicare HMO | Admitting: Cardiovascular Disease

## 2018-11-30 ENCOUNTER — Other Ambulatory Visit: Payer: Self-pay

## 2018-11-30 ENCOUNTER — Ambulatory Visit (INDEPENDENT_AMBULATORY_CARE_PROVIDER_SITE_OTHER): Payer: Medicare HMO

## 2018-11-30 VITALS — BP 146/80 | HR 63 | Ht 70.0 in | Wt 245.0 lb

## 2018-11-30 DIAGNOSIS — Z95810 Presence of automatic (implantable) cardiac defibrillator: Secondary | ICD-10-CM

## 2018-11-30 DIAGNOSIS — Z5181 Encounter for therapeutic drug level monitoring: Secondary | ICD-10-CM | POA: Diagnosis not present

## 2018-11-30 DIAGNOSIS — I480 Paroxysmal atrial fibrillation: Secondary | ICD-10-CM | POA: Diagnosis not present

## 2018-11-30 DIAGNOSIS — I5022 Chronic systolic (congestive) heart failure: Secondary | ICD-10-CM

## 2018-11-30 DIAGNOSIS — I447 Left bundle-branch block, unspecified: Secondary | ICD-10-CM

## 2018-11-30 DIAGNOSIS — I10 Essential (primary) hypertension: Secondary | ICD-10-CM

## 2018-11-30 DIAGNOSIS — I255 Ischemic cardiomyopathy: Secondary | ICD-10-CM

## 2018-11-30 LAB — POCT INR: INR: 1.4 — AB (ref 2.0–3.0)

## 2018-11-30 MED ORDER — WARFARIN SODIUM 5 MG PO TABS: 5.0000 mg | ORAL_TABLET | Freq: Every day | ORAL | 1 refills | Status: DC

## 2018-11-30 NOTE — Patient Instructions (Addendum)
Please call with blood pressure measurements on the entresto twice a day   Medication Instructions:  No changes  If you need a refill on your cardiac medications before your next appointment, please call your pharmacy.    Lab work: CMP done today.   If you have labs (blood work) drawn today and your tests are completely normal, you will receive your results only by: Marland Kitchen MyChart Message (if you have MyChart) OR . A paper copy in the mail If you have any lab test that is abnormal or we need to change your treatment, we will call you to review the results.   Testing/Procedures: No new testing needed   Follow-Up: At [facility], you and your health needs are our priority.  As part of our continuing mission to provide you with exceptional heart care, we have created designated Provider Care Teams.  These Care Teams include your primary Cardiologist (physician) and Advanced Practice Providers (APPs -  Physician Assistants and Nurse Practitioners) who all work together to provide you with the care you need, when you need it.  . You will need a follow up appointment in 2 months (virtual Ok)   . Providers on your designated Care Team:   . Murray Hodgkins, NP . Christell Faith, PA-C . Marrianne Mood, PA-C  Any Other Special Instructions Will Be Listed Below (If Applicable).  For educational health videos Log in to : [email or web address] Or : [email or web address], password : [email or web address]   How to Take Your Blood Pressure You can take your blood pressure at home with a machine. You may need to check your blood pressure at home:  To check if you have high blood pressure (hypertension).  To check your blood pressure over time.  To make sure your blood pressure medicine is working. Supplies needed: You will need a blood pressure machine, or monitor. You can buy one at a drugstore or online. When choosing one:  Choose one with an arm cuff.  Choose one that wraps around your upper arm. Only one  finger should fit between your arm and the cuff.  Do not choose one that measures your blood pressure from your wrist or finger. Your doctor can suggest a monitor. How to prepare Avoid these things for 30 minutes before checking your blood pressure:  Drinking caffeine.  Drinking alcohol.  Eating.  Smoking.  Exercising. Five minutes before checking your blood pressure:  Pee.  Sit in a dining chair. Avoid sitting in a soft couch or armchair.  Be quiet. Do not talk. How to take your blood pressure Follow the instructions that came with your machine. If you have a digital blood pressure monitor, these may be the instructions: 1. Sit up straight. 2. Place your feet on the floor. Do not cross your ankles or legs. 3. Rest your left arm at the level of your heart. You may rest it on a table, desk, or chair. 4. Pull up your shirt sleeve. 5. Wrap the blood pressure cuff around the upper part of your left arm. The cuff should be 1 inch (2.5 cm) above your elbow. It is best to wrap the cuff around bare skin. 6. Fit the cuff snugly around your arm. You should be able to place only one finger between the cuff and your arm. 7. Put the cord inside the groove of your elbow. 8. Press the power button. 9. Sit quietly while the cuff fills with air and loses air. 10. Write down the numbers on the screen. 11.  Wait 2-3 minutes and then repeat steps 1-10. What do the numbers mean? Two numbers make up your blood pressure. The first number is called systolic pressure. The second is called diastolic pressure. An example of a blood pressure reading is "120 over 80" (or 120/80). If you are an adult and do not have a medical condition, use this guide to find out if your blood pressure is normal: Normal  First number: below 120.  Second number: below 80. Elevated  First number: 120-129.  Second number: below 80. Hypertension stage 1  First number: 130-139.  Second number: 80-89. Hypertension  stage 2  First number: 140 or above.  Second number: 90 or above. Your blood pressure is above normal even if only the top or bottom number is above normal. Follow these instructions at home:  Check your blood pressure as often as your doctor tells you to.  Take your monitor to your next doctor's appointment. Your doctor will: ? Make sure you are using it correctly. ? Make sure it is working right.  Make sure you understand what your blood pressure numbers should be.  Tell your doctor if your medicines are causing side effects. Contact a doctor if:  Your blood pressure keeps being high. Get help right away if:  Your first blood pressure number is higher than 180.  Your second blood pressure number is higher than 120. This information is not intended to replace advice given to you by your health care provider. Make sure you discuss any questions you have with your health care provider. Document Released: 12/05/2007 Document Revised: 12/04/2016 Document Reviewed: 05/31/2015 Elsevier Patient Education  2020 [employer].

## 2018-11-30 NOTE — Patient Instructions (Signed)
Please take 1.5 tablets today and tomorrow, then resume dosage of 5 mg every day except 1/2 TABLET on [place]. Recheck in 3 weeks.

## 2018-12-01 LAB — COMPREHENSIVE METABOLIC PANEL
ALT: 21 IU/L (ref 0–44)
AST: 19 IU/L (ref 0–40)
Albumin/Globulin Ratio: 1.6 (ref 1.2–2.2)
Albumin: 4.1 g/dL (ref 3.8–4.8)
Alkaline Phosphatase: 98 IU/L (ref 39–117)
BUN/Creatinine Ratio: 25 — ABNORMAL HIGH (ref 10–24)
BUN: 26 mg/dL (ref 8–27)
Bilirubin Total: 0.3 mg/dL (ref 0.0–1.2)
CO2: 19 mmol/L — ABNORMAL LOW (ref 20–29)
Calcium: 9.3 mg/dL (ref 8.6–10.2)
Chloride: 103 mmol/L (ref 96–106)
Creatinine, Ser: 1.06 mg/dL (ref 0.76–1.27)
GFR calc Af Amer: 83 mL/min/{1.73_m2} (ref >59)
GFR calc non Af Amer: 72 mL/min/{1.73_m2} (ref >59)
Globulin, Total: 2.6 g/dL (ref 1.5–4.5)
Glucose: 168 mg/dL — ABNORMAL HIGH (ref 65–99)
Potassium: 4.1 mmol/L (ref 3.5–5.2)
Sodium: 139 mmol/L (ref 134–144)
Total Protein: 6.7 g/dL (ref 6.0–8.5)

## 2018-12-09 ENCOUNTER — Other Ambulatory Visit: Payer: Self-pay | Admitting: *Deleted

## 2018-12-09 ENCOUNTER — Telehealth: Payer: Self-pay | Admitting: *Deleted

## 2018-12-09 MED ORDER — SACUBITRIL-VALSARTAN 24-26 MG PO TABS: 1.0000 | ORAL_TABLET | Freq: Two times a day (BID) | ORAL | 3 refills | Status: DC

## 2018-12-09 NOTE — Telephone Encounter (Signed)
Pt requiring [employer] paper work for Patient assistance to be completed. Forms have been completed and placed in MD box for signature.

## 2018-12-14 ENCOUNTER — Other Ambulatory Visit: Payer: Self-pay

## 2018-12-14 NOTE — Telephone Encounter (Signed)
*  STAT* If patient is at the pharmacy, call can be transferred to refill team.   1. Which medications need to be refilled? (please list name of each medication and dose if known) Lasix  2. Which pharmacy/location (including street and city if local pharmacy) is medication to be sent to? [facility] [place]  3. Do they need a 30 day or 90 day supply? [place]

## 2018-12-15 MED ORDER — FUROSEMIDE 20 MG PO TABS: 20.0000 mg | ORAL_TABLET | Freq: Every day | ORAL | 1 refills | Status: DC

## 2018-12-16 ENCOUNTER — Ambulatory Visit (INDEPENDENT_AMBULATORY_CARE_PROVIDER_SITE_OTHER): Payer: Medicare HMO | Admitting: *Deleted

## 2018-12-16 DIAGNOSIS — I5022 Chronic systolic (congestive) heart failure: Secondary | ICD-10-CM | POA: Diagnosis not present

## 2018-12-16 LAB — CUP PACEART REMOTE DEVICE CHECK
Battery Remaining Longevity: 112 mo
Battery Voltage: 3.03 V
Brady Statistic AP VP Percent: 25.96 %
Brady Statistic AP VS Percent: 0.44 %
Brady Statistic AS VP Percent: 71.71 %
Brady Statistic AS VS Percent: 1.89 %
Brady Statistic RA Percent Paced: 26.38 %
Brady Statistic RV Percent Paced: 25.7 %
Date Time Interrogation Session: 20201211044223
HighPow Impedance: 64 Ohm
Implantable Lead Implant Date: 20200313
Implantable Lead Implant Date: 20200313
Implantable Lead Implant Date: 20200313
Implantable Lead Location: 753858
Implantable Lead Location: 753859
Implantable Lead Location: 753860
Implantable Lead Model: 4396
Implantable Lead Model: 5076
Implantable Lead Model: 6935
Implantable Pulse Generator Implant Date: 20200313
Lead Channel Impedance Value: 285 Ohm
Lead Channel Impedance Value: 361 Ohm
Lead Channel Impedance Value: 399 Ohm
Lead Channel Impedance Value: 399 Ohm
Lead Channel Impedance Value: 475 Ohm
Lead Channel Impedance Value: 760 Ohm
Lead Channel Pacing Threshold Amplitude: 0.75 V
Lead Channel Pacing Threshold Amplitude: 0.875 V
Lead Channel Pacing Threshold Amplitude: 1 V
Lead Channel Pacing Threshold Pulse Width: 0.4 ms
Lead Channel Pacing Threshold Pulse Width: 0.4 ms
Lead Channel Pacing Threshold Pulse Width: 0.4 ms
Lead Channel Sensing Intrinsic Amplitude: 11.25 mV
Lead Channel Sensing Intrinsic Amplitude: 11.25 mV
Lead Channel Sensing Intrinsic Amplitude: 2.5 mV
Lead Channel Sensing Intrinsic Amplitude: 2.5 mV
Lead Channel Setting Pacing Amplitude: 1.5 V
Lead Channel Setting Pacing Amplitude: 1.5 V
Lead Channel Setting Pacing Amplitude: 2 V
Lead Channel Setting Pacing Pulse Width: 0.4 ms
Lead Channel Setting Pacing Pulse Width: 0.4 ms
Lead Channel Setting Sensing Sensitivity: 0.3 mV

## 2018-12-21 ENCOUNTER — Other Ambulatory Visit: Payer: Self-pay

## 2018-12-21 ENCOUNTER — Ambulatory Visit (INDEPENDENT_AMBULATORY_CARE_PROVIDER_SITE_OTHER): Payer: Medicare HMO

## 2018-12-21 DIAGNOSIS — I480 Paroxysmal atrial fibrillation: Secondary | ICD-10-CM

## 2018-12-21 DIAGNOSIS — Z5181 Encounter for therapeutic drug level monitoring: Secondary | ICD-10-CM | POA: Diagnosis not present

## 2018-12-21 LAB — POCT INR: INR: 1.8 — AB (ref 2.0–3.0)

## 2018-12-21 NOTE — Patient Instructions (Signed)
Please take 1.5 tablets today then resume dosage of 5 mg every day except 1/2 TABLET on [place]. Recheck in 3 weeks.

## 2019-01-11 ENCOUNTER — Ambulatory Visit (INDEPENDENT_AMBULATORY_CARE_PROVIDER_SITE_OTHER): Payer: Medicare HMO

## 2019-01-11 ENCOUNTER — Other Ambulatory Visit: Payer: Self-pay

## 2019-01-11 DIAGNOSIS — I480 Paroxysmal atrial fibrillation: Secondary | ICD-10-CM

## 2019-01-11 DIAGNOSIS — Z5181 Encounter for therapeutic drug level monitoring: Secondary | ICD-10-CM | POA: Diagnosis not present

## 2019-01-11 LAB — POCT INR: INR: 2.3 (ref 2.0–3.0)

## 2019-01-11 NOTE — Patient Instructions (Signed)
Please continue dosage of 5 mg every day except 1/2 TABLET on MONDAYS & FRIDAYS. Recheck in 4 weeks.

## 2019-01-21 NOTE — Progress Notes (Signed)
ICD remote 
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
## 2019-02-01 ENCOUNTER — Other Ambulatory Visit: Payer: Self-pay

## 2019-02-01 ENCOUNTER — Encounter: Payer: Self-pay | Admitting: Cardiovascular Disease

## 2019-02-01 ENCOUNTER — Telehealth (INDEPENDENT_AMBULATORY_CARE_PROVIDER_SITE_OTHER): Payer: Medicare HMO | Admitting: Cardiovascular Disease

## 2019-02-01 VITALS — BP 160/80 | HR 65 | Ht 70.5 in | Wt 240.0 lb

## 2019-02-01 DIAGNOSIS — I10 Essential (primary) hypertension: Secondary | ICD-10-CM

## 2019-02-01 DIAGNOSIS — I110 Hypertensive heart disease with heart failure: Secondary | ICD-10-CM | POA: Diagnosis not present

## 2019-02-01 DIAGNOSIS — Z95810 Presence of automatic (implantable) cardiac defibrillator: Secondary | ICD-10-CM

## 2019-02-01 DIAGNOSIS — I5022 Chronic systolic (congestive) heart failure: Secondary | ICD-10-CM | POA: Diagnosis not present

## 2019-02-01 DIAGNOSIS — I255 Ischemic cardiomyopathy: Secondary | ICD-10-CM

## 2019-02-01 DIAGNOSIS — I480 Paroxysmal atrial fibrillation: Secondary | ICD-10-CM | POA: Diagnosis not present

## 2019-02-01 DIAGNOSIS — I447 Left bundle-branch block, unspecified: Secondary | ICD-10-CM

## 2019-02-01 DIAGNOSIS — Z8673 Personal history of transient ischemic attack (TIA), and cerebral infarction without residual deficits: Secondary | ICD-10-CM

## 2019-02-01 MED ORDER — WARFARIN SODIUM 5 MG PO TABS: 5.0000 mg | ORAL_TABLET | ORAL | 1 refills | Status: DC

## 2019-02-01 MED ORDER — ENTRESTO 49-51 MG PO TABS: 1.0000 | ORAL_TABLET | Freq: Two times a day (BID) | ORAL | 11 refills | Status: DC

## 2019-02-01 NOTE — Patient Instructions (Addendum)
Medication Instructions:  Your physician has recommended you make the following change in your medication:  1. INCREASE Entresto to 49/51 mg twice a day  Your physician has requested that you regularly monitor and record your blood pressure readings at home. Please use the same machine at the same time of day to check your readings and record them to bring to your follow-up visit.  Please check your blood pressures 2 hours after your medications and keep a log of those readings. Please read additional information below about blood pressure monitoring.   Call us in several weeks time with blood pressure measurements   If you need a refill on your cardiac medications before your next appointment, please call your pharmacy.    Lab work: No new labs needed   If you have labs (blood work) drawn today and your tests are completely normal, you will receive your results only by: Marland Kitchen MyChart Message (if you have MyChart) OR . A paper copy in the mail If you have any lab test that is abnormal or we need to change your treatment, we will call you to review the results.   Testing/Procedures: No new testing needed   Follow-Up: At [facility], you and your health needs are our priority.  As part of our continuing mission to provide you with exceptional heart care, we have created designated Provider Care Teams.  These Care Teams include your primary Cardiologist (physician) and Advanced Practice Providers (APPs -  Physician Assistants and Nurse Practitioners) who all work together to provide you with the care you need, when you need it.  . You will need a follow up appointment in 12 months .   Please call our office 2 months in advance to schedule this appointment.    . Providers on your designated Care Team:   . Nicolasa Ducking, NP . Eula Listen, PA-C . Marisue Ivan, PA-C  Any Other Special Instructions Will Be Listed Below (If Applicable).  For educational health videos Log in to :  [email or web address] Or : [email or web address], password : [email or web address]   How to Take Your Blood Pressure You can take your blood pressure at home with a machine. You may need to check your blood pressure at home:  To check if you have high blood pressure (hypertension).  To check your blood pressure over time.  To make sure your blood pressure medicine is working. Supplies needed: You will need a blood pressure machine, or monitor. You can buy one at a drugstore or online. When choosing one:  Choose one with an arm cuff.  Choose one that wraps around your upper arm. Only one finger should fit between your arm and the cuff.  Do not choose one that measures your blood pressure from your wrist or finger. Your doctor can suggest a monitor. How to prepare Avoid these things for 30 minutes before checking your blood pressure:  Drinking caffeine.  Drinking alcohol.  Eating.  Smoking.  Exercising. Five minutes before checking your blood pressure:  Pee.  Sit in a dining chair. Avoid sitting in a soft couch or armchair.  Be quiet. Do not talk. How to take your blood pressure Follow the instructions that came with your machine. If you have a digital blood pressure monitor, these may be the instructions: 1. Sit up straight. 2. Place your feet on the floor. Do not cross your ankles or legs. 3. Rest your left arm at the level of your heart. You may rest it on a table, desk,  or chair. 4. Pull up your shirt sleeve. 5. Wrap the blood pressure cuff around the upper part of your left arm. The cuff should be 1 inch (2.5 cm) above your elbow. It is best to wrap the cuff around bare skin. 6. Fit the cuff snugly around your arm. You should be able to place only one finger between the cuff and your arm. 7. Put the cord inside the groove of your elbow. 8. Press the power button. 9. Sit quietly while the cuff fills with air and loses air. 10. Write down the numbers on the screen. 11. Wait 2-3 minutes and  then repeat steps 1-10. What do the numbers mean? Two numbers make up your blood pressure. The first number is called systolic pressure. The second is called diastolic pressure. An example of a blood pressure reading is "120 over 80" (or 120/80). If you are an adult and do not have a medical condition, use this guide to find out if your blood pressure is normal: Normal  First number: below 120.  Second number: below 80. Elevated  First number: 120-129.  Second number: below 80. Hypertension stage 1  First number: 130-139.  Second number: 80-89. Hypertension stage 2  First number: 140 or above.  Second number: 43 or above. Your blood pressure is above normal even if only the top or bottom number is above normal. Follow these instructions at home:  Check your blood pressure as often as your doctor tells you to.  Take your monitor to your next doctor's appointment. Your doctor will: ? Make sure you are using it correctly. ? Make sure it is working right.  Make sure you understand what your blood pressure numbers should be.  Tell your doctor if your medicines are causing side effects. Contact a doctor if:  Your blood pressure keeps being high. Get help right away if:  Your first blood pressure number is higher than 180.  Your second blood pressure number is higher than 120. This information is not intended to replace advice given to you by your health care provider. Make sure you discuss any questions you have with your health care provider. Document Revised: 12/04/2016 Document Reviewed: 05/31/2015 Elsevier Patient Education  2020 [employer].

## 2019-02-08 ENCOUNTER — Other Ambulatory Visit: Payer: Self-pay

## 2019-02-08 ENCOUNTER — Ambulatory Visit (INDEPENDENT_AMBULATORY_CARE_PROVIDER_SITE_OTHER): Payer: Medicare HMO

## 2019-02-08 DIAGNOSIS — Z5181 Encounter for therapeutic drug level monitoring: Secondary | ICD-10-CM

## 2019-02-08 DIAGNOSIS — I480 Paroxysmal atrial fibrillation: Secondary | ICD-10-CM | POA: Diagnosis not present

## 2019-02-08 LAB — POCT INR: INR: 2.3 (ref 2.0–3.0)
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
## 2019-02-08 NOTE — Patient Instructions (Addendum)
Please continue dosage of 5 mg every day except 1/2 TABLET on MONDAYS & FRIDAYS. Recheck in 5 weeks.

## 2019-02-16 ENCOUNTER — Telehealth: Payer: Self-pay | Admitting: Cardiovascular Disease

## 2019-02-16 NOTE — Telephone Encounter (Signed)
Fax received from [employer] on 02/01/19 for Sherryll Burger stating the patient has been approved for assistance for the remainder of the calendar year.   Copy of the fax placed in the file cabinet in the drug closet.

## 2019-03-08 ENCOUNTER — Telehealth: Payer: Self-pay | Admitting: Cardiovascular Disease

## 2019-03-08 MED ORDER — ENTRESTO 49-51 MG PO TABS: 1.0000 | ORAL_TABLET | Freq: Two times a day (BID) | ORAL | 3 refills | Status: DC

## 2019-03-08 NOTE — Telephone Encounter (Signed)
Patient wife is calling in about the medication dose for entresto. The paitient is stating the  manufacturer is saying they do not have the correct new dosage for patient, they have 24-26 mg. Patient has been increased to 49-51 mg by Dr, Mariah Milling and they need this sent to them.   Patient uses the assistance foundation. The phone number for the foundation is [phone]

## 2019-03-08 NOTE — Telephone Encounter (Signed)
Spoke with patients wife per release form and reviewed that we had sent in script to mail order pharmacy at last visit and she stated the company did mention that to her on last call. She has had to call 4 times and he still does not have medication. Had APP in office sign written prescription due to other provider not available in office and will fax to [employer] directly requesting that they please send his medication ASAP due to him being out of medication. She was appreciative for the call with no further questions.

## 2019-03-14 ENCOUNTER — Telehealth: Payer: Self-pay | Admitting: Internal Medicine

## 2019-03-14 MED ORDER — ENTRESTO 49-51 MG PO TABS: 1.0000 | ORAL_TABLET | Freq: Two times a day (BID) | ORAL | 3 refills | Status: DC

## 2019-03-14 NOTE — Addendum Note (Signed)
Addended by: Bryna Colander on: 03/14/2019 05:19 PM   Modules accepted: Orders

## 2019-03-14 NOTE — Telephone Encounter (Signed)
Virtual Visit Pre-Appointment Phone Call  "(Name), I am calling you today to discuss your upcoming appointment. We are currently trying to limit exposure to the virus that causes COVID-19 by seeing patients at home rather than in the office."  1. "What is the BEST phone number to call the day of the visit?" - include this in appointment notes  2. "Do you have or have access to (through a family member/friend) a smartphone with video capability that we can use for your visit?" a. If yes - list this number in appt notes as "cell" (if different from BEST phone #) and list the appointment type as a VIDEO visit in appointment notes b. If no - list the appointment type as a PHONE visit in appointment notes  Confirm consent - "In the setting of the current Covid19 crisis, you are scheduled for a (phone or video) visit with your provider on (date) at (time).  Just as we do with many in-office visits, in order for you to participate in this visit, we must obtain consent.  If you'd like, I can send this to your mychart (if signed up) or email for you to review.  Otherwise, I can obtain your verbal consent now.  All virtual visits are billed to your insurance company just like a normal visit would be.  By agreeing to a virtual visit, we'd like you to understand that the technology does not allow for your provider to perform an examination, and thus may limit your provider's ability to fully assess your condition. If your provider identifies any concerns that need to be evaluated in person, we will make arrangements to do so.  Finally, though the technology is pretty good, we cannot assure that it will always work on either your or our end, and in the setting of a video visit, we may have to convert it to a phone-only visit.  In either situation, we cannot ensure that we have a secure connection.  Are you willing to proceed?" STAFF: Did the patient verbally acknowledge consent to telehealth visit? Document  YES/NO here: YES 3. Advise patient to be prepared - "Two hours prior to your appointment, go ahead and check your blood pressure, pulse, oxygen saturation, and your weight (if you have the equipment to check those) and write them all down. When your visit starts, your provider will ask you for this information. If you have an Apple Watch or Kardia device, please plan to have heart rate information ready on the day of your appointment. Please have a pen and paper handy nearby the day of the visit as well."  4. Give patient instructions for MyChart download to smartphone OR Doximity/Doxy.me as below if video visit (depending on what platform provider is using)  5. Inform patient they will receive a phone call 15 minutes prior to their appointment time (may be from unknown caller ID) so they should be prepared to answer    TELEPHONE CALL NOTE  Adam Douglas has been deemed a candidate for a follow-up tele-health visit to limit community exposure during the Covid-19 pandemic. I spoke with the patient via phone to ensure availability of phone/video source, confirm preferred email & phone number, and discuss instructions and expectations.  I reminded Adam Douglas to be prepared with any vital sign and/or heart rhythm information that could potentially be obtained via home monitoring, at the time of his visit. I reminded Adam Douglas to expect a phone call prior to his visit.  Adam Douglas  03/14/2019 4:08 PM   INSTRUCTIONS FOR DOWNLOADING THE MYCHART APP TO SMARTPHONE  - The patient must first make sure to have activated MyChart and know their login information - If Apple, go to [employer] and type in MyChart in the search bar and download the app. If Android, ask patient to go to [employer] and type in [place] in the search bar and download the app. The app is free but as with any other app downloads, their phone may require them to verify saved payment information or Apple/Android  password.  - The patient will need to then log into the app with their MyChart username and password, and select [facility] as their healthcare provider to link the account. When it is time for your visit, go to the MyChart app, find appointments, and click Begin Video Visit. Be sure to Select Allow for your device to access the Microphone and Camera for your visit. You will then be connected, and your provider will be with you shortly.  **If they have any issues connecting, or need assistance please contact MyChart service desk [phone])**  **If using a computer, in order to ensure the best quality for their visit they will need to use either of the following Internet Browsers: [employer], or Google Chrome**  IF USING DOXIMITY or DOXY.ME - The patient will receive a link just prior to their visit by text.     FULL LENGTH CONSENT FOR TELE-HEALTH VISIT   I hereby voluntarily request, consent and authorize [facility] and its employed or contracted physicians, physician assistants, nurse practitioners or other licensed health care professionals (the Practitioner), to provide me with telemedicine health care services (the "Services") as deemed necessary by the treating Practitioner. I acknowledge and consent to receive the Services by the Practitioner via telemedicine. I understand that the telemedicine visit will involve communicating with the Practitioner through live audiovisual communication technology and the disclosure of certain medical information by electronic transmission. I acknowledge that I have been given the opportunity to request an in-person assessment or other available alternative prior to the telemedicine visit and am voluntarily participating in the telemedicine visit.  I understand that I have the right to withhold or withdraw my consent to the use of telemedicine in the course of my care at any time, without affecting my right to future care or treatment,  and that the Practitioner or I may terminate the telemedicine visit at any time. I understand that I have the right to inspect all information obtained and/or recorded in the course of the telemedicine visit and may receive copies of available information for a reasonable fee.  I understand that some of the potential risks of receiving the Services via telemedicine include:  Marland Kitchen Delay or interruption in medical evaluation due to technological equipment failure or disruption; . Information transmitted may not be sufficient (e.g. poor resolution of images) to allow for appropriate medical decision making by the Practitioner; and/or  . In rare instances, security protocols could fail, causing a breach of personal health information.  Furthermore, I acknowledge that it is my responsibility to provide information about my medical history, conditions and care that is complete and accurate to the best of my ability. I acknowledge that Practitioner's advice, recommendations, and/or decision may be based on factors not within their control, such as incomplete or inaccurate data provided by me or distortions of diagnostic images or specimens that may result from electronic transmissions. I understand that the practice of medicine  is not an Visual merchandiser and that Practitioner makes no warranties or guarantees regarding treatment outcomes. I acknowledge that I will receive a copy of this consent concurrently upon execution via email to the email address I last provided but may also request a printed copy by calling the office of [facility].    I understand that my insurance will be billed for this visit.   I have read or had this consent read to me. . I understand the contents of this consent, which adequately explains the benefits and risks of the Services being provided via telemedicine.  . I have been provided ample opportunity to ask questions regarding this consent and the Services and have had my questions  answered to my satisfaction. . I give my informed consent for the services to be provided through the use of telemedicine in my medical care  By participating in this telemedicine visit I agree to the above.

## 2019-03-14 NOTE — Telephone Encounter (Signed)
Spoke with patients wife per release form and I refaxed with confirmation received. Was able to find electric script location to send to and hoping they will get this processed. She was appreciative for the call with no further questions at this time.

## 2019-03-14 NOTE — Telephone Encounter (Signed)
States that company still has not received prescription request for [facility] and patient is out of medication  Patient spouse states it will need to be e-scribed to  [employer] [address] [place], [place] [address] Please call with any questions

## 2019-03-15 ENCOUNTER — Telehealth (INDEPENDENT_AMBULATORY_CARE_PROVIDER_SITE_OTHER): Payer: Medicare HMO | Admitting: Internal Medicine

## 2019-03-15 ENCOUNTER — Other Ambulatory Visit: Payer: Self-pay

## 2019-03-15 ENCOUNTER — Ambulatory Visit (INDEPENDENT_AMBULATORY_CARE_PROVIDER_SITE_OTHER): Payer: Medicare HMO

## 2019-03-15 VITALS — BP 162/78 | HR 72 | Wt 244.1 lb

## 2019-03-15 DIAGNOSIS — I4891 Unspecified atrial fibrillation: Secondary | ICD-10-CM

## 2019-03-15 DIAGNOSIS — I5022 Chronic systolic (congestive) heart failure: Secondary | ICD-10-CM | POA: Diagnosis not present

## 2019-03-15 DIAGNOSIS — I447 Left bundle-branch block, unspecified: Secondary | ICD-10-CM

## 2019-03-15 DIAGNOSIS — I255 Ischemic cardiomyopathy: Secondary | ICD-10-CM | POA: Diagnosis not present

## 2019-03-15 DIAGNOSIS — Z5181 Encounter for therapeutic drug level monitoring: Secondary | ICD-10-CM

## 2019-03-15 DIAGNOSIS — I110 Hypertensive heart disease with heart failure: Secondary | ICD-10-CM | POA: Diagnosis not present

## 2019-03-15 DIAGNOSIS — I69311 Memory deficit following cerebral infarction: Secondary | ICD-10-CM

## 2019-03-15 DIAGNOSIS — Z95810 Presence of automatic (implantable) cardiac defibrillator: Secondary | ICD-10-CM

## 2019-03-15 DIAGNOSIS — I428 Other cardiomyopathies: Secondary | ICD-10-CM

## 2019-03-15 DIAGNOSIS — D649 Anemia, unspecified: Secondary | ICD-10-CM

## 2019-03-15 DIAGNOSIS — I480 Paroxysmal atrial fibrillation: Secondary | ICD-10-CM | POA: Diagnosis not present

## 2019-03-15 LAB — POCT INR: INR: 1.5 — AB (ref 2.0–3.0)

## 2019-03-15 MED ORDER — FUROSEMIDE 40 MG PO TABS: ORAL_TABLET | ORAL | 0 refills | Status: DC

## 2019-03-15 NOTE — Patient Instructions (Signed)
-   Take 2 tablets warfarin tonight & tomorrow, - on Friday resume dosage of 5 mg every day except 1/2 TABLET on MONDAYS & FRIDAYS. - recheck in 3 weeks.
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
## 2019-03-16 NOTE — Patient Instructions (Signed)
Medication Instructions:  Change lasix to 40 mg For 7 days - take 40 mg daily, alternating with 80 mg every other day. After 7 days - stay on 40 mg daily  *If you need a refill on your cardiac medications before your next appointment, please call your pharmacy*   Lab Work: In 2 weeks BMET and CBC If you have labs (blood work) drawn today and your tests are completely normal, you will receive your results only by: Marland Kitchen MyChart Message (if you have MyChart) OR . A paper copy in the mail If you have any lab test that is abnormal or we need to change your treatment, we will call you to review the results.   Testing/Procedures: none   Follow-Up: At [facility], you and your health needs are our priority.  As part of our continuing mission to provide you with exceptional heart care, we have created designated Provider Care Teams.  These Care Teams include your primary Cardiologist (physician) and Advanced Practice Providers (APPs -  Physician Assistants and Nurse Practitioners) who all work together to provide you with the care you need, when you need it.  Your next appointment:   12 month(s)  The format for your next appointment:   Either In Person or Virtual  Provider:   Sherryl Manges, MD   Other Instructions Randon Goldsmith, RN will enroll yo in the [facility] clinic to monitor for fluid overload. Called and spoke with patient's wife and reviewed med adjustment and plan for lab work in 2 weeks and enrollment in [facility] clinic.  Pt will get labs in [place].

## 2019-03-17 ENCOUNTER — Ambulatory Visit (INDEPENDENT_AMBULATORY_CARE_PROVIDER_SITE_OTHER): Payer: Medicare HMO | Admitting: *Deleted

## 2019-03-17 DIAGNOSIS — I5022 Chronic systolic (congestive) heart failure: Secondary | ICD-10-CM | POA: Diagnosis not present

## 2019-03-17 LAB — CUP PACEART REMOTE DEVICE CHECK
Battery Remaining Longevity: 109 mo
Battery Voltage: 3.02 V
Brady Statistic AP VP Percent: 27.19 %
Brady Statistic AP VS Percent: 0.43 %
Brady Statistic AS VP Percent: 70.48 %
Brady Statistic AS VS Percent: 1.91 %
Brady Statistic RA Percent Paced: 27.55 %
Brady Statistic RV Percent Paced: 28.47 %
Date Time Interrogation Session: 20210312001803
HighPow Impedance: 75 Ohm
Implantable Lead Implant Date: 20200313
Implantable Lead Implant Date: 20200313
Implantable Lead Implant Date: 20200313
Implantable Lead Location: 753858
Implantable Lead Location: 753859
Implantable Lead Location: 753860
Implantable Lead Model: 4396
Implantable Lead Model: 5076
Implantable Lead Model: 6935
Implantable Pulse Generator Implant Date: 20200313
Lead Channel Impedance Value: 285 Ohm
Lead Channel Impedance Value: 361 Ohm
Lead Channel Impedance Value: 418 Ohm
Lead Channel Impedance Value: 418 Ohm
Lead Channel Impedance Value: 532 Ohm
Lead Channel Impedance Value: 779 Ohm
Lead Channel Pacing Threshold Amplitude: 0.75 V
Lead Channel Pacing Threshold Amplitude: 0.875 V
Lead Channel Pacing Threshold Amplitude: 1 V
Lead Channel Pacing Threshold Pulse Width: 0.4 ms
Lead Channel Pacing Threshold Pulse Width: 0.4 ms
Lead Channel Pacing Threshold Pulse Width: 0.4 ms
Lead Channel Sensing Intrinsic Amplitude: 11.875 mV
Lead Channel Sensing Intrinsic Amplitude: 11.875 mV
Lead Channel Sensing Intrinsic Amplitude: 2 mV
Lead Channel Sensing Intrinsic Amplitude: 2 mV
Lead Channel Setting Pacing Amplitude: 1.5 V
Lead Channel Setting Pacing Amplitude: 1.5 V
Lead Channel Setting Pacing Amplitude: 2 V
Lead Channel Setting Pacing Pulse Width: 0.4 ms
Lead Channel Setting Pacing Pulse Width: 0.4 ms
Lead Channel Setting Sensing Sensitivity: 0.3 mV

## 2019-03-17 NOTE — Progress Notes (Signed)
ICD Remote  

## 2019-03-27 ENCOUNTER — Telehealth: Payer: Self-pay

## 2019-03-27 NOTE — Telephone Encounter (Signed)
Referred to [facility] clinic by Dr Graciela Husbands at 03/15/2019 telehealth visit due to patient was volume overloaded and Furosemide was adjusted.  Spoke with wife, DPR on file.  Patient was not home but she normally speaks with physicians office regarding patients health.  Explained reason for call and patient will be home by 4:30 and asked for call back.  She reports patient lost 8 lbs after Furosemide increased x 7 days and he is taking 40 mg daily now.  She thinks he may still holding some fluid.  Will call back and assist with sending remote transmission.
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
## 2019-03-31 NOTE — Telephone Encounter (Signed)
Noted  

## 2019-04-05 ENCOUNTER — Other Ambulatory Visit (INDEPENDENT_AMBULATORY_CARE_PROVIDER_SITE_OTHER): Payer: Medicare HMO

## 2019-04-05 ENCOUNTER — Other Ambulatory Visit: Payer: Self-pay

## 2019-04-05 ENCOUNTER — Ambulatory Visit (INDEPENDENT_AMBULATORY_CARE_PROVIDER_SITE_OTHER): Payer: Medicare HMO

## 2019-04-05 DIAGNOSIS — Z5181 Encounter for therapeutic drug level monitoring: Secondary | ICD-10-CM | POA: Diagnosis not present

## 2019-04-05 DIAGNOSIS — I480 Paroxysmal atrial fibrillation: Secondary | ICD-10-CM | POA: Diagnosis not present

## 2019-04-05 DIAGNOSIS — I5022 Chronic systolic (congestive) heart failure: Secondary | ICD-10-CM

## 2019-04-05 LAB — POCT INR: INR: 2.5 (ref 2.0–3.0)

## 2019-04-05 NOTE — Patient Instructions (Signed)
-  continue warfarin dosage of 5 mg every day except 1/2 TABLET on MONDAYS & FRIDAYS. - recheck in 4 weeks.

## 2019-04-06 LAB — CBC WITH DIFFERENTIAL/PLATELET
Basophils Absolute: 0 10*3/uL (ref 0.0–0.2)
Basos: 1 %
EOS (ABSOLUTE): 0.2 10*3/uL (ref 0.0–0.4)
Eos: 2 %
Hematocrit: 36.9 % — ABNORMAL LOW (ref 37.5–51.0)
Hemoglobin: 12.5 g/dL — ABNORMAL LOW (ref 13.0–17.7)
Immature Grans (Abs): 0 10*3/uL (ref 0.0–0.1)
Immature Granulocytes: 0 %
Lymphocytes Absolute: 2.1 10*3/uL (ref 0.7–3.1)
Lymphs: 25 %
MCH: 31.3 pg (ref 26.6–33.0)
MCHC: 33.9 g/dL (ref 31.5–35.7)
MCV: 92 fL (ref 79–97)
Monocytes Absolute: 0.6 10*3/uL (ref 0.1–0.9)
Monocytes: 7 %
Neutrophils Absolute: 5.6 10*3/uL (ref 1.4–7.0)
Neutrophils: 65 %
Platelets: 157 10*3/uL (ref 150–450)
RBC: 4 x10E6/uL — ABNORMAL LOW (ref 4.14–5.80)
RDW: 13 % (ref 11.6–15.4)
WBC: 8.6 10*3/uL (ref 3.4–10.8)

## 2019-04-06 LAB — BASIC METABOLIC PANEL
BUN/Creatinine Ratio: 21 (ref 10–24)
BUN: 21 mg/dL (ref 8–27)
CO2: 19 mmol/L — ABNORMAL LOW (ref 20–29)
Calcium: 8 mg/dL — ABNORMAL LOW (ref 8.6–10.2)
Chloride: 107 mmol/L — ABNORMAL HIGH (ref 96–106)
Creatinine, Ser: 1.02 mg/dL (ref 0.76–1.27)
GFR calc Af Amer: 86 mL/min/{1.73_m2} (ref >59)
GFR calc non Af Amer: 75 mL/min/{1.73_m2} (ref >59)
Glucose: 260 mg/dL — ABNORMAL HIGH (ref 65–99)
Potassium: 3.7 mmol/L (ref 3.5–5.2)
Sodium: 141 mmol/L (ref 134–144)

## 2019-04-10 ENCOUNTER — Ambulatory Visit (INDEPENDENT_AMBULATORY_CARE_PROVIDER_SITE_OTHER): Payer: Medicare HMO

## 2019-04-10 DIAGNOSIS — Z95810 Presence of automatic (implantable) cardiac defibrillator: Secondary | ICD-10-CM

## 2019-04-10 DIAGNOSIS — I5022 Chronic systolic (congestive) heart failure: Secondary | ICD-10-CM | POA: Diagnosis not present

## 2019-04-11 NOTE — Progress Notes (Signed)
EPIC Encounter for ICM Monitoring  Patient Name: Adam Douglas is a 69 y.o. male Date: 04/11/2019 Primary Care Physican: Etheleen Nicks, NP Primary Cardiologist: Mariah Milling Electrophysiologist: Joycelyn Schmid Pacing:   95.9%  04/11/2019 Weight: 231-235 lbs        Heart Failure questions reviewed with wife.  Pt asymptomatic.   Optivol thoracic impedance normal.   Taking Furosemide 40 mg take 1 tablet daily.  Recommendations: Reinforced limiting salt intake to < 2000 mg daily and fluid intake to 64 oz daily.  Encouraged to call if experiencing fluid symptoms.  Follow-up plan: ICM clinic phone appointment on 05/15/2019.   91 day device clinic remote transmission 06/16/2019.    Copy of ICM check sent to Dr. Graciela Husbands.   3 month ICM trend: 04/10/2019    1 Year ICM trend:       Karie Soda, RN 04/11/2019 2:27 PM

## 2019-04-14 ENCOUNTER — Telehealth: Payer: Self-pay

## 2019-04-14 NOTE — Telephone Encounter (Signed)
Attempted phone call to pt.  Call went to voicemail.  Message states voicemail is full and unable to leave voicemail message.

## 2019-04-14 NOTE — Telephone Encounter (Signed)
-----   Message from Duke Salvia, MD sent at 04/12/2019  7:50 PM EDT ----- Please Inform Patient  Labs are normal x BS elevated but also Calcium down   we should check plz ionized CA Thanks

## 2019-04-24 ENCOUNTER — Other Ambulatory Visit: Payer: Self-pay

## 2019-05-03 ENCOUNTER — Other Ambulatory Visit: Payer: Self-pay

## 2019-05-03 ENCOUNTER — Other Ambulatory Visit: Payer: Self-pay | Admitting: *Deleted

## 2019-05-03 ENCOUNTER — Other Ambulatory Visit (INDEPENDENT_AMBULATORY_CARE_PROVIDER_SITE_OTHER): Payer: Medicare HMO

## 2019-05-03 ENCOUNTER — Ambulatory Visit (INDEPENDENT_AMBULATORY_CARE_PROVIDER_SITE_OTHER): Payer: Medicare HMO

## 2019-05-03 DIAGNOSIS — I480 Paroxysmal atrial fibrillation: Secondary | ICD-10-CM | POA: Diagnosis not present

## 2019-05-03 DIAGNOSIS — Z5181 Encounter for therapeutic drug level monitoring: Secondary | ICD-10-CM

## 2019-05-03 LAB — POCT INR: INR: 2 (ref 2.0–3.0)

## 2019-05-03 NOTE — Patient Instructions (Signed)
-  continue warfarin dosage of 5 mg every day except 1/2 TABLET on MONDAYS & FRIDAYS. - recheck in 5 weeks.

## 2019-05-04 LAB — CALCIUM, IONIZED: Calcium, Ion: 4.7 mg/dL (ref 4.5–5.6)

## 2019-05-15 ENCOUNTER — Ambulatory Visit (INDEPENDENT_AMBULATORY_CARE_PROVIDER_SITE_OTHER): Payer: Medicare HMO

## 2019-05-15 DIAGNOSIS — I5022 Chronic systolic (congestive) heart failure: Secondary | ICD-10-CM | POA: Diagnosis not present

## 2019-05-15 DIAGNOSIS — Z95810 Presence of automatic (implantable) cardiac defibrillator: Secondary | ICD-10-CM

## 2019-05-16 NOTE — Progress Notes (Signed)
EPIC Encounter for ICM Monitoring  Patient Name: Adam Douglas is a 69 y.o. male Date: 05/16/2019 Primary Care Physican: Etheleen Nicks, NP Primary Cardiologist: Mariah Milling Electrophysiologist: Joycelyn Schmid Pacing: 95.8%  05/16/2019 Weight:  unknown       1st ICM remote transmission.  Spoke with wife per DPR.  She said patient is not following a low salt diet.  Wife reports patient's weight is up (unsure how much), swelling of, ankles and Adam Douglas of breath.  She is upset and concerned that patient's personality is changing and he seems more agitated with her.  She said he does not seem to care about his health.   Transmission reviewed with wife and advised of possible fluid accumulation.    Optivol thoracic impedance suggesting possible fluid accumulation starting 05/11/19.   Prescribed:  Furosemide 40 mg take 1 tablet daily  Labs: 04/05/2019 Creatinine 1.02, BUN 21, Potassium 3.7, Sodium 141, GFR 75-86  Recommendations: Advised to take Furosemide 2 tablets daily x 2 days and then return to 1 tablet daily.  She said she is not sure he will take the extra dosage but confirmed he is taking all his medications as prescribed at this time.  Offered to have SW contact wife to discuss her concerns since she was upset during call today and she declined.  Follow-up plan: ICM clinic phone appointment on 05/23/2019 to recheck fluid levels.    91 day device clinic remote transmission 06/16/2019.     Copy of ICM check sent to Dr. Graciela Husbands and Dr Mariah Milling.   3 month ICM trend: 05/15/2019    1 Year ICM trend:       Karie Soda, RN 05/16/2019 9:31 AM

## 2019-05-23 ENCOUNTER — Ambulatory Visit (INDEPENDENT_AMBULATORY_CARE_PROVIDER_SITE_OTHER): Payer: Medicare HMO

## 2019-05-23 DIAGNOSIS — Z95810 Presence of automatic (implantable) cardiac defibrillator: Secondary | ICD-10-CM

## 2019-05-23 DIAGNOSIS — I5022 Chronic systolic (congestive) heart failure: Secondary | ICD-10-CM

## 2019-05-26 NOTE — Progress Notes (Signed)
EPIC Encounter for ICM Monitoring  Patient Name: Adam Douglas is a 69 y.o. male Date: 05/26/2019 Primary Care Physican: Etheleen Nicks, NP Primary Cardiologist: Mariah Milling Electrophysiologist: Joycelyn Schmid Pacing: 95.8%         05/26/2019 Weight: 236 lbs                                                           Spoke with wife per DPR.  She said fluid symptoms have resolved and weight is stable at 236 lbs.   Optivol thoracic impedance returned to normal after taking extra Furosemide.  Prescribed:  Furosemide 40 mg take 1 tablet daily  Labs: 04/05/2019 Creatinine 1.02, BUN 21, Potassium 3.7, Sodium 141, GFR 75-86  Recommendations: No changes and encouraged to call if experiencing any fluid symptoms.  Follow-up plan: ICM clinic phone appointment on 06/26/2019.    91 day device clinic remote transmission 06/16/2019.     Copy of ICM check sent to Dr. Graciela Husbands.   3 month ICM trend: 05/23/2019    1 Year ICM trend:       Karie Soda, RN 05/26/2019 1:32 PM

## 2019-06-07 ENCOUNTER — Ambulatory Visit (INDEPENDENT_AMBULATORY_CARE_PROVIDER_SITE_OTHER): Payer: Medicare HMO

## 2019-06-07 ENCOUNTER — Other Ambulatory Visit: Payer: Self-pay

## 2019-06-07 DIAGNOSIS — Z5181 Encounter for therapeutic drug level monitoring: Secondary | ICD-10-CM | POA: Diagnosis not present

## 2019-06-07 DIAGNOSIS — I480 Paroxysmal atrial fibrillation: Secondary | ICD-10-CM | POA: Diagnosis not present

## 2019-06-07 LAB — POCT INR: INR: 2.9 (ref 2.0–3.0)

## 2019-06-07 NOTE — Patient Instructions (Signed)
-  continue warfarin dosage of 5 mg every day except 1/2 TABLET on MONDAYS & FRIDAYS. - recheck in 6 weeks.  

## 2019-06-14 ENCOUNTER — Other Ambulatory Visit: Payer: Self-pay | Admitting: Internal Medicine

## 2019-06-15 NOTE — Telephone Encounter (Signed)
This is a [facility] pt 

## 2019-06-16 ENCOUNTER — Ambulatory Visit (INDEPENDENT_AMBULATORY_CARE_PROVIDER_SITE_OTHER): Payer: Medicare HMO | Admitting: *Deleted

## 2019-06-16 DIAGNOSIS — I5022 Chronic systolic (congestive) heart failure: Secondary | ICD-10-CM

## 2019-06-16 DIAGNOSIS — I255 Ischemic cardiomyopathy: Secondary | ICD-10-CM

## 2019-06-16 LAB — CUP PACEART REMOTE DEVICE CHECK
Battery Remaining Longevity: 102 mo
Battery Voltage: 3.01 V
Brady Statistic AP VP Percent: 29.75 %
Brady Statistic AP VS Percent: 0.46 %
Brady Statistic AS VP Percent: 68.1 %
Brady Statistic AS VS Percent: 1.69 %
Brady Statistic RA Percent Paced: 30.02 %
Brady Statistic RV Percent Paced: 29.32 %
Date Time Interrogation Session: 20210611033323
HighPow Impedance: 65 Ohm
Implantable Lead Implant Date: 20200313
Implantable Lead Implant Date: 20200313
Implantable Lead Implant Date: 20200313
Implantable Lead Location: 753858
Implantable Lead Location: 753859
Implantable Lead Location: 753860
Implantable Lead Model: 4396
Implantable Lead Model: 5076
Implantable Lead Model: 6935
Implantable Pulse Generator Implant Date: 20200313
Lead Channel Impedance Value: 285 Ohm
Lead Channel Impedance Value: 361 Ohm
Lead Channel Impedance Value: 399 Ohm
Lead Channel Impedance Value: 399 Ohm
Lead Channel Impedance Value: 418 Ohm
Lead Channel Impedance Value: 779 Ohm
Lead Channel Pacing Threshold Amplitude: 0.75 V
Lead Channel Pacing Threshold Amplitude: 0.875 V
Lead Channel Pacing Threshold Amplitude: 1.125 V
Lead Channel Pacing Threshold Pulse Width: 0.4 ms
Lead Channel Pacing Threshold Pulse Width: 0.4 ms
Lead Channel Pacing Threshold Pulse Width: 0.4 ms
Lead Channel Sensing Intrinsic Amplitude: 10.25 mV
Lead Channel Sensing Intrinsic Amplitude: 10.25 mV
Lead Channel Sensing Intrinsic Amplitude: 2.5 mV
Lead Channel Sensing Intrinsic Amplitude: 2.5 mV
Lead Channel Setting Pacing Amplitude: 1.5 V
Lead Channel Setting Pacing Amplitude: 2 V
Lead Channel Setting Pacing Amplitude: 2.25 V
Lead Channel Setting Pacing Pulse Width: 0.4 ms
Lead Channel Setting Pacing Pulse Width: 0.4 ms
Lead Channel Setting Sensing Sensitivity: 0.3 mV

## 2019-06-16 NOTE — Telephone Encounter (Signed)
Please review for refill. Thanks!  

## 2019-06-16 NOTE — Progress Notes (Signed)
Remote ICD transmission.   

## 2019-06-26 ENCOUNTER — Ambulatory Visit (INDEPENDENT_AMBULATORY_CARE_PROVIDER_SITE_OTHER): Payer: Medicare HMO

## 2019-06-26 DIAGNOSIS — Z95810 Presence of automatic (implantable) cardiac defibrillator: Secondary | ICD-10-CM | POA: Diagnosis not present

## 2019-06-26 DIAGNOSIS — I5022 Chronic systolic (congestive) heart failure: Secondary | ICD-10-CM

## 2019-06-28 NOTE — Progress Notes (Signed)
EPIC Encounter for ICM Monitoring  Patient Name: Adam Douglas is a 69 y.o. male Date: 06/28/2019 Primary Care Physican: Etheleen Nicks, NP Primary Cardiologist:Gollan Electrophysiologist:Klein Bi-V Pacing:97.2% 05/26/2019 Weight: 236 lbs   Transmission reviewed.  Optivol thoracic impedancenormal.  Prescribed: Furosemide 40 mg take 1 tablet daily  Labs: 04/05/2019 Creatinine 1.02, BUN 21, Potassium 3.7, Sodium 141, GFR 75-86  Recommendations:None.  Follow-up plan: ICM clinic phone appointment on7/26/2021. 91 day device clinic remote transmission 09/15/2019.   Copy of ICM check sent to Dr.Klein.   3 month ICM trend: 06/26/2019    1 Year ICM trend:       Karie Soda, RN 06/28/2019 11:41 AM

## 2019-07-13 ENCOUNTER — Other Ambulatory Visit: Payer: Self-pay | Admitting: Internal Medicine

## 2019-07-13 NOTE — Telephone Encounter (Signed)
*  STAT* If patient is at the pharmacy, call can be transferred to refill team.   1. Which medications need to be refilled? (please list name of each medication and dose if known) Nitroglycerin  2. Which pharmacy/location (including street and city if local pharmacy) is medication to be sent to? [facility] [place]  3. Do they need a 30 day or 90 day supply?

## 2019-07-19 ENCOUNTER — Ambulatory Visit (INDEPENDENT_AMBULATORY_CARE_PROVIDER_SITE_OTHER): Payer: Medicare HMO

## 2019-07-19 ENCOUNTER — Other Ambulatory Visit: Payer: Self-pay

## 2019-07-19 DIAGNOSIS — Z5181 Encounter for therapeutic drug level monitoring: Secondary | ICD-10-CM | POA: Diagnosis not present

## 2019-07-19 DIAGNOSIS — I480 Paroxysmal atrial fibrillation: Secondary | ICD-10-CM

## 2019-07-19 LAB — POCT INR: INR: 2.9 (ref 2.0–3.0)

## 2019-07-19 NOTE — Patient Instructions (Signed)
-  continue warfarin dosage of 5 mg every day except 1/2 TABLET on MONDAYS & FRIDAYS. - recheck in 6 weeks.

## 2019-07-31 ENCOUNTER — Ambulatory Visit (INDEPENDENT_AMBULATORY_CARE_PROVIDER_SITE_OTHER): Payer: Medicare HMO

## 2019-07-31 DIAGNOSIS — Z95810 Presence of automatic (implantable) cardiac defibrillator: Secondary | ICD-10-CM | POA: Diagnosis not present

## 2019-07-31 DIAGNOSIS — I5022 Chronic systolic (congestive) heart failure: Secondary | ICD-10-CM | POA: Diagnosis not present

## 2019-08-02 NOTE — Progress Notes (Signed)
EPIC Encounter for ICM Monitoring  Patient Name: Adam Douglas is a 69 y.o. male Date: 08/02/2019 Primary Care Physican: Etheleen Nicks, NP Primary Cardiologist:Gollan Electrophysiologist:Klein Bi-V Pacing:97.5% 05/26/2019 Weight:236 lbs   Transmission reviewed.  Optivol thoracic impedancenormal.  Prescribed: Furosemide 40 mg take 1 tablet daily  Labs: 04/05/2019 Creatinine 1.02, BUN 21, Potassium 3.7, Sodium 141, GFR 75-86  Recommendations:None.  Follow-up plan: ICM clinic phone appointment on 09/04/2019. 91 day device clinic remote transmission 09/15/2019.   Copy of ICM check sent to Dr.Klein.   3 month ICM trend: 07/31/2019    1 Year ICM trend:       Karie Soda, RN 08/02/2019 4:31 PM

## 2019-08-19 IMAGING — DX DG FINGER THUMB 2+V*L*
3 series · 3 of 3 positions shown · non-contrast
Comparison: None.

CLINICAL DATA: Pain following laceration

EXAM:
LEFT THUMB 2+V

[finger ap]
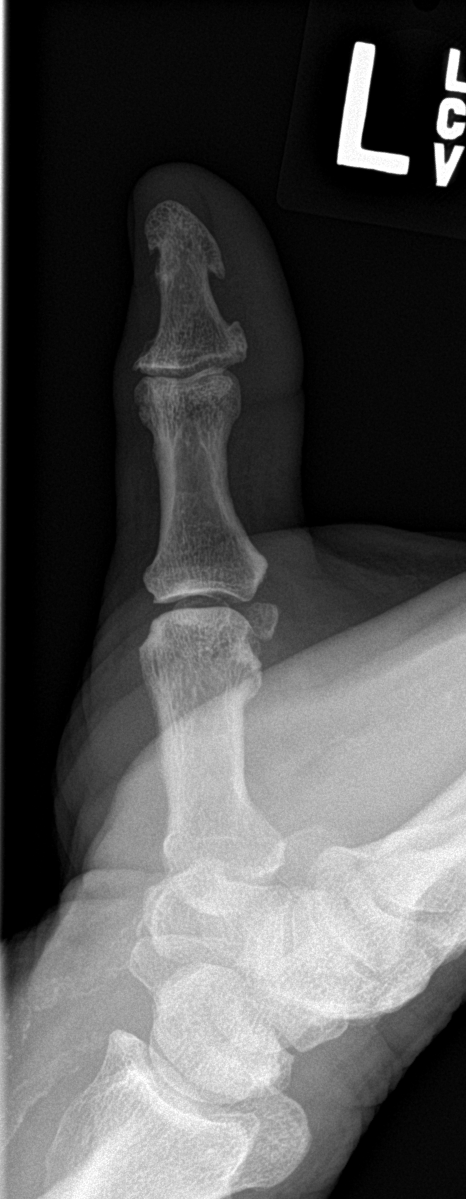

[finger obl]
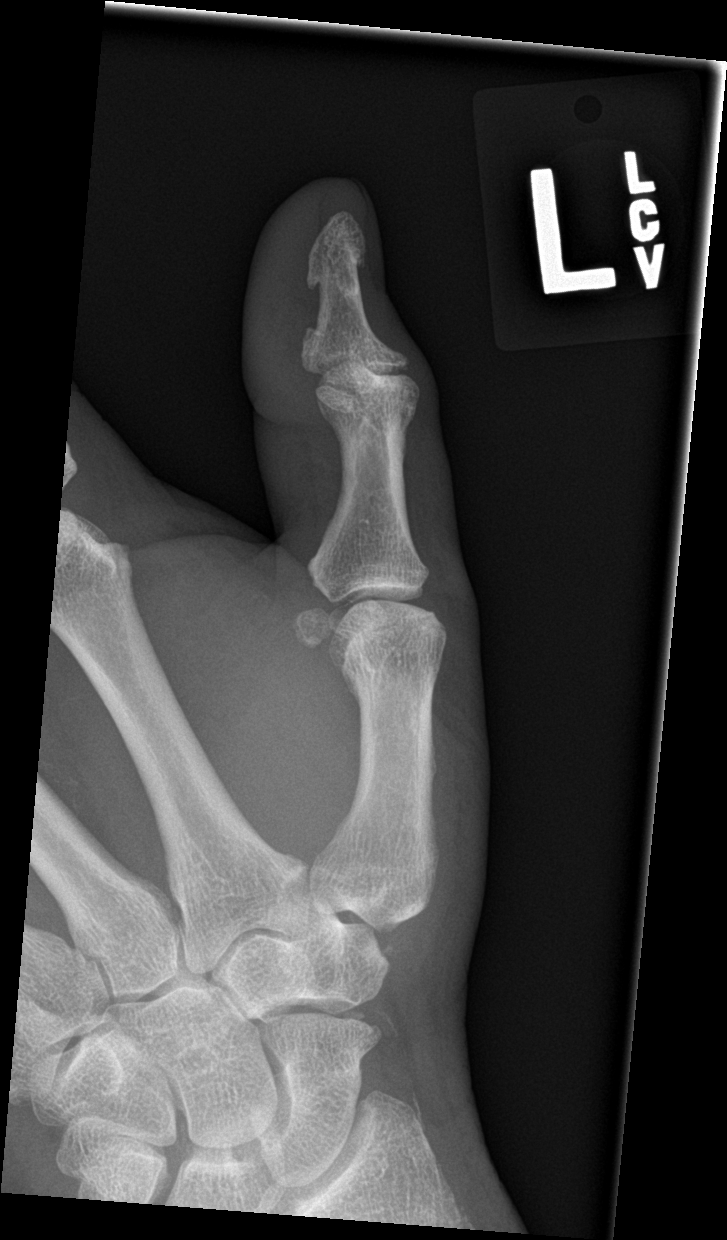

[finger lat]
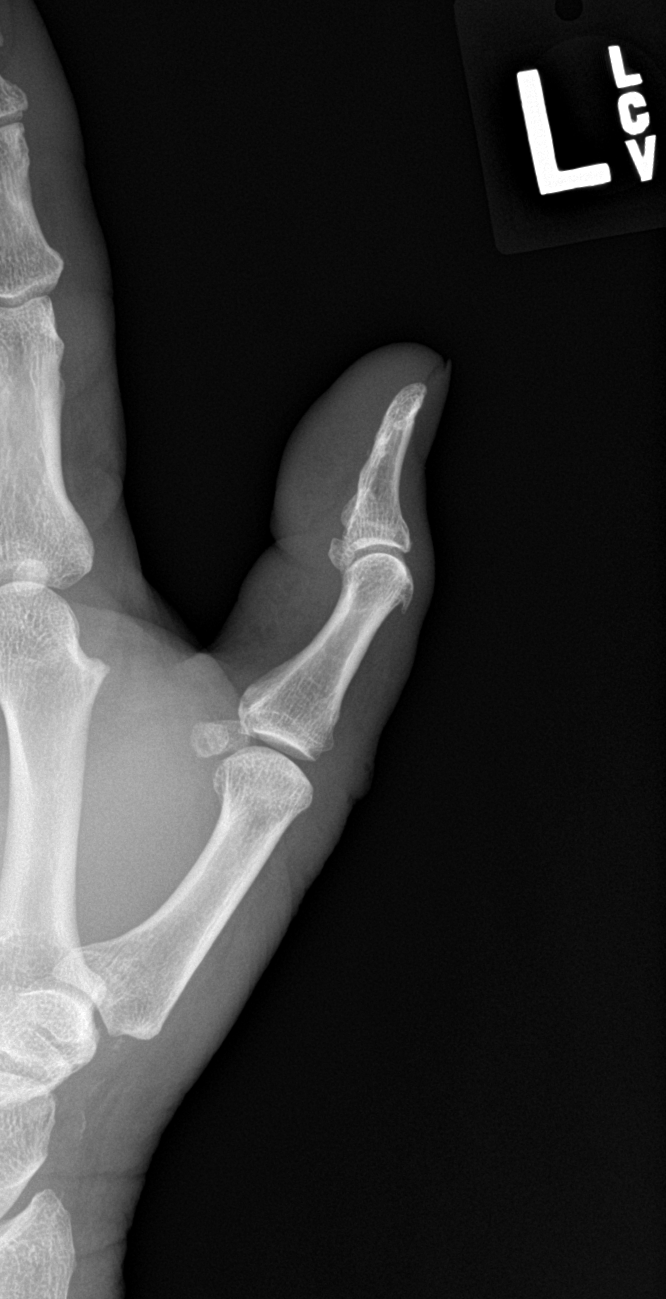

[3 of 3 positions shown; findings below may reference images not displayed]

FINDINGS: Frontal, oblique, and lateral views were obtained. No fracture or
dislocation. There is slight osteoarthritic change in the first IP
joint. Other joint spaces appear normal. No erosive change. No
radiopaque foreign body. No appreciable soft tissue air. There is
arterial vascular calcification in the wrist region.
IMPRESSION: Mild osteoarthritic change first IP joint. No fracture or
dislocation. No radiopaque foreign body or soft tissue air evident.
Arterial vascular calcifications/atherosclerosis in the wrist
region.

## 2019-09-04 ENCOUNTER — Ambulatory Visit (INDEPENDENT_AMBULATORY_CARE_PROVIDER_SITE_OTHER): Payer: Medicare HMO

## 2019-09-04 ENCOUNTER — Other Ambulatory Visit: Payer: Self-pay

## 2019-09-04 DIAGNOSIS — E119 Type 2 diabetes mellitus without complications: Secondary | ICD-10-CM | POA: Diagnosis not present

## 2019-09-04 DIAGNOSIS — I25118 Atherosclerotic heart disease of native coronary artery with other forms of angina pectoris: Secondary | ICD-10-CM | POA: Diagnosis not present

## 2019-09-04 DIAGNOSIS — I214 Non-ST elevation (NSTEMI) myocardial infarction: Secondary | ICD-10-CM | POA: Diagnosis not present

## 2019-09-04 DIAGNOSIS — I5022 Chronic systolic (congestive) heart failure: Secondary | ICD-10-CM | POA: Diagnosis not present

## 2019-09-04 DIAGNOSIS — I480 Paroxysmal atrial fibrillation: Secondary | ICD-10-CM

## 2019-09-04 DIAGNOSIS — Z95810 Presence of automatic (implantable) cardiac defibrillator: Secondary | ICD-10-CM

## 2019-09-04 DIAGNOSIS — F419 Anxiety disorder, unspecified: Secondary | ICD-10-CM | POA: Diagnosis not present

## 2019-09-04 DIAGNOSIS — Z5181 Encounter for therapeutic drug level monitoring: Secondary | ICD-10-CM

## 2019-09-04 DIAGNOSIS — Z794 Long term (current) use of insulin: Secondary | ICD-10-CM

## 2019-09-04 LAB — POCT INR: INR: 2.5 (ref 2.0–3.0)

## 2019-09-04 NOTE — Patient Instructions (Signed)
-  continue warfarin dosage of 5 mg every day except 1/2 TABLET on MONDAYS & FRIDAYS. - recheck in 6 weeks.  

## 2019-09-05 ENCOUNTER — Telehealth: Payer: Self-pay

## 2019-09-05 NOTE — Telephone Encounter (Signed)
Remote ICM transmission received.  Attempted call to wife regarding ICM remote transmission and no answer. 

## 2019-09-05 NOTE — Progress Notes (Signed)
EPIC Encounter for ICM Monitoring  Patient Name: Adam Douglas is a 69 y.o. male Date: 09/05/2019 Primary Care Physican: Etheleen Nicks, NP Primary Cardiologist:Gollan Electrophysiologist:Klein Bi-V Pacing:97.5% 05/26/2019 Weight:236 lbs   Attempted call to wife and unable to reach.  Transmission reviewed.   Optivol thoracic impedancenormal.  Prescribed: Furosemide 40 mg Take 1 tablet daily  Labs: 04/05/2019 Creatinine 1.02, BUN 21, Potassium 3.7, Sodium 141, GFR 75-86  Recommendations:Unable to reach.    Follow-up plan: ICM clinic phone appointment on 10/09/2019. 91 day device clinic remote transmission9/10/2019.   Copy of ICM check sent to Dr.Klein.   3 month ICM trend: 09/05/2019    1 Year ICM trend:       Karie Soda, RN 09/05/2019 4:54 PM

## 2019-09-08 ENCOUNTER — Other Ambulatory Visit: Payer: Self-pay | Admitting: Cardiovascular Disease

## 2019-09-08 ENCOUNTER — Other Ambulatory Visit: Payer: Self-pay | Admitting: Internal Medicine

## 2019-09-08 MED ORDER — WARFARIN SODIUM 5 MG PO TABS: ORAL_TABLET | ORAL | 0 refills | Status: DC

## 2019-09-08 NOTE — Telephone Encounter (Signed)
°*  STAT* If patient is at the pharmacy, call can be transferred to refill team.   1. Which medications need to be refilled? (please list name of each medication and dose if known)  Lasix 40 mg  2. Which pharmacy/location (including street and city if local pharmacy) is medication to be sent [facility]? [facility] [address]  3. Do they need a 30 day or 90 day supply? 90 day

## 2019-09-08 NOTE — Telephone Encounter (Signed)
This is a Educational psychologist pt, Dr. Graciela Husbands sees pt at the [place] office. Please address

## 2019-09-08 NOTE — Telephone Encounter (Signed)
Refill request

## 2019-09-08 NOTE — Telephone Encounter (Signed)
*  STAT* If patient is at the pharmacy, call can be transferred to refill team.   1. Which medications need to be refilled? (please list name of each medication and dose if known)  Warfarin (COUMADIN) 5 mg  2. Which pharmacy/location (including street and city if local pharmacy) is medication to be sent to? [facility] on [address]  3. Do they need a 30 day or 90 day supply? 90 day if possible

## 2019-09-08 NOTE — Telephone Encounter (Signed)
This is a Educational psychologist pt, Dr. Graciela Husbands sees this pt at the [place] office. Please address

## 2019-09-12 ENCOUNTER — Telehealth: Payer: Self-pay | Admitting: Internal Medicine

## 2019-09-12 NOTE — Telephone Encounter (Signed)
MyChart message sent via pt's wife Lynden Ang on 09/06/2021re: pt having edema of face ,eyes and legs despite Furosemide and Spironolactone .  Attempted phone call to pt and left voicemail message to contact RN at [phone].

## 2019-09-12 NOTE — Telephone Encounter (Signed)
Patient's wife is returning nurse's call. Call transferred to [facility]

## 2019-09-12 NOTE — Telephone Encounter (Signed)
Attempted return phone call to pt's wife.  Left voicemail message to contact RN at [phone].

## 2019-09-12 NOTE — Telephone Encounter (Signed)
Follow up  Pt's wife returning call from [facility], she said to let the phone ring sometimes she takes time to get to the phone.

## 2019-09-13 NOTE — Telephone Encounter (Signed)
Attempted phone call to pt and left message to contact RN at [phone]. 

## 2019-09-15 ENCOUNTER — Ambulatory Visit (INDEPENDENT_AMBULATORY_CARE_PROVIDER_SITE_OTHER): Payer: Medicare HMO | Admitting: *Deleted

## 2019-09-15 DIAGNOSIS — I255 Ischemic cardiomyopathy: Secondary | ICD-10-CM | POA: Diagnosis not present

## 2019-09-15 LAB — CUP PACEART REMOTE DEVICE CHECK
Battery Remaining Longevity: 99 mo
Battery Voltage: 3 V
Brady Statistic AP VP Percent: 28.59 %
Brady Statistic AP VS Percent: 0.43 %
Brady Statistic AS VP Percent: 69.68 %
Brady Statistic AS VS Percent: 1.3 %
Brady Statistic RA Percent Paced: 28.97 %
Brady Statistic RV Percent Paced: 28.06 %
Date Time Interrogation Session: 20210910063526
HighPow Impedance: 69 Ohm
Implantable Lead Implant Date: 20200313
Implantable Lead Implant Date: 20200313
Implantable Lead Implant Date: 20200313
Implantable Lead Location: 753858
Implantable Lead Location: 753859
Implantable Lead Location: 753860
Implantable Lead Model: 4396
Implantable Lead Model: 5076
Implantable Lead Model: 6935
Implantable Pulse Generator Implant Date: 20200313
Lead Channel Impedance Value: 285 Ohm
Lead Channel Impedance Value: 342 Ohm
Lead Channel Impedance Value: 399 Ohm
Lead Channel Impedance Value: 456 Ohm
Lead Channel Impedance Value: 456 Ohm
Lead Channel Impedance Value: 817 Ohm
Lead Channel Pacing Threshold Amplitude: 0.625 V
Lead Channel Pacing Threshold Amplitude: 0.875 V
Lead Channel Pacing Threshold Amplitude: 1.375 V
Lead Channel Pacing Threshold Pulse Width: 0.4 ms
Lead Channel Pacing Threshold Pulse Width: 0.4 ms
Lead Channel Pacing Threshold Pulse Width: 0.4 ms
Lead Channel Sensing Intrinsic Amplitude: 1.875 mV
Lead Channel Sensing Intrinsic Amplitude: 1.875 mV
Lead Channel Sensing Intrinsic Amplitude: 8.125 mV
Lead Channel Sensing Intrinsic Amplitude: 8.125 mV
Lead Channel Setting Pacing Amplitude: 1.5 V
Lead Channel Setting Pacing Amplitude: 2 V
Lead Channel Setting Pacing Amplitude: 2 V
Lead Channel Setting Pacing Pulse Width: 0.4 ms
Lead Channel Setting Pacing Pulse Width: 0.4 ms
Lead Channel Setting Sensing Sensitivity: 0.3 mV

## 2019-09-15 NOTE — Progress Notes (Signed)
Remote ICD transmission.   

## 2019-10-09 ENCOUNTER — Ambulatory Visit (INDEPENDENT_AMBULATORY_CARE_PROVIDER_SITE_OTHER): Payer: Medicare HMO

## 2019-10-09 DIAGNOSIS — Z95810 Presence of automatic (implantable) cardiac defibrillator: Secondary | ICD-10-CM | POA: Diagnosis not present

## 2019-10-09 DIAGNOSIS — I5022 Chronic systolic (congestive) heart failure: Secondary | ICD-10-CM

## 2019-10-10 NOTE — Progress Notes (Signed)
EPIC Encounter for ICM Monitoring  Patient Name: Adam Douglas is a 69 y.o. male Date: 10/10/2019 Primary Care Physican: Etheleen Nicks, NP Primary Cardiologist:Gollan Electrophysiologist:Klein Bi-V Pacing:98% 05/26/2019 Weight:236 lbs   Transmission reviewed.   Optivol thoracic impedancenormal.  Prescribed: Furosemide 40 mg Take 1 tablet daily  Labs: 04/05/2019 Creatinine 1.02, BUN 21, Potassium 3.7, Sodium 141, GFR 75-86  Recommendations:No changes    Follow-up plan: ICM clinic phone appointment on11/08/2019. 91 day device clinic remote transmission12/10/2019.   Copy of ICM check sent to Dr.Klein.   3 month ICM trend: 10/09/2019    1 Year ICM trend:       Karie Soda, RN 10/10/2019 4:16 PM

## 2019-10-16 ENCOUNTER — Other Ambulatory Visit: Payer: Self-pay

## 2019-10-16 ENCOUNTER — Ambulatory Visit (INDEPENDENT_AMBULATORY_CARE_PROVIDER_SITE_OTHER): Payer: Medicare HMO

## 2019-10-16 DIAGNOSIS — Z5181 Encounter for therapeutic drug level monitoring: Secondary | ICD-10-CM | POA: Diagnosis not present

## 2019-10-16 DIAGNOSIS — I480 Paroxysmal atrial fibrillation: Secondary | ICD-10-CM | POA: Diagnosis not present

## 2019-10-16 LAB — POCT INR: INR: 3.2 — AB (ref 2.0–3.0)

## 2019-10-16 NOTE — Patient Instructions (Signed)
-   skip warfarin tonight, then  -continue warfarin dosage of 5 mg every day except 1/2 TABLET on MONDAYS & FRIDAYS. - recheck in 5 weeks.

## 2019-10-18 ENCOUNTER — Other Ambulatory Visit: Payer: Self-pay | Admitting: *Deleted

## 2019-10-18 MED ORDER — CARVEDILOL 12.5 MG PO TABS: 12.5000 mg | ORAL_TABLET | Freq: Two times a day (BID) | ORAL | 0 refills | Status: DC

## 2019-10-18 MED ORDER — SPIRONOLACTONE 25 MG PO TABS: 12.5000 mg | ORAL_TABLET | Freq: Every day | ORAL | 0 refills | Status: DC

## 2019-10-18 NOTE — Telephone Encounter (Signed)
Requested Prescriptions   Signed Prescriptions Disp Refills  . carvedilol (COREG) 12.5 MG tablet 180 tablet 0    Sig: Take 1 tablet (12.5 mg total) by mouth 2 (two) times daily.    Authorizing Provider: KLEIN, STEVEN C    Ordering User: Peng Thorstenson C  . spironolactone (ALDACTONE) 25 MG tablet 45 tablet 0    Sig: Take 0.5 tablets (12.5 mg total) by mouth daily.    Authorizing Provider: KLEIN, STEVEN C    Ordering User: Qunicy Higinbotham C    

## 2019-11-13 ENCOUNTER — Ambulatory Visit (INDEPENDENT_AMBULATORY_CARE_PROVIDER_SITE_OTHER): Payer: Medicare HMO

## 2019-11-13 DIAGNOSIS — Z95810 Presence of automatic (implantable) cardiac defibrillator: Secondary | ICD-10-CM

## 2019-11-13 DIAGNOSIS — I5022 Chronic systolic (congestive) heart failure: Secondary | ICD-10-CM

## 2019-11-14 ENCOUNTER — Telehealth: Payer: Self-pay

## 2019-11-14 NOTE — Progress Notes (Signed)
EPIC Encounter for ICM Monitoring  Patient Name: Adam Douglas is a 69 y.o. male Date: 11/14/2019 Primary Care Physican: Etheleen Nicks, NP Primary Cardiologist:Gollan Electrophysiologist:Klein Bi-V Pacing:97.1% 10/13/2019 Office Weight:236 lbs   Attempted call to patient and unable to reach.   Transmission reviewed.   Optivol thoracic impedancetrending slightly below baseline normal.  Prescribed: Furosemide 40 mgTake 1 tablet daily  Labs: 04/05/2019 Creatinine 1.02, BUN 21, Potassium 3.7, Sodium 141, GFR 75-86  Recommendations: Unable to reach.    Follow-up plan: ICM clinic phone appointment on12/13/2021. 91 day device clinic remote transmission12/10/2019.   Copy of ICM check sent to Dr.Klein.  3 month ICM trend: 11/14/2019    1 Year ICM trend:      Karie Soda, RN 11/14/2019 12:40 PM

## 2019-11-14 NOTE — Telephone Encounter (Signed)
Remote ICM transmission received.  Attempted call to wife regarding ICM remote transmission and no answer. 

## 2019-11-20 ENCOUNTER — Ambulatory Visit: Payer: Medicare HMO | Admitting: Cardiovascular Disease

## 2019-11-20 ENCOUNTER — Ambulatory Visit (INDEPENDENT_AMBULATORY_CARE_PROVIDER_SITE_OTHER): Payer: Medicare HMO

## 2019-11-20 ENCOUNTER — Other Ambulatory Visit: Payer: Self-pay

## 2019-11-20 ENCOUNTER — Encounter: Payer: Self-pay | Admitting: Cardiovascular Disease

## 2019-11-20 VITALS — BP 144/68 | HR 72 | Ht 70.5 in | Wt 241.0 lb

## 2019-11-20 DIAGNOSIS — I480 Paroxysmal atrial fibrillation: Secondary | ICD-10-CM

## 2019-11-20 DIAGNOSIS — I5022 Chronic systolic (congestive) heart failure: Secondary | ICD-10-CM | POA: Diagnosis not present

## 2019-11-20 DIAGNOSIS — I255 Ischemic cardiomyopathy: Secondary | ICD-10-CM

## 2019-11-20 DIAGNOSIS — Z5181 Encounter for therapeutic drug level monitoring: Secondary | ICD-10-CM

## 2019-11-20 LAB — POCT INR: INR: 3.7 — AB (ref 2.0–3.0)

## 2019-11-20 MED ORDER — FUROSEMIDE 40 MG PO TABS: ORAL_TABLET | ORAL | 3 refills | Status: DC

## 2019-11-20 NOTE — Patient Instructions (Addendum)
If breathing does not start to improve with walking, Call the office  Medication Instructions:  Change lasix to 40 daily with extra 40 mg as needed for cough/SOB, or swelling  If you need a refill on your cardiac medications before your next appointment, please call your pharmacy.    Lab work: No new labs needed   If you have labs (blood work) drawn today and your tests are completely normal, you will receive your results only by: Marland Kitchen MyChart Message (if you have MyChart) OR . A paper copy in the mail If you have any lab test that is abnormal or we need to change your treatment, we will call you to review the results.   Testing/Procedures: No new testing needed   Follow-Up: At [facility], you and your health needs are our priority.  As part of our continuing mission to provide you with exceptional heart care, we have created designated Provider Care Teams.  These Care Teams include your primary Cardiologist (physician) and Advanced Practice Providers (APPs -  Physician Assistants and Nurse Practitioners) who all work together to provide you with the care you need, when you need it.  . You will need a follow up appointment in 12 months  . Providers on your designated Care Team:   . Nicolasa Ducking, NP . Eula Listen, PA-C . Marisue Ivan, PA-C  Any Other Special Instructions Will Be Listed Below (If Applicable).  COVID-19 Vaccine Information can be found at: [email or web address] For questions related to vaccine distribution or appointments, please email [email or web address] or call [phone].
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
## 2019-11-20 NOTE — Patient Instructions (Addendum)
-   skip warfarin tonight - take 1/2 tablet tomorrow, then  -START NEW warfarin dosage of 5 mg every day except 1/2 TABLET on MONDAYS, WEDNESDAYS & FRIDAYS. - recheck in 4 weeks.

## 2019-12-15 ENCOUNTER — Ambulatory Visit (INDEPENDENT_AMBULATORY_CARE_PROVIDER_SITE_OTHER): Payer: Medicare HMO

## 2019-12-15 DIAGNOSIS — I255 Ischemic cardiomyopathy: Secondary | ICD-10-CM

## 2019-12-18 ENCOUNTER — Ambulatory Visit: Payer: Self-pay

## 2019-12-18 ENCOUNTER — Ambulatory Visit (INDEPENDENT_AMBULATORY_CARE_PROVIDER_SITE_OTHER): Payer: Medicare HMO

## 2019-12-18 DIAGNOSIS — Z95810 Presence of automatic (implantable) cardiac defibrillator: Secondary | ICD-10-CM | POA: Diagnosis not present

## 2019-12-18 DIAGNOSIS — I5022 Chronic systolic (congestive) heart failure: Secondary | ICD-10-CM | POA: Diagnosis not present

## 2019-12-18 LAB — CUP PACEART REMOTE DEVICE CHECK
Battery Remaining Longevity: 96 mo
Battery Voltage: 3 V
Brady Statistic AP VP Percent: 16.59 %
Brady Statistic AP VS Percent: 0.25 %
Brady Statistic AS VP Percent: 81.31 %
Brady Statistic AS VS Percent: 1.85 %
Brady Statistic RA Percent Paced: 16.8 %
Brady Statistic RV Percent Paced: 35.9 %
Date Time Interrogation Session: 20211210043827
HighPow Impedance: 65 Ohm
Implantable Lead Implant Date: 20200313
Implantable Lead Implant Date: 20200313
Implantable Lead Implant Date: 20200313
Implantable Lead Location: 753858
Implantable Lead Location: 753859
Implantable Lead Location: 753860
Implantable Lead Model: 4396
Implantable Lead Model: 5076
Implantable Lead Model: 6935
Implantable Pulse Generator Implant Date: 20200313
Lead Channel Impedance Value: 247 Ohm
Lead Channel Impedance Value: 342 Ohm
Lead Channel Impedance Value: 418 Ohm
Lead Channel Impedance Value: 418 Ohm
Lead Channel Impedance Value: 456 Ohm
Lead Channel Impedance Value: 722 Ohm
Lead Channel Pacing Threshold Amplitude: 0.625 V
Lead Channel Pacing Threshold Amplitude: 0.875 V
Lead Channel Pacing Threshold Amplitude: 1.25 V
Lead Channel Pacing Threshold Pulse Width: 0.4 ms
Lead Channel Pacing Threshold Pulse Width: 0.4 ms
Lead Channel Pacing Threshold Pulse Width: 0.4 ms
Lead Channel Sensing Intrinsic Amplitude: 1.5 mV
Lead Channel Sensing Intrinsic Amplitude: 1.5 mV
Lead Channel Sensing Intrinsic Amplitude: 8.75 mV
Lead Channel Sensing Intrinsic Amplitude: 8.75 mV
Lead Channel Setting Pacing Amplitude: 1.5 V
Lead Channel Setting Pacing Amplitude: 1.75 V
Lead Channel Setting Pacing Amplitude: 2 V
Lead Channel Setting Pacing Pulse Width: 0.4 ms
Lead Channel Setting Pacing Pulse Width: 0.4 ms
Lead Channel Setting Sensing Sensitivity: 0.3 mV

## 2019-12-18 NOTE — Progress Notes (Signed)
Left message for pt that he missed INR check appt today. Asked him to call back to reschedule.

## 2019-12-20 NOTE — Progress Notes (Signed)
EPIC Encounter for ICM Monitoring  Patient Name: Adam Douglas is a 69 y.o. male Date: 12/20/2019 Primary Care Physican: Etheleen Nicks, NP Primary Cardiologist:Gollan Electrophysiologist:Klein Bi-V Pacing:97.4% 11/20/2019 Office Weight:241 lbs   Transmission reviewed.   Optivol thoracic impedancenormal.  Prescribed: Furosemide 40 mgTake 1 tablet daily  Labs: 04/05/2019 Creatinine 1.02, BUN 21, Potassium 3.7, Sodium 141, GFR 75-86  Recommendations:No changes  Follow-up plan: ICM clinic phone appointment on2/07/2020. 91 day device clinic remote transmission3/11/2020.   Copy of ICM check sent to Dr.Klein  3 month ICM trend: 12/18/2019    1 Year ICM trend:       Karie Soda, RN 12/20/2019 10:55 AM

## 2019-12-25 ENCOUNTER — Other Ambulatory Visit: Payer: Self-pay

## 2019-12-25 ENCOUNTER — Ambulatory Visit (INDEPENDENT_AMBULATORY_CARE_PROVIDER_SITE_OTHER): Payer: Medicare HMO

## 2019-12-25 ENCOUNTER — Telehealth: Payer: Self-pay

## 2019-12-25 DIAGNOSIS — Z5181 Encounter for therapeutic drug level monitoring: Secondary | ICD-10-CM | POA: Diagnosis not present

## 2019-12-25 DIAGNOSIS — I480 Paroxysmal atrial fibrillation: Secondary | ICD-10-CM

## 2019-12-25 LAB — POCT INR: INR: 2.1 (ref 2.0–3.0)

## 2019-12-25 NOTE — Telephone Encounter (Signed)
Pt dropped off Entresto patient assistance form, this RN filled out provider's part of form, waiting on Dr. Mariah Milling to sign, placed in signature folder, will fax when complete.

## 2019-12-25 NOTE — Patient Instructions (Signed)
-   continue dosage of warfarin dosage of 5 mg every day except 1/2 TABLET on MONDAYS, WEDNESDAYS & FRIDAYS. - recheck in 5 weeks.

## 2019-12-28 NOTE — Progress Notes (Signed)
Remote ICD transmission.   

## 2020-01-04 NOTE — Progress Notes (Signed)
Entresto pt assistance form signed by Dr. Gollan, was faxed to [employer] 

## 2020-01-15 NOTE — Telephone Encounter (Signed)
Patient wife calling to check status of forms.

## 2020-01-15 NOTE — Telephone Encounter (Signed)
Unable to leave message, mailbox full Completed Sherryll Burger form fax on 12/30, have not heard back for approval or non approval

## 2020-01-19 ENCOUNTER — Other Ambulatory Visit: Payer: Self-pay | Admitting: Internal Medicine

## 2020-01-31 ENCOUNTER — Ambulatory Visit (INDEPENDENT_AMBULATORY_CARE_PROVIDER_SITE_OTHER): Payer: Medicare HMO

## 2020-01-31 ENCOUNTER — Other Ambulatory Visit: Payer: Self-pay

## 2020-01-31 DIAGNOSIS — Z5181 Encounter for therapeutic drug level monitoring: Secondary | ICD-10-CM

## 2020-01-31 DIAGNOSIS — I480 Paroxysmal atrial fibrillation: Secondary | ICD-10-CM

## 2020-01-31 LAB — POCT INR: INR: 2.3 (ref 2.0–3.0)

## 2020-01-31 NOTE — Patient Instructions (Signed)
-   continue dosage of warfarin dosage of 1 tablet (5 mg) every day except 1/2 TABLET (2.5 mg) on MONDAYS, WEDNESDAYS & FRIDAYS. - recheck in 6 weeks.

## 2020-02-02 ENCOUNTER — Telehealth: Payer: Self-pay | Admitting: Cardiovascular Disease

## 2020-02-02 MED ORDER — ENTRESTO 49-51 MG PO TABS
1.0000 | ORAL_TABLET | Freq: Two times a day (BID) | ORAL | 3 refills | Status: DC
Start: 2020-02-02 — End: 2021-04-02

## 2020-02-02 NOTE — Telephone Encounter (Signed)
Received fax from [facility] by [facility] requesting refills for Entresto 49-51 mg. Rx request sent to pharmacy.

## 2020-02-12 ENCOUNTER — Ambulatory Visit (INDEPENDENT_AMBULATORY_CARE_PROVIDER_SITE_OTHER): Payer: Medicare HMO

## 2020-02-12 DIAGNOSIS — I5022 Chronic systolic (congestive) heart failure: Secondary | ICD-10-CM | POA: Diagnosis not present

## 2020-02-12 DIAGNOSIS — Z95810 Presence of automatic (implantable) cardiac defibrillator: Secondary | ICD-10-CM

## 2020-02-12 NOTE — Telephone Encounter (Signed)
Per fax received from [employer] [facility], patient has been approved to receive Entresto at no cost until the end of the calendar year at which time the company will re-evaluate patient's eligibility.

## 2020-02-16 NOTE — Progress Notes (Signed)
EPIC Encounter for ICM Monitoring  Patient Name: Adam Douglas is a 70 y.o. male Date: 02/16/2020 Primary Care Physican: Etheleen Nicks, NP Primary Cardiologist:Gollan Electrophysiologist:Klein Bi-V Pacing:97.9% 1/14/2022OfficeWeight:238 lbs   Transmission reviewed.  Optivol thoracic impedancenormal.  Prescribed: Furosemide 40 mgTake 1 tablet daily  Labs: 04/05/2019 Creatinine 1.02, BUN 21, Potassium 3.7, Sodium 141, GFR 75-86  Recommendations:No changes  Follow-up plan: ICM clinic phone appointment on3/14/2022. 91 day device clinic remote transmission3/11/2020.   Copy of ICM check sent to Dr.Klein  3 month ICM trend: 02/12/2020.    1 Year ICM trend:       Karie Soda, RN 02/16/2020 1:29 PM

## 2020-02-20 ENCOUNTER — Other Ambulatory Visit: Payer: Self-pay | Admitting: Cardiovascular Disease

## 2020-03-13 ENCOUNTER — Ambulatory Visit (INDEPENDENT_AMBULATORY_CARE_PROVIDER_SITE_OTHER): Payer: Medicare HMO

## 2020-03-13 ENCOUNTER — Other Ambulatory Visit: Payer: Self-pay

## 2020-03-13 DIAGNOSIS — I480 Paroxysmal atrial fibrillation: Secondary | ICD-10-CM

## 2020-03-13 DIAGNOSIS — Z5181 Encounter for therapeutic drug level monitoring: Secondary | ICD-10-CM | POA: Diagnosis not present

## 2020-03-13 LAB — POCT INR: INR: 2.3 (ref 2.0–3.0)

## 2020-03-13 NOTE — Patient Instructions (Signed)
-   continue dosage of warfarin dosage of 1 tablet (5 mg) every day except 1/2 TABLET (2.5 mg) on MONDAYS, WEDNESDAYS & FRIDAYS. - recheck in 6 weeks.

## 2020-03-15 ENCOUNTER — Ambulatory Visit (INDEPENDENT_AMBULATORY_CARE_PROVIDER_SITE_OTHER): Payer: Medicare HMO

## 2020-03-15 DIAGNOSIS — I255 Ischemic cardiomyopathy: Secondary | ICD-10-CM

## 2020-03-18 ENCOUNTER — Ambulatory Visit (INDEPENDENT_AMBULATORY_CARE_PROVIDER_SITE_OTHER): Payer: Medicare HMO

## 2020-03-18 DIAGNOSIS — I5022 Chronic systolic (congestive) heart failure: Secondary | ICD-10-CM | POA: Diagnosis not present

## 2020-03-18 DIAGNOSIS — Z95810 Presence of automatic (implantable) cardiac defibrillator: Secondary | ICD-10-CM

## 2020-03-18 LAB — CUP PACEART REMOTE DEVICE CHECK
Battery Remaining Longevity: 94 mo
Battery Voltage: 3 V
Brady Statistic AP VP Percent: 18.87 %
Brady Statistic AP VS Percent: 0.28 %
Brady Statistic AS VP Percent: 79.6 %
Brady Statistic AS VS Percent: 1.25 %
Brady Statistic RA Percent Paced: 19.13 %
Brady Statistic RV Percent Paced: 31.86 %
Date Time Interrogation Session: 20220311033623
HighPow Impedance: 65 Ohm
Implantable Lead Implant Date: 20200313
Implantable Lead Implant Date: 20200313
Implantable Lead Implant Date: 20200313
Implantable Lead Location: 753858
Implantable Lead Location: 753859
Implantable Lead Location: 753860
Implantable Lead Model: 4396
Implantable Lead Model: 5076
Implantable Lead Model: 6935
Implantable Pulse Generator Implant Date: 20200313
Lead Channel Impedance Value: 285 Ohm
Lead Channel Impedance Value: 342 Ohm
Lead Channel Impedance Value: 418 Ohm
Lead Channel Impedance Value: 456 Ohm
Lead Channel Impedance Value: 456 Ohm
Lead Channel Impedance Value: 760 Ohm
Lead Channel Pacing Threshold Amplitude: 0.75 V
Lead Channel Pacing Threshold Amplitude: 0.875 V
Lead Channel Pacing Threshold Amplitude: 1.125 V
Lead Channel Pacing Threshold Pulse Width: 0.4 ms
Lead Channel Pacing Threshold Pulse Width: 0.4 ms
Lead Channel Pacing Threshold Pulse Width: 0.4 ms
Lead Channel Sensing Intrinsic Amplitude: 1.75 mV
Lead Channel Sensing Intrinsic Amplitude: 1.75 mV
Lead Channel Sensing Intrinsic Amplitude: 9.25 mV
Lead Channel Sensing Intrinsic Amplitude: 9.25 mV
Lead Channel Setting Pacing Amplitude: 1.5 V
Lead Channel Setting Pacing Amplitude: 1.75 V
Lead Channel Setting Pacing Amplitude: 2 V
Lead Channel Setting Pacing Pulse Width: 0.4 ms
Lead Channel Setting Pacing Pulse Width: 0.4 ms
Lead Channel Setting Sensing Sensitivity: 0.3 mV

## 2020-03-20 NOTE — Progress Notes (Signed)
EPIC Encounter for ICM Monitoring  Patient Name: Adam Douglas is a 70 y.o. male Date: 03/20/2020 Primary Care Physican: Etheleen Nicks, NP Primary Cardiologist:Gollan Electrophysiologist:Klein Bi-V Pacing:98.2% 1/14/2022OfficeWeight:238lbs   Attempted call to wife and unable to reach.   Transmission reviewed.   Optivol thoracic impedancenormal.  Prescribed: Furosemide 40 mgTake 1 tablet daily  Labs: 04/05/2019 Creatinine 1.02, BUN 21, Potassium 3.7, Sodium 141, GFR 75-86  Recommendations:  Unable to reach.    Follow-up plan: ICM clinic phone appointment on4/18/2022. 91 day device clinic remote transmission6/10/2020.   EP/Cardiology Next Office Visit: Recall 03/09/2020 with Dr Graciela Husbands.  Recall 11/19/2020 with Dr Mariah Milling  Copy of ICM check sent to Dr.Klein  3 month ICM trend: 03/18/2020.    1 Year ICM trend:       Karie Soda, RN 03/20/2020 12:01 PM

## 2020-03-22 NOTE — Progress Notes (Signed)
Remote ICD transmission.   

## 2020-04-22 ENCOUNTER — Ambulatory Visit (INDEPENDENT_AMBULATORY_CARE_PROVIDER_SITE_OTHER): Payer: Medicare HMO

## 2020-04-22 DIAGNOSIS — Z95810 Presence of automatic (implantable) cardiac defibrillator: Secondary | ICD-10-CM | POA: Diagnosis not present

## 2020-04-22 DIAGNOSIS — I5022 Chronic systolic (congestive) heart failure: Secondary | ICD-10-CM

## 2020-04-24 ENCOUNTER — Ambulatory Visit (INDEPENDENT_AMBULATORY_CARE_PROVIDER_SITE_OTHER): Payer: Medicare HMO

## 2020-04-24 ENCOUNTER — Other Ambulatory Visit: Payer: Self-pay

## 2020-04-24 DIAGNOSIS — Z5181 Encounter for therapeutic drug level monitoring: Secondary | ICD-10-CM

## 2020-04-24 DIAGNOSIS — I480 Paroxysmal atrial fibrillation: Secondary | ICD-10-CM

## 2020-04-24 LAB — POCT INR: INR: 2.1 (ref 2.0–3.0)

## 2020-04-24 NOTE — Patient Instructions (Signed)
-   continue dosage of warfarin dosage of 1 tablet (5 mg) every day except 1/2 TABLET (2.5 mg) on MONDAYS, WEDNESDAYS & FRIDAYS. - recheck in 6 weeks.

## 2020-04-26 NOTE — Progress Notes (Signed)
EPIC Encounter for ICM Monitoring  Patient Name: Adam Douglas is a 70 y.o. male Date: 04/26/2020 Primary Care Physican: Etheleen Nicks, NP Primary Cardiologist:Gollan Electrophysiologist:Klein Bi-V Pacing:98.3% 1/14/2022OfficeWeight:238lbs   Transmission reviewed.   Optivol thoracic impedancenormal.  Prescribed: Furosemide 40 mgTake 1 tablet daily  Labs: 04/05/2019 Creatinine 1.02, BUN 21, Potassium 3.7, Sodium 141, GFR 75-86  Recommendations: No changes    Follow-up plan: ICM clinic phone appointment on6/14/2022. 91 day device clinic remote transmission6/10/2020.   EP/Cardiology Next Office Visit: Recall 03/09/2020 with Dr Graciela Husbands.  Recall 11/19/2020 with Dr Mariah Milling  Copy of ICM check sent to Dr.Klein  3 month ICM trend: 04/22/2020.    1 Year ICM trend:       Karie Soda, RN 04/26/2020 4:56 PM

## 2020-06-05 ENCOUNTER — Ambulatory Visit (INDEPENDENT_AMBULATORY_CARE_PROVIDER_SITE_OTHER): Payer: Medicare HMO

## 2020-06-05 ENCOUNTER — Other Ambulatory Visit: Payer: Self-pay

## 2020-06-05 DIAGNOSIS — I480 Paroxysmal atrial fibrillation: Secondary | ICD-10-CM | POA: Diagnosis not present

## 2020-06-05 DIAGNOSIS — Z5181 Encounter for therapeutic drug level monitoring: Secondary | ICD-10-CM | POA: Diagnosis not present

## 2020-06-05 LAB — POCT INR: INR: 1.4 — AB (ref 2.0–3.0)

## 2020-06-05 NOTE — Patient Instructions (Signed)
-   take extra 1/2 tablet today and tomorrow, then  - on Friday, resume dosage of warfarin dosage of 1 tablet (5 mg) every day except 1/2 TABLET (2.5 mg) on MONDAYS, WEDNESDAYS & FRIDAYS. - recheck in 4 weeks.

## 2020-06-14 ENCOUNTER — Ambulatory Visit (INDEPENDENT_AMBULATORY_CARE_PROVIDER_SITE_OTHER): Payer: Medicare HMO

## 2020-06-14 DIAGNOSIS — I480 Paroxysmal atrial fibrillation: Secondary | ICD-10-CM | POA: Diagnosis not present

## 2020-06-14 LAB — CUP PACEART REMOTE DEVICE CHECK
Battery Remaining Longevity: 89 mo
Battery Voltage: 3 V
Brady Statistic AP VP Percent: 18.45 %
Brady Statistic AP VS Percent: 0.27 %
Brady Statistic AS VP Percent: 80.03 %
Brady Statistic AS VS Percent: 1.25 %
Brady Statistic RA Percent Paced: 18.71 %
Brady Statistic RV Percent Paced: 34.09 %
Date Time Interrogation Session: 20220610033423
HighPow Impedance: 64 Ohm
Implantable Lead Implant Date: 20200313
Implantable Lead Implant Date: 20200313
Implantable Lead Implant Date: 20200313
Implantable Lead Location: 753858
Implantable Lead Location: 753859
Implantable Lead Location: 753860
Implantable Lead Model: 4396
Implantable Lead Model: 5076
Implantable Lead Model: 6935
Implantable Pulse Generator Implant Date: 20200313
Lead Channel Impedance Value: 285 Ohm
Lead Channel Impedance Value: 361 Ohm
Lead Channel Impedance Value: 399 Ohm
Lead Channel Impedance Value: 418 Ohm
Lead Channel Impedance Value: 513 Ohm
Lead Channel Impedance Value: 817 Ohm
Lead Channel Pacing Threshold Amplitude: 0.625 V
Lead Channel Pacing Threshold Amplitude: 0.875 V
Lead Channel Pacing Threshold Amplitude: 1.25 V
Lead Channel Pacing Threshold Pulse Width: 0.4 ms
Lead Channel Pacing Threshold Pulse Width: 0.4 ms
Lead Channel Pacing Threshold Pulse Width: 0.4 ms
Lead Channel Sensing Intrinsic Amplitude: 2 mV
Lead Channel Sensing Intrinsic Amplitude: 2 mV
Lead Channel Sensing Intrinsic Amplitude: 9.125 mV
Lead Channel Sensing Intrinsic Amplitude: 9.125 mV
Lead Channel Setting Pacing Amplitude: 1.5 V
Lead Channel Setting Pacing Amplitude: 2 V
Lead Channel Setting Pacing Amplitude: 2.25 V
Lead Channel Setting Pacing Pulse Width: 0.4 ms
Lead Channel Setting Pacing Pulse Width: 0.4 ms
Lead Channel Setting Sensing Sensitivity: 0.3 mV

## 2020-06-17 ENCOUNTER — Telehealth: Payer: Self-pay | Admitting: Cardiovascular Disease

## 2020-06-17 MED ORDER — WARFARIN SODIUM 5 MG PO TABS: ORAL_TABLET | ORAL | 0 refills | Status: DC

## 2020-06-17 NOTE — Telephone Encounter (Signed)
*  STAT* If patient is at the pharmacy, call can be transferred to refill team.   1. Which medications need to be refilled? (please list name of each medication and dose if known)  Warfarin  2. Which pharmacy/location (including street and city if local pharmacy) is medication to be sent to?[facility] [place]  3. Do they need a 30 day or 90 day supply? 90

## 2020-06-17 NOTE — Telephone Encounter (Signed)
Refill sent in as requested. 

## 2020-06-18 ENCOUNTER — Ambulatory Visit (INDEPENDENT_AMBULATORY_CARE_PROVIDER_SITE_OTHER): Payer: Medicare HMO

## 2020-06-18 DIAGNOSIS — Z95810 Presence of automatic (implantable) cardiac defibrillator: Secondary | ICD-10-CM | POA: Diagnosis not present

## 2020-06-18 DIAGNOSIS — I5022 Chronic systolic (congestive) heart failure: Secondary | ICD-10-CM

## 2020-06-21 NOTE — Progress Notes (Signed)
EPIC Encounter for ICM Monitoring  Patient Name: Adam Douglas is a 70 y.o. male Date: 06/21/2020 Primary Care Physican: Etheleen Nicks, NP Primary Cardiologist: Mariah Milling Electrophysiologist: Joycelyn Schmid Pacing: 97.9%         06/17/2020 Office Weight: 243 lbs                                                            Transmission reviewed.    Optivol thoracic impedance normal.   Prescribed:   Furosemide 40 mg Take one tablet (40mg ) by mouth once daily, you may take one additional tablet (40mg ) once daily as needed for swelling or increase shortness of breath. Spironolactone 25 mg take 0.5 tablet (12.5 mg total) daily  Labs: 04/26/2020 Creatinine 1.14, BUN 23, Potassium 3.9, Sodium 138, GFR 65-75   Recommendations:  No changes     Follow-up plan: ICM clinic phone appointment on 07/29/2020.    91 day device clinic remote transmission 09/13/2020.      EP/Cardiology Next Office Visit: Recall 03/09/2020 with Dr 11/13/2020.  Recall 11/19/2020 with Dr Graciela Husbands   Copy of ICM check sent to Dr. 11/21/2020  3 month ICM trend: 06/18/2020.    1 Year ICM trend:       Graciela Husbands, RN 06/21/2020 11:04 AM

## 2020-07-02 NOTE — Progress Notes (Signed)
Remote ICD transmission.   

## 2020-07-04 IMAGING — DX CHEST - 2 VIEW
2 series · 2 of 2 positions shown · non-contrast
Comparison: 02/24/2017

CLINICAL DATA: Post pacemaker, arm soreness

EXAM:
CHEST - 2 VIEW

[w chest pa]
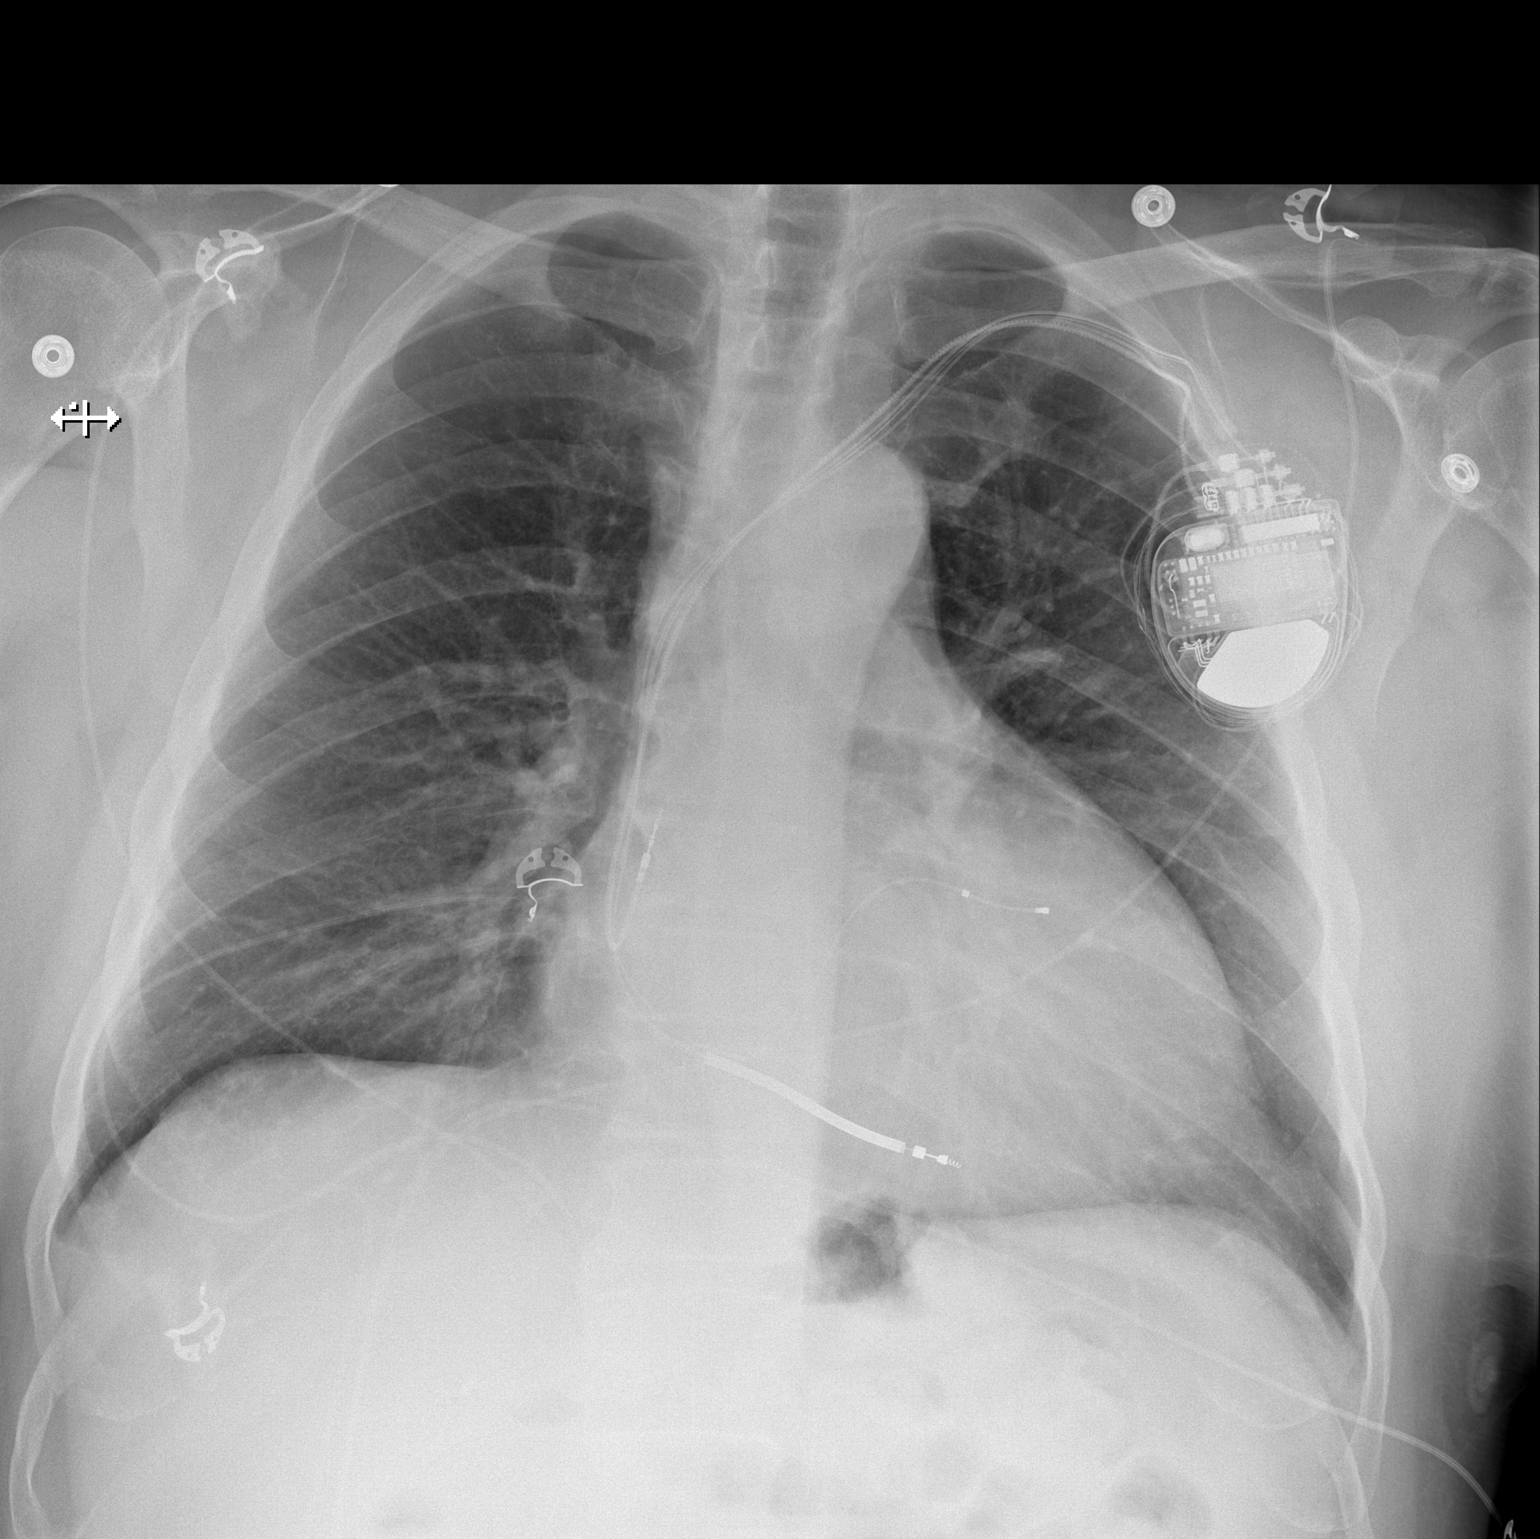

[w chest lat]
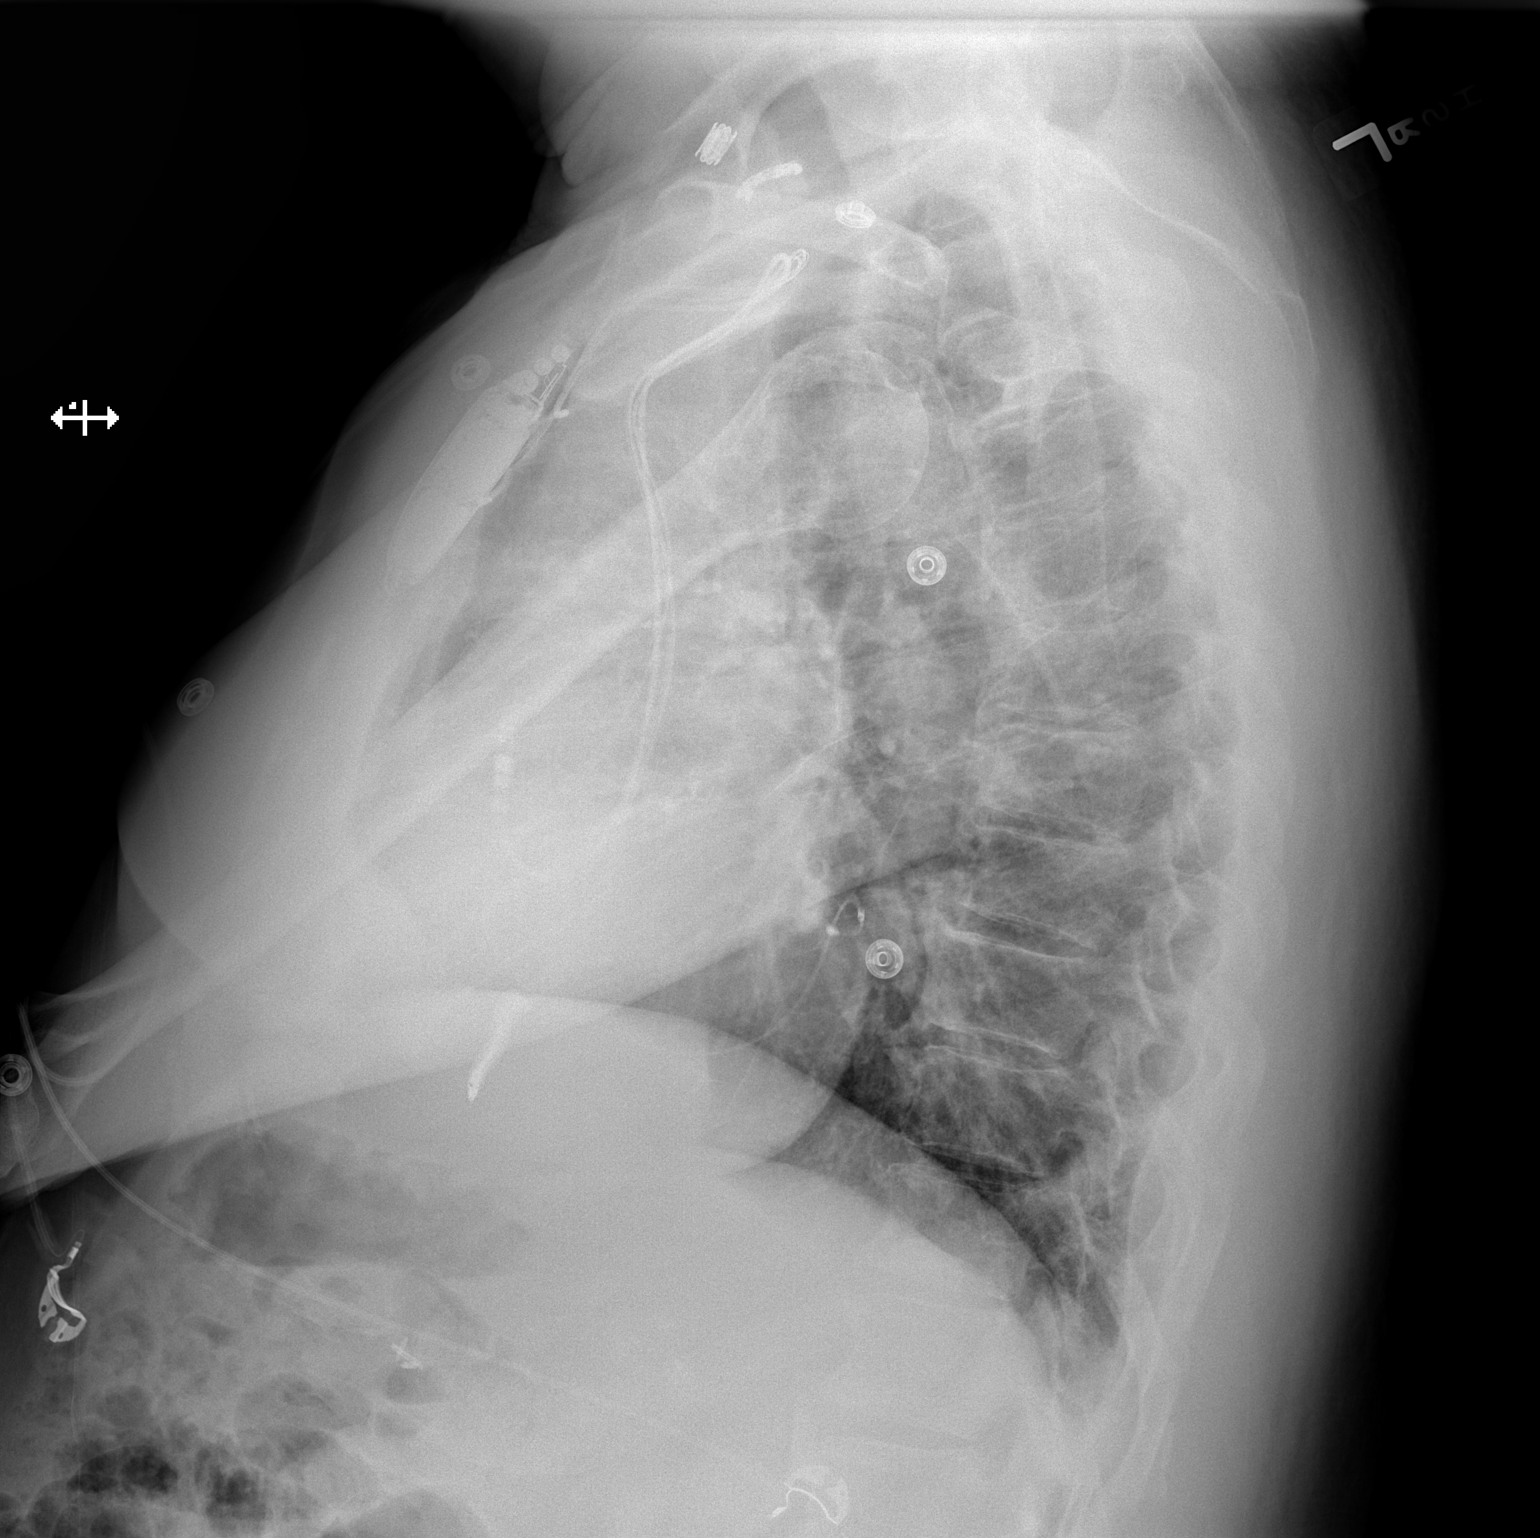

[2 of 2 positions shown; findings below may reference images not displayed]

FINDINGS: Lungs are clear.  No pleural effusion or pneumothorax.

The heart is normal in size.

Left subclavian ICD in satisfactory position.
IMPRESSION: Left subclavian ICD in satisfactory position.

## 2020-07-10 ENCOUNTER — Other Ambulatory Visit: Payer: Self-pay

## 2020-07-10 ENCOUNTER — Ambulatory Visit (INDEPENDENT_AMBULATORY_CARE_PROVIDER_SITE_OTHER): Payer: Medicare HMO | Admitting: Pharmacist

## 2020-07-10 DIAGNOSIS — I480 Paroxysmal atrial fibrillation: Secondary | ICD-10-CM

## 2020-07-10 DIAGNOSIS — Z5181 Encounter for therapeutic drug level monitoring: Secondary | ICD-10-CM | POA: Diagnosis not present

## 2020-07-10 LAB — POCT INR: INR: 2.4 (ref 2.0–3.0)

## 2020-07-10 NOTE — Patient Instructions (Signed)
Description   - on Friday, resume dosage of warfarin dosage of 1 tablet (5 mg) every day except 1/2 TABLET (2.5 mg) on MONDAYS, WEDNESDAYS & FRIDAYS. - recheck in 4 weeks.

## 2020-07-19 ENCOUNTER — Telehealth: Payer: Self-pay | Admitting: Cardiovascular Disease

## 2020-07-19 NOTE — Telephone Encounter (Signed)
Pt c/o Shortness Of Breath: STAT if SOB developed within the last 24 hours or pt is noticeably SOB on the phone  1. Are you currently SOB (can you hear that pt is SOB on the phone)? yes  2. How long have you been experiencing SOB? A few days  3. Are you SOB when sitting or when up moving around? Whenever he moves and repositions   4. Are you currently experiencing any other symptoms? Having trouble walking around house, coughing and sounds like there is fluid. Patients wife states they have doubled up on his fluid pill which does not seem to help.    Patient scheduled 7/26 but wants sooner

## 2020-07-19 NOTE — Telephone Encounter (Signed)
Was able to return call to Adam Douglas concerning her husband (DPR approved). She reports pt has had increase shob over the past two days, worse with up moving around and when he is trying to reposition. Also reports he has had a cough, sounds like their is fluid build up in his lungs that he cannot get out, rattling sounds. Wife reports has given him 2 extra lasix pill this week.  She reports Adam Douglas has not had swelling to feet, ankle, abd, or other parts to body, just the increase shob and cough. Has other appts next week, advised if PCP can run a CXR to may sure there is nothing going on with the lungs that required interventions or ABX since appears to have fluid build up and cough.  Advised on his Lasix lasix 40 daily with extra 40 mg as needed for cough/SOB, or swelling  Cathy verbalized understanding, will give extra 40 mg in the day if shob is not getting better or if swelling develops. Appt made for 7/26 at 8 am with Ward Givens, NP and place on a waiting list as well as requested per wife.  Otherwise all questions or concerns were address and no additional concerns at this time. Agreeable to plan, will call back for anything further.

## 2020-07-29 ENCOUNTER — Ambulatory Visit (INDEPENDENT_AMBULATORY_CARE_PROVIDER_SITE_OTHER): Payer: Medicare HMO

## 2020-07-29 DIAGNOSIS — I5022 Chronic systolic (congestive) heart failure: Secondary | ICD-10-CM | POA: Diagnosis not present

## 2020-07-29 DIAGNOSIS — Z95810 Presence of automatic (implantable) cardiac defibrillator: Secondary | ICD-10-CM | POA: Diagnosis not present

## 2020-07-30 ENCOUNTER — Other Ambulatory Visit: Payer: Self-pay

## 2020-07-30 ENCOUNTER — Encounter: Payer: Self-pay | Admitting: Nurse Practitioner

## 2020-07-30 ENCOUNTER — Ambulatory Visit: Payer: Medicare HMO | Admitting: Nurse Practitioner

## 2020-07-30 VITALS — BP 132/72 | HR 68 | Ht 70.5 in | Wt 243.0 lb

## 2020-07-30 DIAGNOSIS — E119 Type 2 diabetes mellitus without complications: Secondary | ICD-10-CM

## 2020-07-30 DIAGNOSIS — I255 Ischemic cardiomyopathy: Secondary | ICD-10-CM | POA: Diagnosis not present

## 2020-07-30 DIAGNOSIS — I2510 Atherosclerotic heart disease of native coronary artery without angina pectoris: Secondary | ICD-10-CM | POA: Diagnosis not present

## 2020-07-30 DIAGNOSIS — Z794 Long term (current) use of insulin: Secondary | ICD-10-CM

## 2020-07-30 DIAGNOSIS — I5020 Unspecified systolic (congestive) heart failure: Secondary | ICD-10-CM | POA: Diagnosis not present

## 2020-07-30 DIAGNOSIS — I10 Essential (primary) hypertension: Secondary | ICD-10-CM

## 2020-07-30 DIAGNOSIS — I480 Paroxysmal atrial fibrillation: Secondary | ICD-10-CM

## 2020-07-30 DIAGNOSIS — E785 Hyperlipidemia, unspecified: Secondary | ICD-10-CM

## 2020-07-30 NOTE — Progress Notes (Signed)
Office Visit    Patient Name: Adam Douglas Date of Encounter: 07/30/2020  Primary Care Provider:  Etheleen Nicks, NP Primary Cardiologist:  Julien Nordmann, MD  Chief Complaint    70 year old male with a history of coronary artery disease, chronic HFrEF, ischemic cardiomyopathy status post biventricular AICD, diabetes, hypertension, hyperlipidemia, ocular strokes, obesity, and paroxysmal atrial fibrillation, who presents for follow-up of heart failure and CAD.  Past Medical History    Past Medical History:  Diagnosis Date   CAD (coronary artery disease)    a. 02/2017 NSTEMI/Cath: LM 40d, LAD 52m, 53m/d, D2 small w/ sev prox/m dzs, LCX nl, OM1 sev diff dzs - small vessel, OM2 80-90p/m, RCA dominant 33m, RPDA 50, EF <20%-->Med Rx.   Diabetes mellitus without complication (HCC)    HFrEF (heart failure with reduced ejection fraction) (HCC)    a. 02/2017 Echo: EF 20-25%; b. 03/2018 Echo: EF 30-35%, mild conc LVH. Impaired relaxation. Nl RV fxn. Mild BAE. Mild to mod MR. Mild Ao root dil.   Hyperlipidemia    Hypertension    Ischemic cardiomyopathy    a. 02/2017 Echo: EF 20-25%; b. 03/2018 Echo: EF 30-35%; c. 03/2018 s/p MDT [id number] [facility] MRI CRT-D.   LBBB (left bundle branch block)    a. Noted 02/2017. Pt denies any known prior history of LBBB (not present on 2014 [facility] ECG interpretation).   Morbid obesity (HCC)    Stroke (HCC)    a. Ocular strokes x 3 - prev eval in [facility] for first 2, [facility] for last one.   Past Surgical History:  Procedure Laterality Date   BIV ICD INSERTION CRT-D N/A 03/18/2018   Procedure: BIV ICD INSERTION CRT-D;  Surgeon: Duke Salvia, MD;  Location: [facility] INVASIVE CV LAB;  Service: Cardiovascular;  Laterality: N/A;   CERVICAL DISC SURGERY     CHOLECYSTECTOMY     LEFT HEART CATH AND CORONARY ANGIOGRAPHY N/A 03/03/2017   Procedure: LEFT HEART CATH AND CORONARY ANGIOGRAPHY;  Surgeon: Antonieta Iba, MD;  Location: [facility] INVASIVE CV LAB;  Service:  Cardiovascular;  Laterality: N/A;    Allergies  No Known Allergies  History of Present Illness    70 year old male with the above complex past medical history including CAD, chronic HFrEF, ischemic cardiomyopathy status post biventricular AICD, diabetes, hypertension, hyperlipidemia, ocular strokes, obesity, and paroxysmal atrial fibrillation.   He suffered a non-STEMI in February 2019 with diagnostic catheterization revealing moderate to severe diffuse, small vessel coronary disease with an EF of less than 20% by ventriculography.  Echo showed an EF of 20 to 25%.  Medical therapy was recommended given diffuse nature of disease.  Despite guideline directed medical therapy, patient continued to have reduced LV dysfunction with an EF of 30 to 35% by echocardiogram March 2020.  He subsequently underwent CRT-D in March 2020.  He was subsequently diagnosed with paroxysmal atrial fibrillation and has been on warfarin therapy.  He was last seen in cardiology clinic in November 2021 at which time he reported chronic stable dyspnea on exertion with recommendation for regular activity and possible cardiac rehab referral.  Unfortunately, patient has not changed his activity levels at home.  He remains very sedentary.  He and his wife note that he can walk about 15 to 20 yards before becoming dyspneic and having to rest.  He does not experience chest pain.  His wife recently called to report that patient has been having increasing shortness of breath and they were advised that he may take  an additional Lasix as needed.  She says since that phone call, he has taken 3-4 additional doses of Lasix, usually when she notes that his "eyes are swollen."  He denies palpitations, PND, orthopnea, dizziness, syncope, edema, or early satiety.  Home Medications    Current Outpatient Medications  Medication Sig Dispense Refill   acetaminophen (TYLENOL) 650 MG CR tablet Take 1,300 mg by mouth 2 (two) times daily.     aspirin  EC 81 MG tablet Take 1 tablet (81 mg total) by mouth daily. 90 tablet 3   atorvastatin (LIPITOR) 80 MG tablet Take 80 mg by mouth daily.     carvedilol (COREG) 12.5 MG tablet Take 1 tablet by mouth twice daily 180 tablet 3   citalopram (CELEXA) 40 MG tablet Take 40 mg by mouth daily.     cyclobenzaprine (FLEXERIL) 10 MG tablet Take by mouth.     furosemide (LASIX) 40 MG tablet Take one tablet (40mg ) by mouth once daily, you may take one additional tablet (40mg ) once daily as needed for swelling or increase shortness of breath. 135 tablet 3   insulin aspart (NOVOLOG) 100 UNIT/ML injection Inject 15 Units into the skin 3 (three) times daily with meals.     insulin detemir (LEVEMIR) 100 UNIT/ML injection Inject 20 Units into the skin as needed.     metFORMIN (GLUCOPHAGE-XR) 500 MG 24 hr tablet Take 500 mg by mouth 2 (two) times daily.     nitroGLYCERIN (NITROSTAT) 0.4 MG SL tablet PLACE 1 TABLET UNDER THE TONGUE EVERY 5 MINUTES AS NEEDED 25 tablet 0   omeprazole (PRILOSEC) 20 MG capsule Take 20 mg by mouth 2 (two) times daily before a meal.     sacubitril-valsartan (ENTRESTO) 49-51 MG Take 1 tablet by mouth 2 (two) times daily. 180 tablet 3   spironolactone (ALDACTONE) 25 MG tablet Take 1/2 (one-half) tablet by mouth once daily 45 tablet 3   traMADol (ULTRAM) 50 MG tablet Take 50 mg by mouth every 8 (eight) hours as needed for pain.     warfarin (COUMADIN) 5 MG tablet TAKE 1/2 TO 1 TABLET BY MOUTH DAILY AS DIRECTED BY THE COUMADIN CLINIC. 90 tablet 0   No current facility-administered medications for this visit.     Review of Systems    Chronic dyspnea on exertion when walking somewhere between 15 and 20 yards.  This has not really changed since his last visit in November.  He denies chest pain, palpitations, PND, orthopnea, dizziness, syncope, edema, or early satiety.  All other systems reviewed and are otherwise negative except as noted above.  Physical Exam    VS:  BP 132/72 (BP Location:  Left Arm, Patient Position: Sitting, Cuff Size: Normal)   Pulse 68   Ht 5' 10.5" (1.791 m)   Wt 243 lb (110.2 kg)   SpO2 94%   BMI 34.37 kg/m  , BMI Body mass index is 34.37 kg/m.     GEN: Obese, in no acute distress. HEENT: normal. Neck: Supple, no JVD, carotid bruits, or masses. Cardiac: RRR, distant, no murmurs, rubs, or gallops. No clubbing, cyanosis, edema.  Radials/DP/PT 2+ and equal bilaterally.  Respiratory:  Respirations regular and unlabored, clear to auscultation bilaterally. GI: Obese, soft, nontender, nondistended, BS + x 4. MS: no deformity or atrophy. Skin: warm and dry, no rash. Neuro:  Strength and sensation are intact. Psych: Normal affect.  Accessory Clinical Findings    ECG personally reviewed by me today -AV paced with occasional a sensing V  pacing - no acute changes.  Labs dated July 24, 2020-Care Everywhere  Hemoglobin A1c 8.1  Labs dated April 26, 2020-Care Everywhere  Hemoglobin 12.7, hematocrit 36.1, WBC 9.5, platelets 198 Sodium 138, potassium 3.9, chloride 107, CO2 24, BUN 23, creatinine 1.14, glucose 235 Calcium 8.6, albumin 3.5, total protein 6.7 Total bilirubin 0.3, alkaline phosphatase 107, AST 22, ALT 31 Total cholesterol 88, triglycerides 164, HDL 24, LDL 31   Assessment & Plan    1.  Chronic heart failure with reduced ejection fraction/ischemic cardiomyopathy: Patient with LV dysfunction and EF previously as low as 20 to 25%.  Most recent EF was 30 to 35% by echocardiogram March 2020.  He has known, diffuse, multivessel/small vessel coronary artery disease.  He has chronic dyspnea on exertion in the setting of inactivity and deconditioning.  His wife feels that his degree of dyspnea exertion has worsened recently though on further discussion, they think that perhaps he has been stable since at least last November.  His weight has been stable.  He is euvolemic on examination today.  I will follow-up an echocardiogram to reevaluate LV function.   I did encourage regular activity and he was willing to accept a referral to cardiac rehab, as this was previously discussed with him.  I will check a basic metabolic panel and CBC today to ensure stability, especially in light of additional doses of Lasix recently.  He otherwise remains on beta-blocker, Entresto, and spironolactone.  Consider SGLT2 inhibitor pending labs, and if not cost prohibitive.  2.  Coronary artery disease: As noted above, multivessel and small vessel disease noted on catheterization in 2019.  He has been medically managed.  He does not experience chest pain and has chronic dyspnea on exertion.  He remains on aspirin, statin, beta-blocker therapy.  3.  Essential hypertension: Blood pressure relatively stable today at 132/72.  He remains on beta-blocker, Entresto, and spironolactone therapy.  4.  Hyperlipidemia: Recent LDL of 31 in April.  He remains on statin therapy.  5.  Paroxysmal atrial fibrillation: AV paced today.  He denies palpitations.  He is chronically anticoagulated with warfarin.  He is due for INR follow-up tomorrow through our anticoagulation clinic and I will check this today given that we are checking his other labs.  6.  Type 2 diabetes mellitus: Recent A1c of 8.1.  Following up labs today.  If renal function stable, and not cost prohibitive, would strongly consider addition of an SGLT2 inhibitor.  7.  Disposition: Follow-up CBC, basic metabolic panel, and INR today.  Consider SGLT2 inhibitor.  Follow-up echocardiogram.  Referral to cardiac rehab.  Follow-up in clinic in 6 to 8 weeks or sooner if necessary.   Nicolasa Ducking, NP 07/30/2020, 8:33 AM

## 2020-07-30 NOTE — Progress Notes (Signed)
EPIC Encounter for ICM Monitoring  Patient Name: Adam Douglas is a 70 y.o. male Date: 07/30/2020 Primary Care Physican: Etheleen Nicks, NP Primary Cardiologist: Mariah Milling Electrophysiologist: Joycelyn Schmid Pacing: 96.6%         06/17/2020 Office Weight: 243 lbs                                                            Transmission reviewed.    Optivol thoracic impedance normal.   Prescribed:   Furosemide 40 mg Take one tablet (40mg ) by mouth once daily, you may take one additional tablet (40mg ) once daily as needed for swelling or increase shortness of breath. Spironolactone 25 mg take 0.5 tablet (12.5 mg total) daily   Labs: 04/26/2020 Creatinine 1.14, BUN 23, Potassium 3.9, Sodium 138, GFR 65-75   Recommendations:  No changes     Follow-up plan: ICM clinic phone appointment on 09/02/2020.    91 day device clinic remote transmission 09/13/2020.      EP/Cardiology Next Office Visit: Recall 03/09/2020 with Dr 11/13/2020.  Recall 11/19/2020 with Dr Graciela Husbands   Copy of ICM check sent to Dr. 11/21/2020.   3 month ICM trend: 07/29/2020.    1 Year ICM trend:       Graciela Husbands, RN 07/30/2020 10:34 AM

## 2020-07-30 NOTE — Patient Instructions (Signed)
Medication Instructions:  No changes at this time.  *If you need a refill on your cardiac medications before your next appointment, please call your pharmacy*   Lab Work: BMET, CBC, and INR done today.   If you have labs (blood work) drawn today and your tests are completely normal, you will receive your results only by: MyChart Message (if you have MyChart) OR A paper copy in the mail If you have any lab test that is abnormal or we need to change your treatment, we will call you to review the results.   Testing/Procedures: Your physician has requested that you have an echocardiogram.   Echocardiography is a painless test that uses sound waves to create images of your heart. It provides your doctor with information about the size and shape of your heart and how well your heart's chambers and valves are working. This procedure takes approximately one hour. There are no restrictions for this procedure.    Follow-Up: At [facility], you and your health needs are our priority.  As part of our continuing mission to provide you with exceptional heart care, we have created designated Provider Care Teams.  These Care Teams include your primary Cardiologist (physician) and Advanced Practice Providers (APPs -  Physician Assistants and Nurse Practitioners) who all work together to provide you with the care you need, when you need it.   Your next appointment:   6-8 week(s)  The format for your next appointment:   In Person  Provider:   Julien Nordmann, MD or Nicolasa Ducking, NP   Other Instructions Referral placed for Cardiac Rehab and they will reach out to you in a couple of weeks to set this up.

## 2020-07-31 ENCOUNTER — Ambulatory Visit (INDEPENDENT_AMBULATORY_CARE_PROVIDER_SITE_OTHER): Payer: Medicare HMO

## 2020-07-31 DIAGNOSIS — Z5181 Encounter for therapeutic drug level monitoring: Secondary | ICD-10-CM

## 2020-07-31 LAB — CBC
Hematocrit: 37.9 % (ref 37.5–51.0)
Hemoglobin: 12.9 g/dL — ABNORMAL LOW (ref 13.0–17.7)
MCH: 31.8 pg (ref 26.6–33.0)
MCHC: 34 g/dL (ref 31.5–35.7)
MCV: 93 fL (ref 79–97)
Platelets: 178 10*3/uL (ref 150–450)
RBC: 4.06 x10E6/uL — ABNORMAL LOW (ref 4.14–5.80)
RDW: 13.5 % (ref 11.6–15.4)
WBC: 8.6 10*3/uL (ref 3.4–10.8)

## 2020-07-31 LAB — BASIC METABOLIC PANEL
BUN/Creatinine Ratio: 18 (ref 10–24)
BUN: 22 mg/dL (ref 8–27)
CO2: 18 mmol/L — ABNORMAL LOW (ref 20–29)
Calcium: 8.5 mg/dL — ABNORMAL LOW (ref 8.6–10.2)
Chloride: 106 mmol/L (ref 96–106)
Creatinine, Ser: 1.21 mg/dL (ref 0.76–1.27)
Glucose: 89 mg/dL (ref 65–99)
Potassium: 4.1 mmol/L (ref 3.5–5.2)
Sodium: 140 mmol/L (ref 134–144)
eGFR: 64 mL/min/{1.73_m2} (ref >59)

## 2020-07-31 LAB — PROTIME-INR
INR: 2.2 — ABNORMAL HIGH (ref 0.9–1.2)
Prothrombin Time: 22.3 s — ABNORMAL HIGH (ref 9.1–12.0)

## 2020-07-31 NOTE — Patient Instructions (Signed)
-   continue dosage of warfarin dosage of 1 tablet (5 mg) every day except 1/2 TABLET (2.5 mg) on MONDAYS, WEDNESDAYS & FRIDAYS. - recheck in 5 weeks.

## 2020-08-01 ENCOUNTER — Other Ambulatory Visit: Payer: Self-pay | Admitting: *Deleted

## 2020-08-01 ENCOUNTER — Telehealth: Payer: Self-pay | Admitting: *Deleted

## 2020-08-01 DIAGNOSIS — I5022 Chronic systolic (congestive) heart failure: Secondary | ICD-10-CM

## 2020-08-01 LAB — COLOGUARD: COLOGUARD: NEGATIVE

## 2020-08-01 MED ORDER — EMPAGLIFLOZIN 10 MG PO TABS: 10.0000 mg | ORAL_TABLET | Freq: Every day | ORAL | 6 refills | Status: DC

## 2020-08-01 MED ORDER — DAPAGLIFLOZIN PROPANEDIOL 10 MG PO TABS: 10.0000 mg | ORAL_TABLET | Freq: Every day | ORAL | 6 refills | Status: DC

## 2020-08-01 NOTE — Telephone Encounter (Signed)
Spoke with pharmacy and cost for Adam Douglas would be $146.94. Advised per patients spouse to discontinue medications and will call her with update on cost.

## 2020-08-01 NOTE — Telephone Encounter (Signed)
Jardiance 10mg  daily is an acceptable alternate to farxiga.  Thanks.

## 2020-08-01 NOTE — Telephone Encounter (Signed)
Spoke with patients wife per release form and she states that medication was $150's. Advised that I can send in Proberta. We discussed cost and offered to call pharmacy to check on the cost of Farxigo. She verbalized understanding.

## 2020-08-01 NOTE — Addendum Note (Signed)
Addended by: Bryna Colander on: 08/01/2020 03:47 PM   Modules accepted: Orders

## 2020-08-01 NOTE — Telephone Encounter (Signed)
Reviewed results and recommendations with patients wife per release form. Confirmed pharmacy, sent in medication, and advised to call us if any problems with cost or questions. She verbalized understanding of our conversation, agreement with plan, and had no further questions at this time.

## 2020-08-01 NOTE — Telephone Encounter (Signed)
Spoke with patients wife per release form and reviewed cost of the [place] per patients pharmacy. Cost was $146.94 and that was too much as well. Canceled prescriptions and will update provider on these medications being cost prohibitive. She was grateful for checking on this with no further questions at this time.

## 2020-08-01 NOTE — Telephone Encounter (Signed)
-----   Message from Creig Hines, NP sent at 07/31/2020  8:36 AM EDT ----- Kidney function and electrolytes are normal.  Blood counts stable.  INR therapeutic at 2.2 (defer coumadin mgmt to anticoag clinic - copied here).   With stable kidney function and a history of heart failure, he would benefit from starting farxiga 10mg  daily.  Before agreeing, he may want to evaluate pricing through his insurance company.

## 2020-08-01 NOTE — Telephone Encounter (Signed)
Patient cost would be 150 dollars and this is not affordable.  Please call wife to discuss POC.

## 2020-08-27 ENCOUNTER — Encounter: Payer: Medicare HMO | Attending: Cardiovascular Disease

## 2020-08-29 ENCOUNTER — Other Ambulatory Visit: Payer: Medicare HMO

## 2020-09-02 ENCOUNTER — Ambulatory Visit (INDEPENDENT_AMBULATORY_CARE_PROVIDER_SITE_OTHER): Payer: Medicare HMO

## 2020-09-02 DIAGNOSIS — I5022 Chronic systolic (congestive) heart failure: Secondary | ICD-10-CM

## 2020-09-02 DIAGNOSIS — Z95810 Presence of automatic (implantable) cardiac defibrillator: Secondary | ICD-10-CM

## 2020-09-03 ENCOUNTER — Other Ambulatory Visit: Payer: Medicare HMO

## 2020-09-04 NOTE — Progress Notes (Signed)
EPIC Encounter for ICM Monitoring  Patient Name: Adam Douglas is a 70 y.o. male Date: 09/04/2020 Primary Care Physican: Etheleen Nicks, NP Primary Cardiologist: Mariah Milling Electrophysiologist: Joycelyn Schmid Pacing: 96.3%         07/30/2020 Office Weight: 243 lbs                                                            Transmission reviewed.    Optivol thoracic impedance normal.   Prescribed:   Furosemide 40 mg Take one tablet (40mg ) by mouth once daily, you may take one additional tablet (40mg ) once daily as needed for swelling or increase shortness of breath. Spironolactone 25 mg take 0.5 tablet (12.5 mg total) daily   Labs: 07/30/2020 Creatinine 1.21, BUN 22, Potassium 4.1, Sodium 140, GFR 64 04/26/2020 Creatinine 1.14, BUN 23, Potassium 3.9, Sodium 138, GFR 65-75 A complete set of results can be found in Results Review.   Recommendations:  No changes     Follow-up plan: ICM clinic phone appointment on 10/14/2020.    91 day device clinic remote transmission 09/13/2020.      EP/Cardiology Next Office Visit: Recall 03/09/2020 with Dr 11/13/2020.  Recall 11/19/2020 with Dr Graciela Husbands   Copy of ICM check sent to Dr. 11/21/2020.    3 month ICM trend: 09/02/2020.    1 Year ICM trend:       Graciela Husbands, RN 09/04/2020 9:38 AM

## 2020-09-11 ENCOUNTER — Other Ambulatory Visit: Payer: Self-pay

## 2020-09-11 ENCOUNTER — Ambulatory Visit (INDEPENDENT_AMBULATORY_CARE_PROVIDER_SITE_OTHER): Payer: Medicare HMO

## 2020-09-11 ENCOUNTER — Encounter: Payer: Medicare HMO | Attending: Cardiovascular Disease | Admitting: *Deleted

## 2020-09-11 DIAGNOSIS — I480 Paroxysmal atrial fibrillation: Secondary | ICD-10-CM | POA: Diagnosis not present

## 2020-09-11 DIAGNOSIS — I5022 Chronic systolic (congestive) heart failure: Secondary | ICD-10-CM | POA: Insufficient documentation

## 2020-09-11 DIAGNOSIS — Z5181 Encounter for therapeutic drug level monitoring: Secondary | ICD-10-CM | POA: Diagnosis not present

## 2020-09-11 LAB — POCT INR: INR: 2.2 (ref 2.0–3.0)

## 2020-09-11 NOTE — Progress Notes (Signed)
Initial telephone orientation completed. Diagnosis can be found in [facility] 7/26. EP orientation scheduled for Wednesday 9/14 at 8am.

## 2020-09-11 NOTE — Patient Instructions (Signed)
-   continue dosage of warfarin dosage of 1 tablet (5 mg) every day except 1/2 TABLET (2.5 mg) on MONDAYS, WEDNESDAYS & FRIDAYS. - recheck in 6 weeks.

## 2020-09-13 ENCOUNTER — Ambulatory Visit: Payer: Medicare HMO

## 2020-09-13 ENCOUNTER — Ambulatory Visit (INDEPENDENT_AMBULATORY_CARE_PROVIDER_SITE_OTHER): Payer: Medicare HMO

## 2020-09-13 ENCOUNTER — Other Ambulatory Visit: Payer: Self-pay

## 2020-09-13 DIAGNOSIS — I5020 Unspecified systolic (congestive) heart failure: Secondary | ICD-10-CM

## 2020-09-13 LAB — ECHOCARDIOGRAM COMPLETE
AR max vel: 2.86 cm2
AV Area VTI: 3.19 cm2
AV Area mean vel: 2.76 cm2
AV Mean grad: 3 mmHg
AV Peak grad: 5.8 mmHg
Ao pk vel: 1.2 m/s
Area-P 1/2: 2.62 cm2
S' Lateral: 4.4 cm

## 2020-09-13 MED ORDER — PERFLUTREN LIPID MICROSPHERE
1.0000 mL | INTRAVENOUS | Status: AC | PRN
  Administered 2020-09-13: 2 mL via INTRAVENOUS

## 2020-09-16 ENCOUNTER — Other Ambulatory Visit: Payer: Self-pay | Admitting: Cardiovascular Disease

## 2020-09-17 ENCOUNTER — Ambulatory Visit: Payer: Medicare HMO | Admitting: Nurse Practitioner

## 2020-09-17 NOTE — Telephone Encounter (Signed)
Refill Request.  

## 2020-09-18 ENCOUNTER — Telehealth: Payer: Self-pay

## 2020-09-18 NOTE — Telephone Encounter (Signed)
Attempted to reach out to pt, unable to make contact LMTCB for ECHO results  

## 2020-09-18 NOTE — Telephone Encounter (Signed)
Creig Hines, NP  09/16/2020 10:19 AM EDT     Heart squeezing function is low-normal at 50-55%.  The heart is mildly stiff.  The mitral valve is mildly leaky.  Overall, this is a significant improvement compared to prior echo in 03/2018.  At that time, heart squeeze was only 30-35%.    I spoke with the patient's wife regarding his echo results (ok per DPR).  She voices understanding of these results and is agreeable.

## 2020-09-18 NOTE — Telephone Encounter (Signed)
Patient calling to discuss recent echo testing results  ° °Please call  ° °

## 2020-09-27 ENCOUNTER — Encounter: Payer: Self-pay | Admitting: Nurse Practitioner

## 2020-09-27 ENCOUNTER — Ambulatory Visit: Payer: Medicare HMO | Admitting: Nurse Practitioner

## 2020-09-27 NOTE — Progress Notes (Deleted)
Office Visit    Patient Name: Adam Douglas Date of Encounter: 09/27/2020  Primary Care Provider:  Etheleen Nicks, NP Primary Cardiologist:  Julien Nordmann, MD  Chief Complaint    70 year old male with a history of CAD, chronic HFrEF, ischemic cardiomyopathy status post biventricular AICD, diabetes, hypertension, hyperlipidemia, ocular strokes, obesity, and paroxysmal atrial fibrillation, who presents for follow-up of heart failure and CAD.  Past Medical History    Past Medical History:  Diagnosis Date   CAD (coronary artery disease)    a. 02/2017 NSTEMI/Cath: LM 40d, LAD 61m, 26m/d, D2 small w/ sev prox/m dzs, LCX nl, OM1 sev diff dzs - small vessel, OM2 80-90p/m, RCA dominant 62m, RPDA 50, EF <20%-->Med Rx.   Diabetes mellitus without complication (HCC)    HFrEF (heart failure with reduced ejection fraction) (HCC)    a. 02/2017 Echo: EF 20-25%; b. 03/2018 Echo: EF 30-35%, mild conc LVH. Impaired relaxation. Nl RV fxn. Mild BAE. Mild to mod MR. Mild Ao root dil.   Hyperlipidemia    Hypertension    Ischemic cardiomyopathy    a. 02/2017 Echo: EF 20-25%; b. 03/2018 Echo: EF 30-35%; c. 03/2018 s/p MDT [id number] [facility] MRI CRT-D.   LBBB (left bundle branch block)    a. Noted 02/2017. Pt denies any known prior history of LBBB (not present on 2014 [facility] ECG interpretation).   Morbid obesity (HCC)    Stroke (HCC)    a. Ocular strokes x 3 - prev eval in [facility] for first 2, [facility] for last one.   Past Surgical History:  Procedure Laterality Date   BIV ICD INSERTION CRT-D N/A 03/18/2018   Procedure: BIV ICD INSERTION CRT-D;  Surgeon: Duke Salvia, MD;  Location: [facility] INVASIVE CV LAB;  Service: Cardiovascular;  Laterality: N/A;   CERVICAL DISC SURGERY     CHOLECYSTECTOMY     LEFT HEART CATH AND CORONARY ANGIOGRAPHY N/A 03/03/2017   Procedure: LEFT HEART CATH AND CORONARY ANGIOGRAPHY;  Surgeon: Antonieta Iba, MD;  Location: [facility] INVASIVE CV LAB;  Service: Cardiovascular;  Laterality:  N/A;    Allergies  No Known Allergies  History of Present Illness    69 year old male with the above complex past medical history including CAD, chronic HFrEF, ischemic cardiomyopathy status post biventricular AICD, diabetes, hypertension, hyperlipidemia, ocular strokes, obesity, and paroxysmal atrial fibrillation.  He suffered a non-STEMI in February 2019, with diagnostic catheterization revealing moderate to severe diffuse, small vessel coronary artery disease with an EF of less than 20% by ventriculography.  Echo showed an EF of 20-25%.  Medical therapy was recommended given diffuse nature of disease.  Despite guideline directed medical therapy, patient continued to have LV dysfunction with an EF of 30 to 35% by echo in March 2020.  He subsequently underwent CRT-D in March 2020.  He was subsequently diagnosed paroxysmal atrial fibrillation and has been anticoagulated with warfarin.  Patient was last seen in cardiology clinic on July 30, 2020, at which time he noted dyspnea with relatively minimal exertion despite recently increasing Lasix dose.  Weight was stable and he was euvolemic on examination.  Lab work was stable that day and I recommended initiating Farxiga 10 mg daily.  Follow-up echo September 9 showed improvement in LV dysfunction to 50%, grade 1 diastolic dysfunction, and mild mitral regurgitation.  Home Medications    Current Outpatient Medications  Medication Sig Dispense Refill   acetaminophen (TYLENOL) 650 MG CR tablet Take 1,300 mg by mouth 2 (two) times daily.  aspirin EC 81 MG tablet Take 1 tablet (81 mg total) by mouth daily. 90 tablet 3   atorvastatin (LIPITOR) 80 MG tablet Take 80 mg by mouth daily.     carvedilol (COREG) 12.5 MG tablet Take 1 tablet by mouth twice daily 180 tablet 3   citalopram (CELEXA) 40 MG tablet Take 40 mg by mouth daily.     cyclobenzaprine (FLEXERIL) 10 MG tablet Take by mouth.     furosemide (LASIX) 40 MG tablet Take one tablet (40mg ) by  mouth once daily, you may take one additional tablet (40mg ) once daily as needed for swelling or increase shortness of breath. 135 tablet 3   insulin aspart (NOVOLOG) 100 UNIT/ML injection Inject 15 Units into the skin 3 (three) times daily with meals.     insulin detemir (LEVEMIR) 100 UNIT/ML injection Inject 20 Units into the skin as needed.     metFORMIN (GLUCOPHAGE-XR) 500 MG 24 hr tablet Take 500 mg by mouth 2 (two) times daily.     nitroGLYCERIN (NITROSTAT) 0.4 MG SL tablet PLACE 1 TABLET UNDER THE TONGUE EVERY 5 MINUTES AS NEEDED 25 tablet 0   omeprazole (PRILOSEC) 20 MG capsule Take 20 mg by mouth 2 (two) times daily before a meal.     sacubitril-valsartan (ENTRESTO) 49-51 MG Take 1 tablet by mouth 2 (two) times daily. 180 tablet 3   spironolactone (ALDACTONE) 25 MG tablet Take 1/2 (one-half) tablet by mouth once daily 45 tablet 3   traMADol (ULTRAM) 50 MG tablet Take 50 mg by mouth every 8 (eight) hours as needed for pain.     warfarin (COUMADIN) 5 MG tablet TAKE 1/2 TO 1 TABLET BY MOUTH ONCE DAILY AS  DIRECTED BY  THE  COUMADIN  CLINIC 90 tablet 0   No current facility-administered medications for this visit.     Review of Systems    ***.  All other systems reviewed and are otherwise negative except as noted above.  Physical Exam    VS:  There were no vitals taken for this visit. , BMI There is no height or weight on file to calculate BMI.     GEN: Well nourished, well developed, in no acute distress. HEENT: normal. Neck: Supple, no JVD, carotid bruits, or masses. Cardiac: RRR, no murmurs, rubs, or gallops. No clubbing, cyanosis, edema.  Radials/DP/PT 2+ and equal bilaterally.  Respiratory:  Respirations regular and unlabored, clear to auscultation bilaterally. GI: Soft, nontender, nondistended, BS + x 4. MS: no deformity or atrophy. Skin: warm and dry, no rash. Neuro:  Strength and sensation are intact. Psych: Normal affect.  Accessory Clinical Findings    ECG personally  reviewed by me today - *** - no acute changes.  Lab Results  Component Value Date   WBC 8.6 07/30/2020   HGB 12.9 (L) 07/30/2020   HCT 37.9 07/30/2020   MCV 93 07/30/2020   PLT 178 07/30/2020   Lab Results  Component Value Date   CREATININE 1.21 07/30/2020   BUN 22 07/30/2020   NA 140 07/30/2020   K 4.1 07/30/2020   CL 106 07/30/2020   CO2 18 (L) 07/30/2020   Lab Results  Component Value Date   ALT 21 11/30/2018   AST 19 11/30/2018   ALKPHOS 98 11/30/2018   BILITOT 0.3 11/30/2018   Lab Results  Component Value Date   CHOL 94 03/04/2017   HDL 35 (L) 03/04/2017   LDLCALC 36 03/04/2017   TRIG 117 03/04/2017   CHOLHDL 2.7 03/04/2017  Lab Results  Component Value Date   HGBA1C 7.1 (H) 03/03/2017    Assessment & Plan    1.  ***   Nicolasa Ducking, NP 09/27/2020, 7:48 AM

## 2020-10-04 ENCOUNTER — Encounter: Payer: Self-pay | Admitting: Nurse Practitioner

## 2020-10-04 ENCOUNTER — Ambulatory Visit: Payer: Medicare HMO | Admitting: Nurse Practitioner

## 2020-10-04 NOTE — Progress Notes (Deleted)
Office Visit    Patient Name: Adam Douglas Date of Encounter: 10/04/2020  Primary Care Provider:  Etheleen Nicks, NP Primary Cardiologist:  Julien Nordmann, MD  Chief Complaint    70 year old male with a history of CAD, chronic HFrEF, ischemic cardiomyopathy status post biventricular AICD, diabetes, hypertension, hyperlipidemia, ocular strokes, obesity, and paroxysmal atrial fibrillation, who presents for follow-up of heart failure and CAD.  Past Medical History    Past Medical History:  Diagnosis Date   CAD (coronary artery disease)    a. 02/2017 NSTEMI/Cath: LM 40d, LAD 47m, 58m/d, D2 small w/ sev prox/m dzs, LCX nl, OM1 sev diff dzs - small vessel, OM2 80-90p/m, RCA dominant 57m, RPDA 50, EF <20%-->Med Rx.   Diabetes mellitus without complication (HCC)    HFrEF (heart failure with reduced ejection fraction) (HCC)    a. 02/2017 Echo: EF 20-25%; b. 03/2018 Echo: EF 30-35%, mild conc LVH. Impaired relaxation. Nl RV fxn. Mild BAE. Mild to mod MR. Mild Ao root dil; c. 09/2020 Echo: EF 50%, glob HK, mod LVH. GrI DD. Nl RV size/fxn. Mod dil LA. Mild MR.   Hyperlipidemia    Hypertension    Ischemic cardiomyopathy    a. 02/2017 Echo: EF 20-25%; b. 03/2018 Echo: EF 30-35%; c. 03/2018 s/p MDT [id number] [facility] CRT-D; d. 09/2020 Echo: EF 50%.   LBBB (left bundle branch block)    a. Noted 02/2017. Pt denies any known prior history of LBBB (not present on 2014 [facility] ECG interpretation).   Morbid obesity (HCC)    Stroke (HCC)    a. Ocular strokes x 3 - prev eval in [facility] for first 2, [facility] for last one.   Past Surgical History:  Procedure Laterality Date   BIV ICD INSERTION CRT-D N/A 03/18/2018   Procedure: BIV ICD INSERTION CRT-D;  Surgeon: Duke Salvia, MD;  Location: [facility] INVASIVE CV LAB;  Service: Cardiovascular;  Laterality: N/A;   CERVICAL DISC SURGERY     CHOLECYSTECTOMY     LEFT HEART CATH AND CORONARY ANGIOGRAPHY N/A 03/03/2017   Procedure: LEFT HEART CATH AND CORONARY  ANGIOGRAPHY;  Surgeon: Antonieta Iba, MD;  Location: [facility] INVASIVE CV LAB;  Service: Cardiovascular;  Laterality: N/A;    Allergies  No Known Allergies  History of Present Illness    70 year old male with the above complex past medical history including CAD, chronic HFrEF, ischemic cardiomyopathy status post biventricular AICD, diabetes, hypertension, hyperlipidemia, ocular strokes, obesity, and paroxysmal atrial fibrillation.  He suffered a non-STEMI in February 2019, with diagnostic catheterization revealing moderate to severe diffuse, small vessel coronary artery disease with an EF of less than 20% by ventriculography.  Echo showed an EF of 20-25%.  Medical therapy was recommended given diffuse nature of disease.  Despite guideline directed medical therapy, patient continued to have LV dysfunction with an EF of 30 to 35% by echo in March 2020.  He subsequently underwent CRT-D in March 2020.  He was subsequently diagnosed paroxysmal atrial fibrillation and has been anticoagulated with warfarin.   Patient was last seen in cardiology clinic on July 30, 2020, at which time he noted dyspnea with relatively minimal exertion despite recently increasing Lasix dose.  Weight was stable and he was euvolemic on examination.  Lab work was stable that day and I recommended initiating Farxiga 10 mg daily.  Follow-up echo September 9 showed improvement in LV dysfunction to 50%, grade 1 diastolic dysfunction, and mild mitral regurgitation.  Home Medications    Current Outpatient  Medications  Medication Sig Dispense Refill   acetaminophen (TYLENOL) 650 MG CR tablet Take 1,300 mg by mouth 2 (two) times daily.     aspirin EC 81 MG tablet Take 1 tablet (81 mg total) by mouth daily. 90 tablet 3   atorvastatin (LIPITOR) 80 MG tablet Take 80 mg by mouth daily.     carvedilol (COREG) 12.5 MG tablet Take 1 tablet by mouth twice daily 180 tablet 3   citalopram (CELEXA) 40 MG tablet Take 40 mg by mouth daily.      cyclobenzaprine (FLEXERIL) 10 MG tablet Take by mouth.     furosemide (LASIX) 40 MG tablet Take one tablet (40mg ) by mouth once daily, you may take one additional tablet (40mg ) once daily as needed for swelling or increase shortness of breath. 135 tablet 3   insulin aspart (NOVOLOG) 100 UNIT/ML injection Inject 15 Units into the skin 3 (three) times daily with meals.     insulin detemir (LEVEMIR) 100 UNIT/ML injection Inject 20 Units into the skin as needed.     metFORMIN (GLUCOPHAGE-XR) 500 MG 24 hr tablet Take 500 mg by mouth 2 (two) times daily.     nitroGLYCERIN (NITROSTAT) 0.4 MG SL tablet PLACE 1 TABLET UNDER THE TONGUE EVERY 5 MINUTES AS NEEDED 25 tablet 0   omeprazole (PRILOSEC) 20 MG capsule Take 20 mg by mouth 2 (two) times daily before a meal.     sacubitril-valsartan (ENTRESTO) 49-51 MG Take 1 tablet by mouth 2 (two) times daily. 180 tablet 3   spironolactone (ALDACTONE) 25 MG tablet Take 1/2 (one-half) tablet by mouth once daily 45 tablet 3   traMADol (ULTRAM) 50 MG tablet Take 50 mg by mouth every 8 (eight) hours as needed for pain.     warfarin (COUMADIN) 5 MG tablet TAKE 1/2 TO 1 TABLET BY MOUTH ONCE DAILY AS  DIRECTED BY  THE  COUMADIN  CLINIC 90 tablet 0   No current facility-administered medications for this visit.     Review of Systems    ***.  All other systems reviewed and are otherwise negative except as noted above.  Physical Exam    VS:  There were no vitals taken for this visit. , BMI There is no height or weight on file to calculate BMI.     GEN: Well nourished, well developed, in no acute distress. HEENT: normal. Neck: Supple, no JVD, carotid bruits, or masses. Cardiac: RRR, no murmurs, rubs, or gallops. No clubbing, cyanosis, edema.  Radials/DP/PT 2+ and equal bilaterally.  Respiratory:  Respirations regular and unlabored, clear to auscultation bilaterally. GI: Soft, nontender, nondistended, BS + x 4. MS: no deformity or atrophy. Skin: warm and dry, no  rash. Neuro:  Strength and sensation are intact. Psych: Normal affect.  Accessory Clinical Findings    ECG personally reviewed by me today - *** - no acute changes.  Lab Results  Component Value Date   WBC 8.6 07/30/2020   HGB 12.9 (L) 07/30/2020   HCT 37.9 07/30/2020   MCV 93 07/30/2020   PLT 178 07/30/2020   Lab Results  Component Value Date   CREATININE 1.21 07/30/2020   BUN 22 07/30/2020   NA 140 07/30/2020   K 4.1 07/30/2020   CL 106 07/30/2020   CO2 18 (L) 07/30/2020   Lab Results  Component Value Date   ALT 21 11/30/2018   AST 19 11/30/2018   ALKPHOS 98 11/30/2018   BILITOT 0.3 11/30/2018   Lab Results  Component Value Date  CHOL 94 03/04/2017   HDL 35 (L) 03/04/2017   LDLCALC 36 03/04/2017   TRIG 117 03/04/2017   CHOLHDL 2.7 03/04/2017    Lab Results  Component Value Date   HGBA1C 7.1 (H) 03/03/2017    Assessment & Plan    1.  ***   Nicolasa Ducking, NP 10/04/2020, 9:26 AM

## 2020-10-14 ENCOUNTER — Ambulatory Visit (INDEPENDENT_AMBULATORY_CARE_PROVIDER_SITE_OTHER): Payer: Medicare HMO

## 2020-10-14 DIAGNOSIS — Z95810 Presence of automatic (implantable) cardiac defibrillator: Secondary | ICD-10-CM | POA: Diagnosis not present

## 2020-10-14 DIAGNOSIS — I5022 Chronic systolic (congestive) heart failure: Secondary | ICD-10-CM | POA: Diagnosis not present

## 2020-10-18 NOTE — Progress Notes (Signed)
EPIC Encounter for ICM Monitoring  Patient Name: Adam Douglas is a 71 y.o. male Date: 10/18/2020 Primary Care Physican: Etheleen Nicks, NP Primary Cardiologist: Mariah Milling Electrophysiologist: Joycelyn Schmid Pacing: 95.5%         07/30/2020 Office Weight: 243 lbs                                                            Transmission reviewed.    Optivol thoracic impedance normal.   Prescribed:   Furosemide 40 mg Take one tablet (40mg ) by mouth once daily, you may take one additional tablet (40mg ) once daily as needed for swelling or increase shortness of breath. Spironolactone 25 mg take 0.5 tablet (12.5 mg total) daily   Labs: 07/30/2020 Creatinine 1.21, BUN 22, Potassium 4.1, Sodium 140, GFR 64 04/26/2020 Creatinine 1.14, BUN 23, Potassium 3.9, Sodium 138, GFR 65-75 A complete set of results can be found in Results Review.   Recommendations:  No changes     Follow-up plan: ICM clinic phone appointment on 11/18/2020.    91 day device clinic remote transmission 12/13/2020.      EP/Cardiology Next Office Visit:  11/01/2020 with 14/09/2020, NP.   Recall 03/09/2020 with Dr Ward Givens.  Recall 11/19/2020 with Dr Graciela Husbands   Copy of ICM check sent to Dr. 11/21/2020.     3 month ICM trend: 10/14/2020.    1 Year ICM trend:       Graciela Husbands, RN 10/18/2020 8:57 AM

## 2020-10-30 ENCOUNTER — Other Ambulatory Visit: Payer: Self-pay

## 2020-10-30 ENCOUNTER — Ambulatory Visit (INDEPENDENT_AMBULATORY_CARE_PROVIDER_SITE_OTHER): Payer: Medicare HMO

## 2020-10-30 DIAGNOSIS — Z5181 Encounter for therapeutic drug level monitoring: Secondary | ICD-10-CM

## 2020-10-30 DIAGNOSIS — I480 Paroxysmal atrial fibrillation: Secondary | ICD-10-CM | POA: Diagnosis not present

## 2020-10-30 LAB — POCT INR: INR: 1.7 — AB (ref 2.0–3.0)

## 2020-10-30 NOTE — Patient Instructions (Signed)
-   take 1 whole tablet tonight, then - continue dosage of warfarin dosage of 1 tablet (5 mg) every day except 1/2 TABLET (2.5 mg) on MONDAYS, WEDNESDAYS & FRIDAYS. - recheck in 4 weeks.

## 2020-11-01 ENCOUNTER — Ambulatory Visit: Payer: Medicare HMO | Admitting: Nurse Practitioner

## 2020-11-01 NOTE — Progress Notes (Deleted)
Office Visit    Patient Name: Adam Douglas Date of Encounter: 11/01/2020  Primary Care Provider:  Etheleen Nicks, NP Primary Cardiologist:  Julien Nordmann, MD  Chief Complaint    70 year old male with a history of CAD, chronic HFrEF, ischemic cardiomyopathy status post biventricular AICD, diabetes, hypertension, hyperlipidemia, ocular strokes, obesity, proximal atrial fibrillation, who presents for follow-up of heart failure and CAD.  Past Medical History    Past Medical History:  Diagnosis Date   CAD (coronary artery disease)    a. 02/2017 NSTEMI/Cath: LM 40d, LAD 51m, 70m/d, D2 small w/ sev prox/m dzs, LCX nl, OM1 sev diff dzs - small vessel, OM2 80-90p/m, RCA dominant 43m, RPDA 50, EF <20%-->Med Rx.   Diabetes mellitus without complication (HCC)    HFrEF (heart failure with reduced ejection fraction) (HCC)    a. 02/2017 Echo: EF 20-25%; b. 03/2018 Echo: EF 30-35%, mild conc LVH. Impaired relaxation. Nl RV fxn. Mild BAE. Mild to mod MR. Mild Ao root dil; c. 09/2020 Echo: EF 50%, glob HK, mod LVH. GrI DD. Nl RV size/fxn. Mod dil LA. Mild MR.   Hyperlipidemia    Hypertension    Ischemic cardiomyopathy    a. 02/2017 Echo: EF 20-25%; b. 03/2018 Echo: EF 30-35%; c. 03/2018 s/p MDT [id number] [facility] MRI CRT-D; d. 09/2020 Echo: EF 50%.   LBBB (left bundle branch block)    a. Noted 02/2017. Pt denies any known prior history of LBBB (not present on 2014 [facility] ECG interpretation).   Morbid obesity (HCC)    Stroke (HCC)    a. Ocular strokes x 3 - prev eval in [facility] for first 2, [facility] for last one.   Past Surgical History:  Procedure Laterality Date   BIV ICD INSERTION CRT-D N/A 03/18/2018   Procedure: BIV ICD INSERTION CRT-D;  Surgeon: Duke Salvia, MD;  Location: [facility] INVASIVE CV LAB;  Service: Cardiovascular;  Laterality: N/A;   CERVICAL DISC SURGERY     CHOLECYSTECTOMY     LEFT HEART CATH AND CORONARY ANGIOGRAPHY N/A 03/03/2017   Procedure: LEFT HEART CATH AND CORONARY ANGIOGRAPHY;   Surgeon: Antonieta Iba, MD;  Location: [facility] INVASIVE CV LAB;  Service: Cardiovascular;  Laterality: N/A;    Allergies  No Known Allergies  History of Present Illness    70 year old male with the above complex past medical history including CAD, chronic HFrEF, ischemic cardiomyopathy status post biventricular AICD, diabetes, hypertension, hyperlipidemia, ocular strokes, obesity, and paroxysmal atrial fibrillation.  He suffered a non-STEMI in February 2019, with diagnostic catheterization revealing moderate to severe diffuse, small vessel CAD with an EF of less than 20% by ventriculography.  Echo showed an EF of 20 to 25%.  Medical therapy was recommended given diffuse nature of disease.  Despite guideline directed medical therapy, patient continued to have LV dysfunction with an EF of 30 to 35% by echo in March 2020.  He subsequently underwent CRT-D in March 2020.  He was subsequently diagnosed with paroxysmal atrial fibrillation and has been anticoagulated with warfarin.  He was last seen in cardiology clinic in July of this year, at which time he noted dyspnea with relatively minimal exertion despite recently increasing Lasix dose.  Weight was stable and he was euvolemic on examination.  Lab work was stable that day and I recommended initiating Farxiga 10 mg daily.  Follow-up echo September 13, 2020, showed improvement in LV dysfunction to 50% with grade 1 diastolic dysfunction, and mild mitral regurgitation.  He had a remote device  check on October 10 and was noted to have a normal OptiVol with recommendation to continue Lasix 40 mg daily and spironolactone 12.5 mg daily.  Home Medications    Current Outpatient Medications  Medication Sig Dispense Refill   acetaminophen (TYLENOL) 650 MG CR tablet Take 1,300 mg by mouth 2 (two) times daily.     aspirin EC 81 MG tablet Take 1 tablet (81 mg total) by mouth daily. 90 tablet 3   atorvastatin (LIPITOR) 80 MG tablet Take 80 mg by mouth daily.      carvedilol (COREG) 12.5 MG tablet Take 1 tablet by mouth twice daily 180 tablet 3   citalopram (CELEXA) 40 MG tablet Take 40 mg by mouth daily.     cyclobenzaprine (FLEXERIL) 10 MG tablet Take by mouth.     furosemide (LASIX) 40 MG tablet Take one tablet (40mg ) by mouth once daily, you may take one additional tablet (40mg ) once daily as needed for swelling or increase shortness of breath. 135 tablet 3   insulin aspart (NOVOLOG) 100 UNIT/ML injection Inject 15 Units into the skin 3 (three) times daily with meals.     insulin detemir (LEVEMIR) 100 UNIT/ML injection Inject 20 Units into the skin as needed.     metFORMIN (GLUCOPHAGE-XR) 500 MG 24 hr tablet Take 500 mg by mouth 2 (two) times daily.     nitroGLYCERIN (NITROSTAT) 0.4 MG SL tablet PLACE 1 TABLET UNDER THE TONGUE EVERY 5 MINUTES AS NEEDED 25 tablet 0   omeprazole (PRILOSEC) 20 MG capsule Take 20 mg by mouth 2 (two) times daily before a meal.     sacubitril-valsartan (ENTRESTO) 49-51 MG Take 1 tablet by mouth 2 (two) times daily. 180 tablet 3   spironolactone (ALDACTONE) 25 MG tablet Take 1/2 (one-half) tablet by mouth once daily 45 tablet 3   traMADol (ULTRAM) 50 MG tablet Take 50 mg by mouth every 8 (eight) hours as needed for pain.     warfarin (COUMADIN) 5 MG tablet TAKE 1/2 TO 1 TABLET BY MOUTH ONCE DAILY AS  DIRECTED BY  THE  COUMADIN  CLINIC 90 tablet 0   No current facility-administered medications for this visit.     Review of Systems    ***.  All other systems reviewed and are otherwise negative except as noted above.  Physical Exam    VS:  There were no vitals taken for this visit. , BMI There is no height or weight on file to calculate BMI.     GEN: Well nourished, well developed, in no acute distress. HEENT: normal. Neck: Supple, no JVD, carotid bruits, or masses. Cardiac: RRR, no murmurs, rubs, or gallops. No clubbing, cyanosis, edema.  Radials/DP/PT 2+ and equal bilaterally.  Respiratory:  Respirations regular and  unlabored, clear to auscultation bilaterally. GI: Soft, nontender, nondistended, BS + x 4. MS: no deformity or atrophy. Skin: warm and dry, no rash. Neuro:  Strength and sensation are intact. Psych: Normal affect.  Accessory Clinical Findings    ECG personally reviewed by me today - *** - no acute changes.  Lab Results  Component Value Date   WBC 8.6 07/30/2020   HGB 12.9 (L) 07/30/2020   HCT 37.9 07/30/2020   MCV 93 07/30/2020   PLT 178 07/30/2020   Lab Results  Component Value Date   CREATININE 1.21 07/30/2020   BUN 22 07/30/2020   NA 140 07/30/2020   K 4.1 07/30/2020   CL 106 07/30/2020   CO2 18 (L) 07/30/2020   Lab  Results  Component Value Date   ALT 21 11/30/2018   AST 19 11/30/2018   ALKPHOS 98 11/30/2018   BILITOT 0.3 11/30/2018   Lab Results  Component Value Date   CHOL 94 03/04/2017   HDL 35 (L) 03/04/2017   LDLCALC 36 03/04/2017   TRIG 117 03/04/2017   CHOLHDL 2.7 03/04/2017    Lab Results  Component Value Date   HGBA1C 7.1 (H) 03/03/2017    Assessment & Plan    1.  ***   Nicolasa Ducking, NP 11/01/2020, 10:58 AM

## 2020-11-18 ENCOUNTER — Ambulatory Visit (INDEPENDENT_AMBULATORY_CARE_PROVIDER_SITE_OTHER): Payer: Medicare HMO

## 2020-11-18 DIAGNOSIS — Z95810 Presence of automatic (implantable) cardiac defibrillator: Secondary | ICD-10-CM

## 2020-11-18 DIAGNOSIS — I5022 Chronic systolic (congestive) heart failure: Secondary | ICD-10-CM | POA: Diagnosis not present

## 2020-11-22 NOTE — Progress Notes (Signed)
EPIC Encounter for ICM Monitoring  Patient Name: Adam Douglas is a 70 y.o. male Date: 11/22/2020 Primary Care Physican: Etheleen Nicks, NP Primary Cardiologist: Mariah Milling Electrophysiologist: Joycelyn Schmid Pacing: 95.5%         07/30/2020 Office Weight: 243 lbs                                                            Transmission reviewed.    Optivol thoracic impedance normal.   Prescribed:   Furosemide 40 mg Take one tablet (40mg ) by mouth once daily, you may take one additional tablet (40mg ) once daily as needed for swelling or increase shortness of breath. Spironolactone 25 mg take 0.5 tablet (12.5 mg total) daily   Labs: 07/30/2020 Creatinine 1.21, BUN 22, Potassium 4.1, Sodium 140, GFR 64 04/26/2020 Creatinine 1.14, BUN 23, Potassium 3.9, Sodium 138, GFR 65-75 A complete set of results can be found in Results Review.   Recommendations:  No changes     Follow-up plan: ICM clinic phone appointment on 12/23/2020.    91 day device clinic remote transmission 12/13/2020.      EP/Cardiology Next Office Visit:    Recall 03/09/2020 with Dr 14/09/2020.  Recall 11/19/2020 with Dr Graciela Husbands   Copy of ICM check sent to Dr. 11/21/2020.   3 month ICM trend: 11/18/2020.    12-14 Month ICM trend:       Graciela Husbands, RN 11/22/2020 1:23 PM

## 2020-11-27 ENCOUNTER — Other Ambulatory Visit: Payer: Self-pay

## 2020-11-27 ENCOUNTER — Ambulatory Visit (INDEPENDENT_AMBULATORY_CARE_PROVIDER_SITE_OTHER): Payer: Medicare HMO

## 2020-11-27 DIAGNOSIS — I480 Paroxysmal atrial fibrillation: Secondary | ICD-10-CM | POA: Diagnosis not present

## 2020-11-27 DIAGNOSIS — Z5181 Encounter for therapeutic drug level monitoring: Secondary | ICD-10-CM

## 2020-11-27 LAB — POCT INR: INR: 2.5 (ref 2.0–3.0)

## 2020-11-27 NOTE — Patient Instructions (Signed)
-   continue dosage of warfarin dosage of 1 tablet (5 mg) every day except 1/2 TABLET (2.5 mg) on MONDAYS, WEDNESDAYS & FRIDAYS. - recheck in 6 weeks.

## 2020-12-13 ENCOUNTER — Ambulatory Visit (INDEPENDENT_AMBULATORY_CARE_PROVIDER_SITE_OTHER): Payer: Medicare HMO

## 2020-12-13 DIAGNOSIS — I255 Ischemic cardiomyopathy: Secondary | ICD-10-CM

## 2020-12-13 LAB — CUP PACEART REMOTE DEVICE CHECK
Battery Remaining Longevity: 82 mo
Battery Voltage: 3 V
Brady Statistic AP VP Percent: 3.44 %
Brady Statistic AP VS Percent: 0.06 %
Brady Statistic AS VP Percent: 95.16 %
Brady Statistic AS VS Percent: 1.35 %
Brady Statistic RA Percent Paced: 3.49 %
Brady Statistic RV Percent Paced: 14.03 %
Date Time Interrogation Session: 20221209022824
HighPow Impedance: 70 Ohm
Implantable Lead Implant Date: 20200313
Implantable Lead Implant Date: 20200313
Implantable Lead Implant Date: 20200313
Implantable Lead Location: 753858
Implantable Lead Location: 753859
Implantable Lead Location: 753860
Implantable Lead Model: 4396
Implantable Lead Model: 5076
Implantable Lead Model: 6935
Implantable Pulse Generator Implant Date: 20200313
Lead Channel Impedance Value: 285 Ohm
Lead Channel Impedance Value: 342 Ohm
Lead Channel Impedance Value: 361 Ohm
Lead Channel Impedance Value: 418 Ohm
Lead Channel Impedance Value: 532 Ohm
Lead Channel Impedance Value: 760 Ohm
Lead Channel Pacing Threshold Amplitude: 0.625 V
Lead Channel Pacing Threshold Amplitude: 0.875 V
Lead Channel Pacing Threshold Amplitude: 1.375 V
Lead Channel Pacing Threshold Pulse Width: 0.4 ms
Lead Channel Pacing Threshold Pulse Width: 0.4 ms
Lead Channel Pacing Threshold Pulse Width: 0.4 ms
Lead Channel Sensing Intrinsic Amplitude: 2.5 mV
Lead Channel Sensing Intrinsic Amplitude: 2.5 mV
Lead Channel Sensing Intrinsic Amplitude: 9 mV
Lead Channel Sensing Intrinsic Amplitude: 9 mV
Lead Channel Setting Pacing Amplitude: 1.5 V
Lead Channel Setting Pacing Amplitude: 2 V
Lead Channel Setting Pacing Amplitude: 2 V
Lead Channel Setting Pacing Pulse Width: 0.4 ms
Lead Channel Setting Pacing Pulse Width: 0.4 ms
Lead Channel Setting Sensing Sensitivity: 0.3 mV

## 2020-12-23 ENCOUNTER — Ambulatory Visit (INDEPENDENT_AMBULATORY_CARE_PROVIDER_SITE_OTHER): Payer: Medicare HMO

## 2020-12-23 DIAGNOSIS — Z95810 Presence of automatic (implantable) cardiac defibrillator: Secondary | ICD-10-CM | POA: Diagnosis not present

## 2020-12-23 DIAGNOSIS — I5022 Chronic systolic (congestive) heart failure: Secondary | ICD-10-CM | POA: Diagnosis not present

## 2020-12-23 NOTE — Progress Notes (Signed)
Remote ICD transmission.   

## 2020-12-25 NOTE — Progress Notes (Signed)
EPIC Encounter for ICM Monitoring  Patient Name: Adam Douglas is a 70 y.o. male Date: 12/25/2020 Primary Care Physican: Etheleen Nicks, NP Primary Cardiologist: Mariah Milling Electrophysiologist: Joycelyn Schmid Pacing: 98.2%         11/22/2020 Office Weight: 238 lbs                                                            Transmission reviewed.    Optivol thoracic impedance normal.   Prescribed:   Furosemide 40 mg Take one tablet (40mg ) by mouth once daily, you may take one additional tablet (40mg ) once daily as needed for swelling or increase shortness of breath. Spironolactone 25 mg take 0.5 tablet (12.5 mg total) daily   Labs: 07/30/2020 Creatinine 1.21, BUN 22, Potassium 4.1, Sodium 140, GFR 64 04/26/2020 Creatinine 1.14, BUN 23, Potassium 3.9, Sodium 138, GFR 65-75 A complete set of results can be found in Results Review.   Recommendations:  No changes     Follow-up plan: ICM clinic phone appointment on 01/27/2021.    91 day device clinic remote transmission 03/14/2021.      EP/Cardiology Next Office Visit:    Recall 03/09/2020 with Dr 05/14/2021.  Recall 11/19/2020 with Dr Graciela Husbands.  Missed appointment with 11/21/2020 11/01/20 and not rescheduled.   Copy of ICM check sent to Dr. Ward Givens.   3 month ICM trend: 12/23/2020.    12-14 Month ICM trend:       Graciela Husbands, RN 12/25/2020 7:50 AM

## 2021-01-22 ENCOUNTER — Other Ambulatory Visit: Payer: Self-pay

## 2021-01-22 ENCOUNTER — Ambulatory Visit (INDEPENDENT_AMBULATORY_CARE_PROVIDER_SITE_OTHER): Payer: Medicare HMO

## 2021-01-22 DIAGNOSIS — Z5181 Encounter for therapeutic drug level monitoring: Secondary | ICD-10-CM

## 2021-01-22 DIAGNOSIS — I480 Paroxysmal atrial fibrillation: Secondary | ICD-10-CM

## 2021-01-22 LAB — POCT INR: INR: 1.3 — AB (ref 2.0–3.0)

## 2021-01-22 NOTE — Patient Instructions (Signed)
-   take extra 1/2 tablet warfarin today and tomorrow, then - continue dosage of warfarin dosage of 1 tablet (5 mg) every day except 1/2 TABLET (2.5 mg) on MONDAYS, WEDNESDAYS & FRIDAYS. - recheck in 2 weeks

## 2021-01-27 ENCOUNTER — Ambulatory Visit (INDEPENDENT_AMBULATORY_CARE_PROVIDER_SITE_OTHER): Payer: Medicare HMO

## 2021-01-27 DIAGNOSIS — I5022 Chronic systolic (congestive) heart failure: Secondary | ICD-10-CM

## 2021-01-27 DIAGNOSIS — Z95810 Presence of automatic (implantable) cardiac defibrillator: Secondary | ICD-10-CM

## 2021-01-28 NOTE — Progress Notes (Signed)
EPIC Encounter for ICM Monitoring  Patient Name: Adam Douglas is a 71 y.o. male Date: 01/28/2021 Primary Care Physican: Etheleen Nicks, NP Primary Cardiologist: Mariah Milling Electrophysiologist: Joycelyn Schmid Pacing: 98.1%         11/22/2020 Office Weight: 238 lbs                                                            Transmission reviewed.    Optivol thoracic impedance normal.   Prescribed:   Furosemide 40 mg Take one tablet (40mg ) by mouth once daily, you may take one additional tablet (40mg ) once daily as needed for swelling or increase shortness of breath. Spironolactone 25 mg take 0.5 tablet (12.5 mg total) daily   Labs: 07/30/2020 Creatinine 1.21, BUN 22, Potassium 4.1, Sodium 140, GFR 64 04/26/2020 Creatinine 1.14, BUN 23, Potassium 3.9, Sodium 138, GFR 65-75 A complete set of results can be found in Results Review.   Recommendations:  No changes     Follow-up plan: ICM clinic phone appointment on 03/03/2021.    91 day device clinic remote transmission 03/14/2021.      EP/Cardiology Next Office Visit:    Recall 03/09/2020 with Dr 05/14/2021.  Recall 11/19/2020 with Dr Graciela Husbands.  Missed appointment with 11/21/2020 11/01/20 and not rescheduled.   Copy of ICM check sent to Dr. Ward Givens.   3 month ICM trend: 01/27/2021.    12-14 Month ICM trend:     Graciela Husbands, RN 01/28/2021 1:34 PM

## 2021-02-05 ENCOUNTER — Other Ambulatory Visit: Payer: Self-pay

## 2021-02-05 ENCOUNTER — Ambulatory Visit (INDEPENDENT_AMBULATORY_CARE_PROVIDER_SITE_OTHER): Payer: Medicare HMO

## 2021-02-05 DIAGNOSIS — Z5181 Encounter for therapeutic drug level monitoring: Secondary | ICD-10-CM | POA: Diagnosis not present

## 2021-02-05 DIAGNOSIS — I480 Paroxysmal atrial fibrillation: Secondary | ICD-10-CM

## 2021-02-05 LAB — POCT INR: INR: 1.2 — AB (ref 2.0–3.0)

## 2021-02-05 NOTE — Patient Instructions (Signed)
-   START NEW dosage of warfarin dosage of 1 tablet (5 mg) every day  - recheck in 2 weeks

## 2021-03-03 ENCOUNTER — Ambulatory Visit (INDEPENDENT_AMBULATORY_CARE_PROVIDER_SITE_OTHER): Payer: Medicare HMO

## 2021-03-03 DIAGNOSIS — Z95810 Presence of automatic (implantable) cardiac defibrillator: Secondary | ICD-10-CM

## 2021-03-03 DIAGNOSIS — I5022 Chronic systolic (congestive) heart failure: Secondary | ICD-10-CM | POA: Diagnosis not present

## 2021-03-04 ENCOUNTER — Other Ambulatory Visit: Payer: Self-pay | Admitting: Cardiovascular Disease

## 2021-03-04 NOTE — Telephone Encounter (Signed)
Please schedule overdue F/U appointment for refills. Thank you! 

## 2021-03-04 NOTE — Telephone Encounter (Signed)
Last INR appt was 02/05/21 INR was 1.2 Pt was No Show for INR appt on 2/15 Pt is past due to see Dr Mariah Milling  Message sent to [facility] schedulers to make INR appt.  Refill approved for 15 tablets only.

## 2021-03-04 NOTE — Telephone Encounter (Signed)
Refill request for warfarin 

## 2021-03-07 NOTE — Progress Notes (Signed)
EPIC Encounter for ICM Monitoring  Patient Name: Adam Douglas is a 71 y.o. male Date: 03/07/2021 Primary Care Physican: Etheleen Nicks, NP Primary Cardiologist: Mariah Milling Electrophysiologist: Joycelyn Schmid Pacing: 98.1%         11/22/2020 Office Weight: 238 lbs                                                            Transmission reviewed.    Optivol thoracic impedance trending close to baseline.   Prescribed:   Furosemide 40 mg Take one tablet (40mg ) by mouth once daily, you may take one additional tablet (40mg ) once daily as needed for swelling or increase shortness of breath. Spironolactone 25 mg take 0.5 tablet (12.5 mg total) daily   Labs: 07/30/2020 Creatinine 1.21, BUN 22, Potassium 4.1, Sodium 140, GFR 64 04/26/2020 Creatinine 1.14, BUN 23, Potassium 3.9, Sodium 138, GFR 65-75 A complete set of results can be found in Results Review.   Recommendations:  No changes     Follow-up plan: ICM clinic phone appointment on 04/07/2021.    91 day device clinic remote transmission 03/14/2021.      EP/Cardiology Next Office Visit:    Recall 03/09/2020 with Dr 05/14/2021.  Recall 11/19/2020 with Dr Graciela Husbands.  04/02/2021 with Mariah Milling.   Copy of ICM check sent to Dr. 04/04/2021.    3 month ICM trend: 03/03/2021.    12-14 Month ICM trend:     Graciela Husbands, RN 03/07/2021 4:45 PM

## 2021-03-14 ENCOUNTER — Ambulatory Visit (INDEPENDENT_AMBULATORY_CARE_PROVIDER_SITE_OTHER): Payer: Medicare HMO

## 2021-03-14 DIAGNOSIS — I255 Ischemic cardiomyopathy: Secondary | ICD-10-CM | POA: Diagnosis not present

## 2021-03-14 DIAGNOSIS — I5022 Chronic systolic (congestive) heart failure: Secondary | ICD-10-CM

## 2021-03-14 LAB — CUP PACEART REMOTE DEVICE CHECK
Battery Remaining Longevity: 77 mo
Battery Voltage: 2.99 V
Brady Statistic AP VP Percent: 36.46 %
Brady Statistic AP VS Percent: 0.59 %
Brady Statistic AS VP Percent: 62.01 %
Brady Statistic AS VS Percent: 0.95 %
Brady Statistic RA Percent Paced: 36.95 %
Brady Statistic RV Percent Paced: 29.9 %
Date Time Interrogation Session: 20230310033424
HighPow Impedance: 63 Ohm
Implantable Lead Implant Date: 20200313
Implantable Lead Implant Date: 20200313
Implantable Lead Implant Date: 20200313
Implantable Lead Location: 753858
Implantable Lead Location: 753859
Implantable Lead Location: 753860
Implantable Lead Model: 4396
Implantable Lead Model: 5076
Implantable Lead Model: 6935
Implantable Pulse Generator Implant Date: 20200313
Lead Channel Impedance Value: 285 Ohm
Lead Channel Impedance Value: 304 Ohm
Lead Channel Impedance Value: 342 Ohm
Lead Channel Impedance Value: 361 Ohm
Lead Channel Impedance Value: 456 Ohm
Lead Channel Impedance Value: 703 Ohm
Lead Channel Pacing Threshold Amplitude: 0.625 V
Lead Channel Pacing Threshold Amplitude: 1 V
Lead Channel Pacing Threshold Amplitude: 1.5 V
Lead Channel Pacing Threshold Pulse Width: 0.4 ms
Lead Channel Pacing Threshold Pulse Width: 0.4 ms
Lead Channel Pacing Threshold Pulse Width: 0.4 ms
Lead Channel Sensing Intrinsic Amplitude: 2 mV
Lead Channel Sensing Intrinsic Amplitude: 2 mV
Lead Channel Sensing Intrinsic Amplitude: 7.75 mV
Lead Channel Sensing Intrinsic Amplitude: 7.75 mV
Lead Channel Setting Pacing Amplitude: 1.5 V
Lead Channel Setting Pacing Amplitude: 2 V
Lead Channel Setting Pacing Amplitude: 2.25 V
Lead Channel Setting Pacing Pulse Width: 0.4 ms
Lead Channel Setting Pacing Pulse Width: 0.4 ms
Lead Channel Setting Sensing Sensitivity: 0.3 mV

## 2021-03-19 ENCOUNTER — Ambulatory Visit (INDEPENDENT_AMBULATORY_CARE_PROVIDER_SITE_OTHER): Payer: Medicare HMO

## 2021-03-19 ENCOUNTER — Other Ambulatory Visit: Payer: Self-pay

## 2021-03-19 DIAGNOSIS — I480 Paroxysmal atrial fibrillation: Secondary | ICD-10-CM

## 2021-03-19 DIAGNOSIS — Z5181 Encounter for therapeutic drug level monitoring: Secondary | ICD-10-CM | POA: Diagnosis not present

## 2021-03-19 LAB — POCT INR: INR: 1.3 — AB (ref 2.0–3.0)

## 2021-03-19 NOTE — Patient Instructions (Signed)
-   START NEW dosage of warfarin dosage of 1 tablet (5 mg) every day EXCEPT 2 tablets on MONDAYS, WEDNESDAYS AND FRIDAYS ?- recheck in 1 week ?

## 2021-03-19 NOTE — Progress Notes (Signed)
Remote ICD transmission.   

## 2021-03-26 ENCOUNTER — Ambulatory Visit (INDEPENDENT_AMBULATORY_CARE_PROVIDER_SITE_OTHER): Payer: Medicare HMO

## 2021-03-26 ENCOUNTER — Other Ambulatory Visit: Payer: Self-pay

## 2021-03-26 DIAGNOSIS — I480 Paroxysmal atrial fibrillation: Secondary | ICD-10-CM

## 2021-03-26 DIAGNOSIS — Z5181 Encounter for therapeutic drug level monitoring: Secondary | ICD-10-CM

## 2021-03-26 LAB — POCT INR: INR: 1.7 — AB (ref 2.0–3.0)

## 2021-03-26 NOTE — Patient Instructions (Signed)
-   take 2.5 tablets warfarin tonight, then  ?- continue dosage of warfarin dosage of 1 tablet (5 mg) every day EXCEPT 2 tablets on MONDAYS, WEDNESDAYS AND FRIDAYS ?- recheck in 2 weeks ?DON'T HAVE ANY GREENS FOR THE NEXT DAY OR 2 ?AFTER THAT, GET THEM ON A SCHEDULE (the same day or days each week) ?

## 2021-03-28 ENCOUNTER — Telehealth: Payer: Self-pay | Admitting: Cardiovascular Disease

## 2021-03-28 MED ORDER — WARFARIN SODIUM 5 MG PO TABS: ORAL_TABLET | ORAL | 0 refills | Status: DC

## 2021-03-28 NOTE — Telephone Encounter (Signed)
Prescription refill request received for warfarin ?Lov: 07/30/20 [facility]iment)  ?Next INR check: 04/09/21 ?Warfarin tablet strength: 5mg  ? ? ?Pt overdue to see cardiologist. Has scheduled appt on 04/02/21  with Dr 04/04/21. Appropriate dose and refill for a 30 day supply has been sent to requested pharmacy until after office visit.  ?

## 2021-03-28 NOTE — Telephone Encounter (Signed)
?*  STAT* If patient is at the pharmacy, call can be transferred to refill team. ? ? ?1. Which medications need to be refilled? (please list name of each medication and dose if known) warfarin 5 MG  ? ?2. Which pharmacy/location (including street and city if local pharmacy) is medication to be sent to? [facility] in [place]  ? ?3. Do they need a 30 day or 90 day supply? 90 day  ? ?

## 2021-04-02 ENCOUNTER — Encounter: Payer: Self-pay | Admitting: Nurse Practitioner

## 2021-04-02 ENCOUNTER — Ambulatory Visit (INDEPENDENT_AMBULATORY_CARE_PROVIDER_SITE_OTHER): Payer: Medicare HMO | Admitting: Nurse Practitioner

## 2021-04-02 VITALS — BP 144/72 | HR 64 | Ht 70.0 in | Wt 234.0 lb

## 2021-04-02 DIAGNOSIS — Z794 Long term (current) use of insulin: Secondary | ICD-10-CM

## 2021-04-02 DIAGNOSIS — I480 Paroxysmal atrial fibrillation: Secondary | ICD-10-CM

## 2021-04-02 DIAGNOSIS — I5022 Chronic systolic (congestive) heart failure: Secondary | ICD-10-CM | POA: Diagnosis not present

## 2021-04-02 DIAGNOSIS — I255 Ischemic cardiomyopathy: Secondary | ICD-10-CM | POA: Diagnosis not present

## 2021-04-02 DIAGNOSIS — E785 Hyperlipidemia, unspecified: Secondary | ICD-10-CM

## 2021-04-02 DIAGNOSIS — E119 Type 2 diabetes mellitus without complications: Secondary | ICD-10-CM

## 2021-04-02 DIAGNOSIS — I10 Essential (primary) hypertension: Secondary | ICD-10-CM

## 2021-04-02 DIAGNOSIS — I2510 Atherosclerotic heart disease of native coronary artery without angina pectoris: Secondary | ICD-10-CM | POA: Diagnosis not present

## 2021-04-02 MED ORDER — ENTRESTO 49-51 MG PO TABS: 1.0000 | ORAL_TABLET | Freq: Two times a day (BID) | ORAL | 3 refills | Status: DC

## 2021-04-02 NOTE — Progress Notes (Signed)
? ? ?Office Visit  ?  ?Patient Name: Adam Douglas ?Date of Encounter: 04/02/2021 ? ?Primary Care Provider:  Verita Lamb, NP ?Primary Cardiologist:  Ida Rogue, MD ? ?Chief Complaint  ?  ?71 year old male with a history of CAD, chronic HFimpEF, ischemic cardiomyopathy status post biventricular AICD, diabetes, hypertension, hyperlipidemia, ocular strokes, obesity, and paroxysmal atrial fibrillation, who presents for follow-up of heart failure and CAD. ? ?Past Medical History  ?  ?Past Medical History:  ?Diagnosis Date  ? CAD (coronary artery disease)   ? a. 02/2017 NSTEMI/Cath: LM 40d, LAD 23m, 52m/d, D2 small w/ sev prox/m dzs, LCX nl, OM1 sev diff dzs - small vessel, OM2 80-90p/m, RCA dominant 68m, RPDA 50, EF <20%-->Med Rx.  ? Diabetes mellitus without complication ([place])   ? HFimpEF (heart failure with improved ejection fraction) ([place])   ? a. 02/2017 Echo: EF 20-25%; b. 03/2018 Echo: EF 30-35%, mild conc LVH. Impaired relaxation. Nl RV fxn. Mild BAE. Mild to mod MR. Mild Ao root dil; c. 09/2020 Echo: EF 50%, glob HK, mod LVH. GrI DD. Nl RV size/fxn. Mod dil LA. Mild MR.  ? Hyperlipidemia   ? Hypertension   ? Ischemic cardiomyopathy   ? a. 02/2017 Echo: EF 20-25%; b. 03/2018 Echo: EF 30-35%; c. 03/2018 s/p MDT [id number] [place] MRI CRT-D; d. 09/2020 Echo: EF 50%.  ? LBBB (left bundle branch block)   ? a. Noted 02/2017. Pt denies any known prior history of LBBB (not present on 2014 [facility] ECG interpretation).  ? Morbid obesity ([place])   ? Stroke [facility])   ? a. Ocular strokes x 3 - prev eval in [facility] for first 2, [facility] for last one.  ? ?Past Surgical History:  ?Procedure Laterality Date  ? BIV ICD INSERTION CRT-D N/A 03/18/2018  ? Procedure: BIV ICD INSERTION CRT-D;  Surgeon: Deboraha Sprang, MD;  Location: [place] CV [facility];  Service: Cardiovascular;  Laterality: N/A;  ? CERVICAL DISC SURGERY    ? CHOLECYSTECTOMY    ? LEFT HEART CATH AND CORONARY ANGIOGRAPHY N/A 03/03/2017  ? Procedure: LEFT HEART CATH AND CORONARY  ANGIOGRAPHY;  Surgeon: Minna Merritts, MD;  Location: [place] CV [facility];  Service: Cardiovascular;  Laterality: N/A;  ? ? ?Allergies ? ?No Known Allergies ? ?History of Present Illness  ?  ?71 year old male with the above complex past medical history including CAD, chronic HFimpEF, ischemic cardiomyopathy status post biventricular AICD, diabetes, hypertension, hyperlipidemia, ocular strokes, obesity, and paroxysmal atrial fibrillation.  He suffered a non-STEMI in February 2019, with diagnostic catheterization revealing moderate to severe diffuse, small vessel CAD with an EF of less than 20% by ventriculography.  Echo showed an EF of 20 to 25%.  Medical therapy was recommended given diffuse nature of disease.  Despite guideline directed medical therapy, patient continued to have reduced LV function, with an EF of 30 to 35% by echo in March 2020 and subsequently underwent CRT-D.  He was later diagnosed with paroxysmal atrial fibrillation and has been chronically anticoagulated on warfarin. ? ?Mr. Hatch was last seen in cardiology clinic in July 2022, at which time he reported a sedentary lifestyle with dyspnea on exertion after walking about 15 to 20 feet with an increase in use of as needed Lasix.  I arrange for follow-up echocardiogram which was performed in September 2022, which showed improvement in EF to 50%.  He was due to follow-up in late October and again in mid February however, did not present for those appointments.  He  has been followed in Coumadin clinic and is now due for refill on his warfarin.  Since his last visit, he has done well.  He has noted improvement in activity tolerance, though his wife points out via phone, that he is still relatively sedentary.  He now denies any significant limitations or dyspnea on exertion.  He does not experience chest pain and denies palpitations, PND, orthopnea, dizziness, syncope, or early satiety.  He sometimes notes mild lower extremity swelling after  sitting for long periods.  His wife indicates that he has been out of [facility] for a few months.  He was previously enrolled in the assistance program and needs a repeat application. ? ?Home Medications  ?  ?Current Outpatient Medications  ?Medication Sig Dispense Refill  ? acetaminophen (TYLENOL) 650 MG CR tablet Take 1,300 mg by mouth 2 (two) times daily.    ? aspirin EC 81 MG tablet Take 1 tablet (81 mg total) by mouth daily. 90 tablet 3  ? atorvastatin (LIPITOR) 80 MG tablet Take 80 mg by mouth daily.    ? carvedilol (COREG) 12.5 MG tablet Take 1 tablet by mouth twice daily 180 tablet 0  ? citalopram (CELEXA) 40 MG tablet Take 40 mg by mouth daily.    ? cyclobenzaprine (FLEXERIL) 10 MG tablet Take by mouth.    ? furosemide (LASIX) 40 MG tablet Take one tablet (40mg ) by mouth once daily, you may take one additional tablet (40mg ) once daily as needed for swelling or increase shortness of breath. 135 tablet 3  ? insulin aspart (NOVOLOG) 100 UNIT/ML injection Inject 15 Units into the skin 3 (three) times daily with meals.    ? insulin detemir (LEVEMIR) 100 UNIT/ML injection Inject 20 Units into the skin as needed.    ? metFORMIN (GLUCOPHAGE-XR) 500 MG 24 hr tablet Take 500 mg by mouth 2 (two) times daily.    ? nitroGLYCERIN (NITROSTAT) 0.4 MG SL tablet PLACE 1 TABLET UNDER THE TONGUE EVERY 5 MINUTES AS NEEDED 25 tablet 0  ? omeprazole (PRILOSEC) 20 MG capsule Take 20 mg by mouth 2 (two) times daily before a meal.    ? sacubitril-valsartan (ENTRESTO) 49-51 MG Take 1 tablet by mouth 2 (two) times daily. 180 tablet 3  ? spironolactone (ALDACTONE) 25 MG tablet Take 1/2 (one-half) tablet by mouth once daily 45 tablet 0  ? traMADol (ULTRAM) 50 MG tablet Take 50 mg by mouth every 8 (eight) hours as needed for pain.    ? warfarin (COUMADIN) 5 MG tablet TAKE 1/2 TO 1 (ONE-HALF TO ONE) TABLET BY MOUTH ONCE DAILY AS  DIRECTED  BY  THE  COUMADIN  CLINIC.   Pt is past due for appt with Dr Rockey Situ and INR check. 40 tablet 0  ? ?No  current facility-administered medications for this visit.  ?  ? ?Review of Systems  ?  ?Occasional mild lower extremity edema.  He otherwise denies chest pain, dyspnea, palpitations, PND, orthopnea, dizziness, syncope, or early satiety.  All other systems reviewed and are otherwise negative except as noted above. ?  ? ?Physical Exam  ?  ?VS:  BP (!) 144/72 (BP Location: Left Arm, Patient Position: Sitting, Cuff Size: Normal)   Pulse 64   Ht 5\' 10"  (1.778 m)   Wt 234 lb (106.1 kg)   SpO2 95%   BMI 33.58 kg/m?  , BMI Body mass index is 33.58 kg/m?. ?    ?GEN: Well nourished, well developed, in no acute distress. ?HEENT: normal. ?Neck: Supple,  no JVD, carotid bruits, or masses. ?Cardiac: RRR, no murmurs, rubs, or gallops. No clubbing, cyanosis, trace bilateral ankle edema.  Radials 2+/PT 1+ and equal bilaterally.  ?Respiratory:  Respirations regular and unlabored, clear to auscultation bilaterally. ?GI: Soft, nontender, nondistended, BS + x 4. ?MS: no deformity or atrophy. ?Skin: warm and dry, no rash. ?Neuro:  Strength and sensation are intact. ?Psych: Normal affect. ? ?Accessory Clinical Findings  ?  ?ECG personally reviewed by me today -AV paced, 64- no acute changes. ? ?Labs dated 02/24/2021 from [facility]) ? ?Hgb 12.9, HCT 37.3, WBC 10.7, Plt 219 ?Na 138, potassium 4.1, chloride 103, CO2 26, BUN 19, creatinine 0.97, glucose 173 ?Total protein 7.0, albumin 3.6, calcium 9.2 ?Total bilirubin 0.4, alkaline phosphatase 94, AST 22, ALT 21 ?Hemoglobin A1c 7.5 ? ?Assessment & Plan  ?  ?1.  Chronic heart failure with improved ejection fraction/ischemic cardiomyopathy: Patient with prior history of LV dysfunction with EF previously as low as 20 to 25% with subsequent placement of biventricular defibrillator.  Most recent echo in September 2022 showed improvement in EF to 50% with global hypokinesis and grade 1 diastolic dysfunction.  He has done well over the past 8 months and has noted significant  improvement in activity tolerance.  He has only trace ankle edema, which she notes is not generally present first thing in the morning.  He has been out of his Delene Loll for few months and we will refill today.  We have als

## 2021-04-02 NOTE — Patient Instructions (Addendum)
Medication Instructions:  ?No changes at this time. ? ?Medication Samples have been provided to the patient. ? ?Drug name: Delene Loll         ?Strength: 49/51         ?Qty: 2 bottles   ?LOT: [id number]   ?Exp.Date: 01/2023 ? ? ?*If you need a refill on your cardiac medications before your next appointment, please call your pharmacy* ? ? ?Lab Work: ?None ? ?If you have labs (blood work) drawn today and your tests are completely normal, you will receive your results only by: ?MyChart Message (if you have MyChart) OR ?A paper copy in the mail ?If you have any lab test that is abnormal or we need to change your treatment, we will call you to review the results. ? ? ?Testing/Procedures: ?None ? ? ?Follow-Up: ?At [facility], you and your health needs are our priority.  As part of our continuing mission to provide you with exceptional heart care, we have created designated Provider Care Teams.  These Care Teams include your primary Cardiologist (physician) and Advanced Practice Providers (APPs -  Physician Assistants and Nurse Practitioners) who all work together to provide you with the care you need, when you need it. ? ?Your next appointment:   ?6 month(s) ? ?The format for your next appointment:   ?In Person ? ?Provider:   ?Ida Rogue, MD or Murray Hodgkins, NP ?

## 2021-04-07 ENCOUNTER — Ambulatory Visit (INDEPENDENT_AMBULATORY_CARE_PROVIDER_SITE_OTHER): Payer: Medicare HMO

## 2021-04-07 DIAGNOSIS — Z95810 Presence of automatic (implantable) cardiac defibrillator: Secondary | ICD-10-CM | POA: Diagnosis not present

## 2021-04-07 DIAGNOSIS — I5022 Chronic systolic (congestive) heart failure: Secondary | ICD-10-CM | POA: Diagnosis not present

## 2021-04-09 NOTE — Progress Notes (Signed)
EPIC Encounter for ICM Monitoring ? ?Patient Name: Adam Douglas is a 71 y.o. male ?Date: 04/09/2021 ?Primary Care Physican: Etheleen Nicks, NP ?Primary Cardiologist: Mariah Milling ?Electrophysiologist: Graciela Husbands ?Bi-V Pacing: 97.2%         ?04/02/2021 Office Weight: 234 lbs ?                                                         ?  ?Transmission reviewed.  ?  ?Optivol thoracic impedance suggesting normal fluid levels ?  ?Prescribed:   ?Furosemide 40 mg Take one tablet (40mg ) by mouth once daily, you may take one additional tablet (40mg ) once daily as needed for swelling or increase shortness of breath. ?Spironolactone 25 mg take 0.5 tablet (12.5 mg total) daily ?  ?Labs: ?02/24/2021 Creatinine 0.97, BUN 20, Potassium 4.1, Sodium 138, GFR 83 ?A complete set of results can be found in Results Review. ?  ?Recommendations:  No changes   ?  ?Follow-up plan: ICM clinic phone appointment on 05/12/2021.    91 day device clinic remote transmission 6/90/2023.    ?  ?EP/Cardiology Next Office Visit:    Recall 03/09/2020 with Dr 08-07-1976.  10/03/2021 with Dr Graciela Husbands. ?  ?Copy of ICM check sent to Dr. 10/05/2021.   ?  ?3 month ICM trend: 04/07/2021. ? ? ? ?12-14 Month ICM trend:  ? ? ? ?Graciela Husbands, RN ?04/09/2021 ?1:42 PM ? ?

## 2021-04-14 ENCOUNTER — Emergency Department
Admission: EM | Admit: 2021-04-14 | Discharge: 2021-04-14 | Disposition: A | Discharge status: Home / Self Care | Payer: Medicare HMO | Attending: Student in an Organized Health Care Education/Training Program | Admitting: Student in an Organized Health Care Education/Training Program

## 2021-04-14 ENCOUNTER — Other Ambulatory Visit: Payer: Self-pay

## 2021-04-14 DIAGNOSIS — T783XXA Angioneurotic edema, initial encounter: Secondary | ICD-10-CM | POA: Insufficient documentation

## 2021-04-14 LAB — CBC
HCT: 37 % — ABNORMAL LOW (ref 39.0–52.0)
Hemoglobin: 12.8 g/dL — ABNORMAL LOW (ref 13.0–17.0)
MCH: 30.9 pg (ref 26.0–34.0)
MCHC: 34.6 g/dL (ref 30.0–36.0)
MCV: 89.4 fL (ref 80.0–100.0)
Platelets: 231 10*3/uL (ref 150–400)
RBC: 4.14 MIL/uL — ABNORMAL LOW (ref 4.22–5.81)
RDW: 12.8 % (ref 11.5–15.5)
WBC: 10.7 10*3/uL — ABNORMAL HIGH (ref 4.0–10.5)
nRBC: 0 % (ref 0.0–0.2)

## 2021-04-14 LAB — BASIC METABOLIC PANEL
Anion gap: 9 (ref 5–15)
BUN: 16 mg/dL (ref 8–23)
CO2: 21 mmol/L — ABNORMAL LOW (ref 22–32)
Calcium: 6.8 mg/dL — ABNORMAL LOW (ref 8.9–10.3)
Chloride: 109 mmol/L (ref 98–111)
Creatinine, Ser: 0.88 mg/dL (ref 0.61–1.24)
GFR, Estimated: >60 mL/min (ref >60)
Glucose, Bld: 166 mg/dL — ABNORMAL HIGH (ref 70–99)
Potassium: 2.7 mmol/L — CL (ref 3.5–5.1)
Sodium: 139 mmol/L (ref 135–145)

## 2021-04-14 MED ORDER — AMLODIPINE BESYLATE 5 MG PO TABS: 5.0000 mg | ORAL_TABLET | Freq: Every day | ORAL | 0 refills | Status: DC

## 2021-04-14 MED ORDER — METHYLPREDNISOLONE SODIUM SUCC 125 MG IJ SOLR
125.0000 mg | Freq: Once | INTRAMUSCULAR | Status: AC
  Administered 2021-04-14: 125 mg via INTRAVENOUS
  Filled 2021-04-14: qty 2

## 2021-04-14 MED ORDER — FAMOTIDINE IN NACL 20-0.9 MG/50ML-% IV SOLN
20.0000 mg | Freq: Once | INTRAVENOUS | Status: AC
  Administered 2021-04-14: 20 mg via INTRAVENOUS
  Filled 2021-04-14: qty 50

## 2021-04-14 MED ORDER — EPINEPHRINE 0.3 MG/0.3ML IJ SOAJ
0.3000 mg | Freq: Once | INTRAMUSCULAR | Status: AC
  Administered 2021-04-14: 0.3 mg via INTRAMUSCULAR
  Filled 2021-04-14: qty 0.3

## 2021-04-14 MED ORDER — POTASSIUM CHLORIDE 10 MEQ/100ML IV SOLN
10.0000 meq | INTRAVENOUS | Status: AC
  Administered 2021-04-14 (×2): 10 meq via INTRAVENOUS
  Filled 2021-04-14: qty 100

## 2021-04-14 MED ORDER — POTASSIUM CHLORIDE CRYS ER 20 MEQ PO TBCR
40.0000 meq | EXTENDED_RELEASE_TABLET | Freq: Once | ORAL | Status: AC
  Administered 2021-04-14: 40 meq via ORAL
  Filled 2021-04-14: qty 2

## 2021-04-14 MED ORDER — DIPHENHYDRAMINE HCL 50 MG/ML IJ SOLN
12.5000 mg | Freq: Once | INTRAMUSCULAR | Status: AC
  Administered 2021-04-14: 12.5 mg via INTRAVENOUS
  Filled 2021-04-14: qty 1

## 2021-04-14 NOTE — ED Provider Notes (Signed)
? ?[facility] ?Provider Note ? ? ? Event Date/Time  ? First MD Initiated Contact with Patient 04/14/21 1345   ?  (approximate) ? ? ?History  ? ?Angioedema ? ? ?HPI ? ?Adam Douglas is a 71 y.o. male with extensive cardiac history not on ACE inhibitor presents to the ER for evaluation of swelling tongue is present when he woke up this morning.  States he feels like he does have some itchiness in the back of his throat but able to breathe and speak in normal sentences at this time.  Denies any chest pain or pressure.  Has never had this happen before. ? ? ?Physical Exam  ? ?Triage Vital Signs: ?ED Triage Vitals  ?Enc Vitals Group  ?   BP 04/14/21 1342 (!) 185/87  ?   Pulse Rate 04/14/21 1340 80  ?   Resp 04/14/21 1340 (!) 22  ?   Temp 04/14/21 1341 97.7 ?F (36.5 ?C)  ?   Temp Source 04/14/21 1340 Oral  ?   SpO2 04/14/21 1340 96 %  ?   Weight 04/14/21 1340 234 lb (106.1 kg)  ?   Height 04/14/21 1340 5\' 6"  (1.676 m)  ?   Head Circumference --   ?   Peak Flow --   ?   Pain Score 04/14/21 1340 0  ?   Pain Loc --   ?   Pain Edu? --   ?   Excl. in GC? --   ? ? ?Most recent vital signs: ?Vitals:  ? 04/14/21 1415 04/14/21 1455  ?BP:  (!) 174/75  ?Pulse: 67 71  ?Resp: 12 14  ?Temp:    ?SpO2: 98% 97%  ? ? ? ?Constitutional: Alert  ?Eyes: Conjunctivae are normal.  ?Head: Atraumatic. ?Nose: No congestion/rhinnorhea. ?Mouth/Throat: Mucous membranes are moist.   Angioedema of tongue, no posterior edema ?Neck: Painless ROM.  ?Cardiovascular:   Good peripheral circulation. ?Respiratory: Normal respiratory effort.  No retractions.  ?Gastrointestinal: Soft and nontender.  ?Musculoskeletal:  no deformity ?Neurologic:  MAE spontaneously. No gross focal neurologic deficits are appreciated.  ?Skin:  Skin is warm, dry and intact. No rash noted. ?Psychiatric: Mood and affect are normal. Speech and behavior are normal. ? ? ? ?ED Results / Procedures / Treatments  ? ?Labs ?(all labs ordered are listed, but only abnormal  results are displayed) ?Labs Reviewed  ?CBC - Abnormal; Notable for the following components:  ?    Result Value  ? WBC 10.7 (*)   ? RBC 4.14 (*)   ? Hemoglobin 12.8 (*)   ? HCT 37.0 (*)   ? All other components within normal limits  ?BASIC METABOLIC PANEL - Abnormal; Notable for the following components:  ? Potassium 2.7 (*)   ? CO2 21 (*)   ? Glucose, Bld 166 (*)   ? Calcium 6.8 (*)   ? All other components within normal limits  ? ? ? ?EKG ? ? ? ? ?RADIOLOGY ? ? ? ?PROCEDURES: ? ?Critical Care performed:  ? ?.Critical Care ?Performed by: 06/14/21, MD ?Authorized by: Willy Eddy, MD  ? ?Critical care provider statement:  ?  Critical care time (minutes):  35 ?  Critical care was necessary to treat or prevent imminent or life-threatening deterioration of the following conditions: angioedema. ?  Critical care was time spent personally by me on the following activities:  Ordering and performing treatments and interventions, ordering and review of laboratory studies, ordering and review of radiographic studies, pulse oximetry,  re-evaluation of patient's condition, review of old charts, obtaining history from patient or surrogate, examination of patient, evaluation of patient's response to treatment, discussions with primary provider, discussions with consultants and development of treatment plan with patient or surrogate ? ? ?MEDICATIONS ORDERED IN ED: ?Medications  ?potassium chloride 10 mEq in 100 mL IVPB (10 mEq Intravenous New Bag/Given 04/14/21 1458)  ?potassium chloride SA (KLOR-CON M) CR tablet 40 mEq (has no administration in time range)  ?methylPREDNISolone sodium succinate (SOLU-MEDROL) 125 mg/2 mL injection 125 mg (125 mg Intravenous Given 04/14/21 1403)  ?diphenhydrAMINE (BENADRYL) injection 12.5 mg (12.5 mg Intravenous Given 04/14/21 1403)  ?famotidine (PEPCID) IVPB 20 mg premix (0 mg Intravenous Stopped 04/14/21 1447)  ?EPINEPHrine (EPI-PEN) injection 0.3 mg (0.3 mg Intramuscular Given 04/14/21  1403)  ? ? ? ?IMPRESSION / MDM / ASSESSMENT AND PLAN / ED COURSE  ?I reviewed the triage vital signs and the nursing notes. ?             ?               ? ?Differential diagnosis includes, but is not limited to, angioedema, anaphylaxis, drug reaction, glossitis, epiglottitis, Ludwigs ? ?Patient presenting to the ER with evidence of acute angioedema of the tongue.  Patient is on Entresto likely source of his angioedema.  Patient given epi H1 and H2 and steroids.  He is protecting his airway currently does have moderate angioedema and swelling of the tongue swelling concerning for potential airway compromise but currently protecting his airway he is phonating well.  Feels improved after treatment.  Will place on cardiac monitor will observe. ? ? ?Clinical Course as of 04/14/21 1615  ?Mon Apr 14, 2021  ?1427 Patient reassessed feels like it is continuing to improve but still quite angioedematous tongue.  No stridor.  Will closely monitor. [PR]  ?1505 Patient continues to improve clinically.  Still swelling but feels like it is much less firm.  Patient will be sent to the ER, in physician pending further observation and reassessment. [PR]  ?1615 Patient reassessed.  Continues to feel improved. [PR]  ?  ?Clinical Course User Index ?[PR] Willy Eddy, MD  ? ? ? ?FINAL CLINICAL IMPRESSION(S) / ED DIAGNOSES  ? ?Final diagnoses:  ?Angioedema, initial encounter  ? ? ? ?Rx / DC Orders  ? ?ED Discharge Orders   ? ?      Ordered  ?  amLODipine (NORVASC) 5 MG tablet  Daily       ? 04/14/21 1532  ? ?  ?  ? ?  ? ? ? ?Note:  This document was prepared using Dragon voice recognition software and may include unintentional dictation errors. ? ?  ?Willy Eddy, MD ?04/14/21 1615 ? ?

## 2021-04-14 NOTE — ED Notes (Signed)
Dc instructions and scripts reviewed with pt no questions or concerns at this time. Will follow up with cardiology.  

## 2021-04-14 NOTE — ED Provider Notes (Signed)
Procedures ? ?Clinical Course as of 04/14/21 1842  ?Mon Apr 14, 2021  ?1427 Patient reassessed feels like it is continuing to improve but still quite angioedematous tongue.  No stridor.  Will closely monitor. [PR]  ?1505 Patient continues to improve clinically.  Still swelling but feels like it is much less firm.  Patient will be sent to the ER, in physician pending further observation and reassessment. [PR]  ?1615 Patient reassessed.  Continues to feel improved. [PR]  ?  ?Clinical Course User Index ?[PR] Willy Eddy, MD  ? ? ?----------------------------------------- ?6:42 PM on 04/14/2021 ?----------------------------------------- ? ?Patient reassessed.  Continues improving.  Tolerating oral intake.  Still has some swelling of the tongue but patient states no trouble breathing, no discomfort with swallowing.  Feels comfortable with discharge.  I reinforced to him that he can never take [facility] or other ACE/ARB medicine again.  Dr. Roxan Hockey has prescribed amlodipine for blood pressure coverage.  Patient will follow-up with his doctor for continued monitoring of his symptoms.  Return precautions discussed for any worsening of his swelling or breathing. ? ?  ?Sharman Cheek, MD ?04/14/21 1843 ? ?

## 2021-04-14 NOTE — ED Triage Notes (Signed)
Pt states he woke up with tongue swelling that he reports making it difficult to breath. ?

## 2021-04-23 ENCOUNTER — Ambulatory Visit (INDEPENDENT_AMBULATORY_CARE_PROVIDER_SITE_OTHER): Payer: Medicare HMO

## 2021-04-23 DIAGNOSIS — Z5181 Encounter for therapeutic drug level monitoring: Secondary | ICD-10-CM | POA: Diagnosis not present

## 2021-04-23 DIAGNOSIS — Z8673 Personal history of transient ischemic attack (TIA), and cerebral infarction without residual deficits: Secondary | ICD-10-CM

## 2021-04-23 DIAGNOSIS — I480 Paroxysmal atrial fibrillation: Secondary | ICD-10-CM | POA: Diagnosis not present

## 2021-04-23 LAB — POCT INR: INR: 4.4 — AB (ref 2.0–3.0)

## 2021-04-23 NOTE — Patient Instructions (Signed)
-  HOLD FOR 1 NIGHT ?- continue dosage of warfarin dosage of 1 tablet (5 mg) every day EXCEPT 2 tablets on MONDAYS, WEDNESDAYS AND FRIDAYS ?- recheck in 2 weeks ?

## 2021-05-01 ENCOUNTER — Other Ambulatory Visit: Payer: Self-pay

## 2021-05-01 ENCOUNTER — Emergency Department: Payer: Medicare HMO

## 2021-05-01 ENCOUNTER — Observation Stay
Admission: EM | Admit: 2021-05-01 | Discharge: 2021-05-02 | Disposition: A | Discharge status: Home / Self Care | Payer: Medicare HMO | Attending: Internal Medicine | Admitting: Internal Medicine

## 2021-05-01 DIAGNOSIS — I25118 Atherosclerotic heart disease of native coronary artery with other forms of angina pectoris: Secondary | ICD-10-CM | POA: Diagnosis not present

## 2021-05-01 DIAGNOSIS — I480 Paroxysmal atrial fibrillation: Secondary | ICD-10-CM | POA: Insufficient documentation

## 2021-05-01 DIAGNOSIS — Z8673 Personal history of transient ischemic attack (TIA), and cerebral infarction without residual deficits: Secondary | ICD-10-CM | POA: Diagnosis not present

## 2021-05-01 DIAGNOSIS — I5022 Chronic systolic (congestive) heart failure: Secondary | ICD-10-CM | POA: Diagnosis not present

## 2021-05-01 DIAGNOSIS — J9601 Acute respiratory failure with hypoxia: Secondary | ICD-10-CM | POA: Diagnosis not present

## 2021-05-01 DIAGNOSIS — Z955 Presence of coronary angioplasty implant and graft: Secondary | ICD-10-CM | POA: Insufficient documentation

## 2021-05-01 DIAGNOSIS — E785 Hyperlipidemia, unspecified: Secondary | ICD-10-CM | POA: Insufficient documentation

## 2021-05-01 DIAGNOSIS — Z20822 Contact with and (suspected) exposure to covid-19: Secondary | ICD-10-CM | POA: Insufficient documentation

## 2021-05-01 DIAGNOSIS — Z87891 Personal history of nicotine dependence: Secondary | ICD-10-CM | POA: Insufficient documentation

## 2021-05-01 DIAGNOSIS — Z7982 Long term (current) use of aspirin: Secondary | ICD-10-CM | POA: Insufficient documentation

## 2021-05-01 DIAGNOSIS — Z79899 Other long term (current) drug therapy: Secondary | ICD-10-CM | POA: Diagnosis not present

## 2021-05-01 DIAGNOSIS — I110 Hypertensive heart disease with heart failure: Secondary | ICD-10-CM | POA: Diagnosis not present

## 2021-05-01 DIAGNOSIS — Z7984 Long term (current) use of oral hypoglycemic drugs: Secondary | ICD-10-CM | POA: Diagnosis not present

## 2021-05-01 DIAGNOSIS — J40 Bronchitis, not specified as acute or chronic: Secondary | ICD-10-CM | POA: Diagnosis present

## 2021-05-01 DIAGNOSIS — I10 Essential (primary) hypertension: Secondary | ICD-10-CM | POA: Diagnosis present

## 2021-05-01 DIAGNOSIS — Z7901 Long term (current) use of anticoagulants: Secondary | ICD-10-CM | POA: Insufficient documentation

## 2021-05-01 DIAGNOSIS — I252 Old myocardial infarction: Secondary | ICD-10-CM | POA: Insufficient documentation

## 2021-05-01 DIAGNOSIS — E1169 Type 2 diabetes mellitus with other specified complication: Secondary | ICD-10-CM

## 2021-05-01 DIAGNOSIS — R220 Localized swelling, mass and lump, head: Secondary | ICD-10-CM | POA: Diagnosis present

## 2021-05-01 DIAGNOSIS — T783XXA Angioneurotic edema, initial encounter: Principal | ICD-10-CM | POA: Diagnosis present

## 2021-05-01 DIAGNOSIS — Z95810 Presence of automatic (implantable) cardiac defibrillator: Secondary | ICD-10-CM | POA: Insufficient documentation

## 2021-05-01 DIAGNOSIS — Z794 Long term (current) use of insulin: Secondary | ICD-10-CM | POA: Diagnosis not present

## 2021-05-01 DIAGNOSIS — E119 Type 2 diabetes mellitus without complications: Secondary | ICD-10-CM | POA: Diagnosis not present

## 2021-05-01 LAB — CBC WITH DIFFERENTIAL/PLATELET
Abs Immature Granulocytes: 0.1 10*3/uL — ABNORMAL HIGH (ref 0.00–0.07)
Basophils Absolute: 0.1 10*3/uL (ref 0.0–0.1)
Basophils Relative: 0 %
Eosinophils Absolute: 0.3 10*3/uL (ref 0.0–0.5)
Eosinophils Relative: 1 %
HCT: 36.8 % — ABNORMAL LOW (ref 39.0–52.0)
Hemoglobin: 12.3 g/dL — ABNORMAL LOW (ref 13.0–17.0)
Immature Granulocytes: 1 %
Lymphocytes Relative: 11 %
Lymphs Abs: 2.1 10*3/uL (ref 0.7–4.0)
MCH: 31.1 pg (ref 26.0–34.0)
MCHC: 33.4 g/dL (ref 30.0–36.0)
MCV: 92.9 fL (ref 80.0–100.0)
Monocytes Absolute: 1.6 10*3/uL — ABNORMAL HIGH (ref 0.1–1.0)
Monocytes Relative: 8 %
Neutro Abs: 14.7 10*3/uL — ABNORMAL HIGH (ref 1.7–7.7)
Neutrophils Relative %: 79 %
Platelets: 251 10*3/uL (ref 150–400)
RBC: 3.96 MIL/uL — ABNORMAL LOW (ref 4.22–5.81)
RDW: 13.2 % (ref 11.5–15.5)
WBC: 18.8 10*3/uL — ABNORMAL HIGH (ref 4.0–10.5)
nRBC: 0 % (ref 0.0–0.2)

## 2021-05-01 LAB — BASIC METABOLIC PANEL
Anion gap: 11 (ref 5–15)
BUN: 17 mg/dL (ref 8–23)
CO2: 25 mmol/L (ref 22–32)
Calcium: 8.2 mg/dL — ABNORMAL LOW (ref 8.9–10.3)
Chloride: 100 mmol/L (ref 98–111)
Creatinine, Ser: 1.03 mg/dL (ref 0.61–1.24)
GFR, Estimated: >60 mL/min (ref >60)
Glucose, Bld: 221 mg/dL — ABNORMAL HIGH (ref 70–99)
Potassium: 3.6 mmol/L (ref 3.5–5.1)
Sodium: 136 mmol/L (ref 135–145)

## 2021-05-01 LAB — PROTIME-INR
INR: 2.1 — ABNORMAL HIGH (ref 0.8–1.2)
Prothrombin Time: 23.5 seconds — ABNORMAL HIGH (ref 11.4–15.2)

## 2021-05-01 LAB — CBG MONITORING, ED: Glucose-Capillary: 283 mg/dL — ABNORMAL HIGH (ref 70–99)

## 2021-05-01 LAB — RESP PANEL BY RT-PCR (FLU A&B, COVID) ARPGX2
Influenza A by PCR: NEGATIVE
Influenza B by PCR: NEGATIVE
SARS Coronavirus 2 by RT PCR: NEGATIVE

## 2021-05-01 LAB — D-DIMER, QUANTITATIVE: D-Dimer, Quant: 0.41 ug/mL-FEU (ref 0.00–0.50)

## 2021-05-01 LAB — BRAIN NATRIURETIC PEPTIDE: B Natriuretic Peptide: 139.2 pg/mL — ABNORMAL HIGH (ref 0.0–100.0)

## 2021-05-01 MED ORDER — ALBUTEROL SULFATE (2.5 MG/3ML) 0.083% IN NEBU
2.5000 mg | INHALATION_SOLUTION | Freq: Once | RESPIRATORY_TRACT | Status: AC
Start: 2021-05-01 — End: 2021-05-01
  Administered 2021-05-01: 2.5 mg via RESPIRATORY_TRACT
  Filled 2021-05-01: qty 3

## 2021-05-01 MED ORDER — ALBUTEROL SULFATE (2.5 MG/3ML) 0.083% IN NEBU
2.5000 mg | INHALATION_SOLUTION | Freq: Once | RESPIRATORY_TRACT | Status: AC
  Administered 2021-05-01: 2.5 mg via RESPIRATORY_TRACT
  Filled 2021-05-01: qty 3

## 2021-05-01 MED ORDER — INSULIN DETEMIR 100 UNIT/ML ~~LOC~~ SOLN
20.0000 [IU] | Freq: Every day | SUBCUTANEOUS | Status: DC
  Administered 2021-05-01: 20 [IU] via SUBCUTANEOUS
  Filled 2021-05-01: qty 0.2

## 2021-05-01 MED ORDER — ASPIRIN EC 81 MG PO TBEC
81.0000 mg | DELAYED_RELEASE_TABLET | Freq: Every day | ORAL | Status: DC
  Administered 2021-05-01: 81 mg via ORAL
  Filled 2021-05-01 (×2): qty 1

## 2021-05-01 MED ORDER — DIPHENHYDRAMINE HCL 50 MG/ML IJ SOLN
25.0000 mg | Freq: Once | INTRAMUSCULAR | Status: AC
  Administered 2021-05-01: 25 mg via INTRAVENOUS
  Filled 2021-05-01: qty 1

## 2021-05-01 MED ORDER — SPIRONOLACTONE 25 MG PO TABS
12.5000 mg | ORAL_TABLET | Freq: Every day | ORAL | Status: DC
Start: 2021-05-01 — End: 2021-05-02
  Administered 2021-05-01 – 2021-05-02 (×2): 12.5 mg via ORAL
  Filled 2021-05-01: qty 1
  Filled 2021-05-01: qty 0.5
  Filled 2021-05-01: qty 1
  Filled 2021-05-01: qty 0.5

## 2021-05-01 MED ORDER — FAMOTIDINE IN NACL 20-0.9 MG/50ML-% IV SOLN
20.0000 mg | Freq: Once | INTRAVENOUS | Status: AC
  Administered 2021-05-01: 20 mg via INTRAVENOUS
  Filled 2021-05-01: qty 50

## 2021-05-01 MED ORDER — INSULIN ASPART 100 UNIT/ML IJ SOLN
15.0000 [IU] | Freq: Three times a day (TID) | INTRAMUSCULAR | Status: DC
  Administered 2021-05-02: 15 [IU] via SUBCUTANEOUS
  Filled 2021-05-01: qty 1

## 2021-05-01 MED ORDER — METHYLPREDNISOLONE SODIUM SUCC 125 MG IJ SOLR
125.0000 mg | Freq: Once | INTRAMUSCULAR | Status: AC
  Administered 2021-05-01: 125 mg via INTRAVENOUS
  Filled 2021-05-01: qty 2

## 2021-05-01 MED ORDER — WARFARIN SODIUM 5 MG PO TABS
5.0000 mg | ORAL_TABLET | Freq: Once | ORAL | Status: AC
  Administered 2021-05-01: 5 mg via ORAL
  Filled 2021-05-01: qty 1

## 2021-05-01 MED ORDER — CITALOPRAM HYDROBROMIDE 20 MG PO TABS
40.0000 mg | ORAL_TABLET | Freq: Every day | ORAL | Status: DC
  Administered 2021-05-01 – 2021-05-02 (×2): 40 mg via ORAL
  Filled 2021-05-01 (×2): qty 2

## 2021-05-01 MED ORDER — HYDROCODONE-ACETAMINOPHEN 5-325 MG PO TABS: 1.0000 | ORAL_TABLET | ORAL | Status: DC | PRN

## 2021-05-01 MED ORDER — WARFARIN - PHARMACIST DOSING INPATIENT
Freq: Every day | Status: DC
Start: 2021-05-02 — End: 2021-05-02
  Filled 2021-05-01: qty 1

## 2021-05-01 MED ORDER — ATORVASTATIN CALCIUM 20 MG PO TABS
80.0000 mg | ORAL_TABLET | Freq: Every day | ORAL | Status: DC
  Administered 2021-05-01 – 2021-05-02 (×2): 80 mg via ORAL
  Filled 2021-05-01 (×2): qty 4

## 2021-05-01 MED ORDER — ACETAMINOPHEN 325 MG PO TABS: 650.0000 mg | ORAL_TABLET | Freq: Four times a day (QID) | ORAL | Status: DC | PRN

## 2021-05-01 MED ORDER — EPINEPHRINE 0.3 MG/0.3ML IJ SOAJ
0.3000 mg | Freq: Once | INTRAMUSCULAR | Status: AC
  Administered 2021-05-01: 0.3 mg via INTRAMUSCULAR
  Filled 2021-05-01: qty 0.3

## 2021-05-01 MED ORDER — POTASSIUM IODIDE 1 GM/ML PO SOLN
5.0000 [drp] | Freq: Three times a day (TID) | ORAL | 0 refills | Status: DC
Start: 2021-05-01 — End: 2021-05-02

## 2021-05-01 MED ORDER — AMLODIPINE BESYLATE 5 MG PO TABS
5.0000 mg | ORAL_TABLET | Freq: Every day | ORAL | Status: DC
Start: 2021-05-01 — End: 2021-05-02
  Administered 2021-05-02 (×2): 5 mg via ORAL
  Filled 2021-05-01 (×2): qty 1

## 2021-05-01 MED ORDER — CARVEDILOL 6.25 MG PO TABS
12.5000 mg | ORAL_TABLET | Freq: Two times a day (BID) | ORAL | Status: DC
Start: 2021-05-01 — End: 2021-05-02
  Administered 2021-05-01 – 2021-05-02 (×2): 12.5 mg via ORAL
  Filled 2021-05-01 (×2): qty 2

## 2021-05-01 MED ORDER — MORPHINE SULFATE (PF) 2 MG/ML IV SOLN: 2.0000 mg | INTRAVENOUS | Status: DC | PRN

## 2021-05-01 MED ORDER — METFORMIN HCL ER 500 MG PO TB24
500.0000 mg | ORAL_TABLET | Freq: Two times a day (BID) | ORAL | Status: DC
Start: 2021-05-01 — End: 2021-05-02
  Administered 2021-05-01: 500 mg via ORAL
  Filled 2021-05-01: qty 1

## 2021-05-01 MED ORDER — SODIUM CHLORIDE 0.9 % IV BOLUS
1000.0000 mL | Freq: Once | INTRAVENOUS | Status: AC
  Administered 2021-05-01: 1000 mL via INTRAVENOUS

## 2021-05-01 MED ORDER — NITROGLYCERIN 0.4 MG SL SUBL: 0.4000 mg | SUBLINGUAL_TABLET | SUBLINGUAL | Status: DC | PRN

## 2021-05-01 MED ORDER — ACETAMINOPHEN ER 650 MG PO TBCR: 1300.0000 mg | EXTENDED_RELEASE_TABLET | Freq: Two times a day (BID) | ORAL | Status: DC

## 2021-05-01 MED ORDER — FUROSEMIDE 40 MG PO TABS
40.0000 mg | ORAL_TABLET | Freq: Every day | ORAL | Status: DC
Start: 2021-05-01 — End: 2021-05-02
  Administered 2021-05-01 – 2021-05-02 (×2): 40 mg via ORAL
  Filled 2021-05-01 (×2): qty 1

## 2021-05-01 MED ORDER — SODIUM CHLORIDE 0.9% FLUSH
3.0000 mL | Freq: Two times a day (BID) | INTRAVENOUS | Status: DC
  Administered 2021-05-02 (×2): 3 mL via INTRAVENOUS

## 2021-05-01 MED ORDER — ACETAMINOPHEN 325 MG RE SUPP: 650.0000 mg | Freq: Four times a day (QID) | RECTAL | Status: DC | PRN

## 2021-05-01 MED ORDER — TRAMADOL HCL 50 MG PO TABS
50.0000 mg | ORAL_TABLET | Freq: Three times a day (TID) | ORAL | Status: DC | PRN
Start: 2021-05-01 — End: 2021-05-02
  Administered 2021-05-02: 50 mg via ORAL
  Filled 2021-05-01: qty 1

## 2021-05-01 MED ORDER — PANTOPRAZOLE SODIUM 40 MG PO TBEC
40.0000 mg | DELAYED_RELEASE_TABLET | Freq: Every day | ORAL | Status: DC
  Administered 2021-05-01 – 2021-05-02 (×2): 40 mg via ORAL
  Filled 2021-05-01 (×2): qty 1

## 2021-05-01 NOTE — ED Provider Triage Note (Signed)
Emergency Medicine Provider Triage Evaluation Note ? ?Adam Douglas , a 71 y.o. male  was evaluated in triage.  Pt complains of throat swelling and difficulty breathing, similar symptoms 2 weeks ago.. ? ?Review of Systems  ?Positive: Throat swelling difficulty breathing ?Negative:  ? ?Physical Exam  ?BP (!) 150/83 (BP Location: Left Arm)   Pulse 86   Temp 98.6 ?F (37 ?C) (Oral)   Resp (!) 22   Ht 5\' 6"  (1.676 m)   Wt 106.1 kg   SpO2 (!) 88%   BMI 37.75 kg/m?  ?Gen:   Awake, no distress, patient is breathing with mouth open, lower lip is swollen, throat has swelling noted ?Resp:  Increased respiratory effort ?MSK:   Moves extremities without difficulty  ?Other:   ? ?Medical Decision Making  ?Medically screening exam initiated at 5:11 PM.  Appropriate orders placed.  [name] was informed that the remainder of the evaluation will be completed by another provider, this initial triage assessment does not replace that evaluation, and the importance of remaining in the ED until their evaluation is complete. ? ?Patient with allergic reaction, throat swelling, difficulty breathing, does not wear O2 at home, is on 2 L here because O2 dropped to 88%.  Allergic reaction protocol started.  Will discuss with physician for epinephrine ?  ?[name], PA-C ?05/01/21 1713 ? ?

## 2021-05-01 NOTE — Consult Note (Signed)
Adam Douglas, Adam Douglas ?[id number] ?10/01/1950 ?Dionne Bucy, MD ? ?Reason for Consult: Questionable angioedema ? ?HPI: This is a 71 year old male who on the ninth of last month was seen in the emergency room for significant tongue and floor mouth swelling due to the ACE inhibitor and Entresto he was treated in the emergency room and released and noted today to have some mild difficulty with swallowing.  It has improved somewhat since he has been in the hospital today, has had no shortness of breath no wheezing has had a normal voice able to handle his own secretions.  He is sitting upright in the bed in no distress. ? ?Allergies: No Known Allergies ? ?ROS: Review of systems normal other than 12 systems except per HPI. ? ?PMH:  ?Past Medical History:  ?Diagnosis Date  ? CAD (coronary artery disease)   ? a. 02/2017 NSTEMI/Cath: LM 40d, LAD 90m, 42m/d, D2 small w/ sev prox/m dzs, LCX nl, OM1 sev diff dzs - small vessel, OM2 80-90p/m, RCA dominant 27m, RPDA 50, EF <20%-->Med Rx.  ? Diabetes mellitus without complication (HCC)   ? 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 a. 02/2017 Echo: EF 20-25%; b. 03/2018 Echo: EF 30-35%, mild conc LVH. Impaired relaxation. Nl RV fxn. Mild BAE. Mild to mod MR. Mild Ao root dil; c. 09/2020 Echo: EF 50%, glob HK, mod LVH. GrI DD. Nl RV size/fxn. Mod dil LA. Mild MR.  ? Hyperlipidemia   ? Hypertension   ? Ischemic cardiomyopathy   ? a. 02/2017 Echo: EF 20-25%; b. 03/2018 Echo: EF 30-35%; c. 03/2018 s/p MDT [id number] [facility] MRI CRT-D; d. 09/2020 Echo: EF 50%.  ? LBBB (left bundle branch block)   ? a. Noted 02/2017. Pt denies any known prior history of LBBB (not present on 2014 [facility] ECG interpretation).  ? Morbid obesity (HCC)   ? Stroke [facility])   ? 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  ? ? ?FH:  ?Family History  ?Problem Relation Age of Onset  ? CAD Mother 29  ? Heart attack Mother   ? CAD Father 48  ? Heart attack Father   ? CAD  Sister   ? ? ?SH:  ?Social History  ? ?Socioeconomic History  ? Marital status: Married  ?  Spouse name: Not on file  ? Number of children: Not on file  ? Years of education: Not on file  ? Highest education level: Not on file  ?Occupational History  ? Occupation: Retired  ?  Comment: previously worked in [employer]  ?Tobacco Use  ? Smoking status: Former  ?  Packs/day: 1.00  ?  Years: 15.00  ?  Pack years: 15.00  ?  Types: Cigarettes  ? Smokeless tobacco: Never  ? Tobacco comments:  ?  Quit in his 85's.  ?Vaping Use  ? Vaping Use: Never used  ?Substance and Sexual Activity  ? Alcohol use: No  ? Drug use: No  ? Sexual activity: Not on file  ?Other Topics Concern  ? Not on file  ?Social History Narrative  ? Lives in [place] with his wife.  Children and 15 grandchildren nearby.  He does not routinely exercise.  ? ?Social Determinants of Health  ? ?Financial Resource Strain: Not on file  ?Food Insecurity: Not on file  ?Transportation Needs: Not on file  ?Physical Activity: Not on file  ?Stress: Not on file  ?Social Connections: Not on file  ?  Intimate Partner Violence: Not on file  ? ? ?PSH:  ?Past Surgical History:  ?Procedure Laterality Date  ? BIV ICD INSERTION CRT-D N/A 03/18/2018  ? Procedure: BIV ICD INSERTION CRT-D;  Surgeon: Duke Salvia, MD;  Location: [facility] INVASIVE CV LAB;  Service: Cardiovascular;  Laterality: N/A;  ? CERVICAL DISC SURGERY    ? CHOLECYSTECTOMY    ? LEFT HEART CATH AND CORONARY ANGIOGRAPHY N/A 03/03/2017  ? Procedure: LEFT HEART CATH AND CORONARY ANGIOGRAPHY;  Surgeon: Antonieta Iba, MD;  Location: [facility] INVASIVE CV LAB;  Service: Cardiovascular;  Laterality: N/A;  ? ? ?Physical  Exam: Cranial nerves II through XII grossly intact, anterior nose is widely patent external ears are normal.  Examination oral cavity oropharynx showed a normal tongue normal floor mouth there is no significant swelling of the tongue or floor mouth.  The uvula has mild edema but is not obstructing.  Palpation the  neck was benign. ? ?Procedure: Flexible fiberoptic laryngoscopy-after consent by the patient a topical anesthetic phenylephrine lidocaine was used within each nostril total of 1 and half cc was used.  After 5 minutes a flexible fiberoptic laryngoscope was introduced through the right nostril the nasopharynx was clear.  The uvula had mild edema.  The tongue base epiglottis and larynx were all within normal limits there was no evidence of tongue base swelling or any edema of the larynx. ? ? ?A/P: Mild angioedema of the uvula, this is not obstructing in fact patient thinks may be improving.  Is certainly possible this is leftover from the ACE inhibitor he was taking before, however he does have a history of allergy as well.  Spoken with Dr. Marisa Severin in the emergency room.  Would recommend if admitted IV steroids and p.o. Benadryl.  If he goes home would recommend a 12-day double strength Sterapred taper as well as Benadryl 3 times daily I have given him Mr. Schreur my card to call and make an appointment for follow-up with me next week.  He is currently not taking an ACE inhibitor or an ARB drug and I would highly recommend that he never take these again.  If his symptoms worsen they will let us know. ? ? ?Davina Poke ?05/01/2021 ?6:28 PM ? ? ?  ?

## 2021-05-01 NOTE — H&P (Addendum)
?History and Physical  ? ? ?Patient: Adam Douglas [id number] DOB: 03-19-1950 ?DOA: 05/01/2021 ?DOS: the patient was seen and examined on 05/02/2021 ?PCP: Verita Lamb, NP  ?Patient coming from: Home ? ?Chief Complaint:  ?Chief Complaint  ?Patient presents with  ? Oral Swelling  ? ?HPI: Adam Douglas is a 71 y.o. male with medical history significant of CAD, DM II, HTN , Stroke, PAF on coumadin coming with swelling of tongue and SOB.  ?Pt noted hoarseness and gurgling this morning when he woke up along  with tongue and throat swelling. ?Pt went to his mother in law to [place], just few ago.  ?SOB also on and off for a while. Cardiologist is recommend cardiac rehab and PT.  ?Has had angioedema before and was told to discontinue Entresto and any ARB's or ACEs. ?Patient is not on oxygen at home. ? ?Review of Systems:  ?Review of Systems  ?Respiratory:  Positive for shortness of breath.   ?All other systems reviewed and are negative. ? ?Past Medical History:  ?Diagnosis Date  ? CAD (coronary artery disease)   ? a. 02/2017 NSTEMI/Cath: LM 40d, LAD 55m, 69m/d, D2 small w/ sev prox/m dzs, LCX nl, OM1 sev diff dzs - small vessel, OM2 80-90p/m, RCA dominant 29m, RPDA 50, EF <20%-->Med Rx.  ? Diabetes mellitus without complication ([place])   ? HFimpEF (heart failure with improved ejection fraction) ([place])   ? a. 02/2017 Echo: EF 20-25%; b. 03/2018 Echo: EF 30-35%, mild conc LVH. Impaired relaxation. Nl RV fxn. Mild BAE. Mild to mod MR. Mild Ao root dil; c. 09/2020 Echo: EF 50%, glob HK, mod LVH. GrI DD. Nl RV size/fxn. Mod dil LA. Mild MR.  ? Hyperlipidemia   ? Hypertension   ? Ischemic cardiomyopathy   ? a. 02/2017 Echo: EF 20-25%; b. 03/2018 Echo: EF 30-35%; c. 03/2018 s/p MDT [id number] [facility] CRT-D; d. 09/2020 Echo: EF 50%.  ? LBBB (left bundle branch block)   ? a. Noted 02/2017. Pt denies any known prior history of LBBB (not present on 2014 [facility] ECG interpretation).  ? Morbid obesity ([place])   ? Stroke [facility])   ? a. Ocular  strokes x 3 - prev eval in [facility] for first 2, [facility] for last one.  ? ?Past Surgical History:  ?Procedure Laterality Date  ? BIV ICD INSERTION CRT-D N/A 03/18/2018  ? Procedure: BIV ICD INSERTION CRT-D;  Surgeon: Deboraha Sprang, MD;  Location: [place] CV LAB;  Service: Cardiovascular;  Laterality: N/A;  ? CERVICAL DISC SURGERY    ? CHOLECYSTECTOMY    ? LEFT HEART CATH AND CORONARY ANGIOGRAPHY N/A 03/03/2017  ? Procedure: LEFT HEART CATH AND CORONARY ANGIOGRAPHY;  Surgeon: Minna Merritts, MD;  Location: [place] CV LAB;  Service: Cardiovascular;  Laterality: N/A;  ? ?Social History:  reports that he has quit smoking. He has a 15.00 pack-year smoking history. He has never used smokeless tobacco. He reports that he does not drink alcohol and does not use drugs. ? ?No Known Allergies ? ?Family History  ?Problem Relation Age of Onset  ? CAD Mother 49  ? Heart attack Mother   ? CAD Father 56  ? Heart attack Father   ? CAD Sister   ? ? ?Prior to Admission medications   ?Medication Sig Start Date End Date Taking? Authorizing Provider  ?potassium iodide (SSKI) 1 GM/ML solution Take 0.3 mLs (300 mg total) by mouth 3 (three) times daily. 05/01/21  Yes Arta Silence, MD  ?  acetaminophen (TYLENOL) 650 MG CR tablet Take 1,300 mg by mouth 2 (two) times daily.    [provider]  ?amLODipine (NORVASC) 5 MG tablet Take 1 tablet (5 mg total) by mouth daily. 04/14/21 04/14/22  Merlyn Lot, MD  ?aspirin EC 81 MG tablet Take 1 tablet (81 mg total) by mouth daily. 03/09/17   Theora Gianotti, NP  ?atorvastatin (LIPITOR) 80 MG tablet Take 80 mg by mouth daily. 02/14/19   [provider]  ?carvedilol (COREG) 12.5 MG tablet Take 1 tablet by mouth twice daily 03/04/21   Minna Merritts, MD  ?citalopram (CELEXA) 40 MG tablet Take 40 mg by mouth daily.    [provider]  ?cyclobenzaprine (FLEXERIL) 10 MG tablet Take by mouth. 01/19/20   [provider]  ?furosemide (LASIX) 40 MG  tablet Take one tablet (40mg ) by mouth once daily, you may take one additional tablet (40mg ) once daily as needed for swelling or increase shortness of breath. 11/20/19   Minna Merritts, MD  ?insulin aspart (NOVOLOG) 100 UNIT/ML injection Inject 15 Units into the skin 3 (three) times daily with meals.    [provider]  ?insulin detemir (LEVEMIR) 100 UNIT/ML injection Inject 20 Units into the skin as needed.    [provider]  ?metFORMIN (GLUCOPHAGE-XR) 500 MG 24 hr tablet Take 500 mg by mouth 2 (two) times daily.    [provider]  ?nitroGLYCERIN (NITROSTAT) 0.4 MG SL tablet PLACE 1 TABLET UNDER THE TONGUE EVERY 5 MINUTES AS NEEDED 07/13/19   Deboraha Sprang, MD  ?omeprazole (PRILOSEC) 20 MG capsule Take 20 mg by mouth 2 (two) times daily before a meal.    [provider]  ?spironolactone (ALDACTONE) 25 MG tablet Take 1/2 (one-half) tablet by mouth once daily 03/04/21   Minna Merritts, MD  ?traMADol (ULTRAM) 50 MG tablet Take 50 mg by mouth every 8 (eight) hours as needed for pain.    [provider]  ?warfarin (COUMADIN) 5 MG tablet TAKE 1/2 TO 1 (ONE-HALF TO ONE) TABLET BY MOUTH ONCE DAILY AS  DIRECTED  BY  THE  COUMADIN  CLINIC.   Pt is past due for appt with Dr Rockey Situ and INR check. 03/28/21   Theora Gianotti, NP  ? ? ?Physical Exam: ?Vitals:  ? 05/01/21 2023 05/01/21 2252 05/01/21 2300 05/02/21 0106  ?BP:  (!) 154/64 (!) 146/69 (!) 143/73  ?Pulse: 80 93 92 83  ?Resp: (!) 22  11 (!) 21  ?Temp:      ?TempSrc:      ?SpO2: 93%  92% 92%  ?Weight:      ?Height:      ?Physical Exam ?Vitals and nursing note reviewed.  ?Constitutional:   ?   General: He is not in acute distress. ?   Appearance: Normal appearance. He is not ill-appearing, toxic-appearing or diaphoretic.  ?HENT:  ?   Head: Normocephalic and atraumatic.  ?   Right Ear: Hearing and external ear normal.  ?   Left Ear: Hearing and external ear normal.  ?   Nose: Nose normal. No nasal deformity.  ?    Mouth/Throat:  ?   Lips: Pink.  ?   Mouth: Mucous membranes are moist.  ?   Tongue: No lesions.  ?   Pharynx: Oropharynx is clear.  ?Eyes:  ?   Extraocular Movements: Extraocular movements intact.  ?   Pupils: Pupils are equal, round, and reactive to light.  ?Neck:  ?  Vascular: No carotid bruit.  ?Cardiovascular:  ?   Rate and Rhythm: Normal rate and regular rhythm.  ?   Pulses: Normal pulses.  ?   Heart sounds: Normal heart sounds.  ?Pulmonary:  ?   Effort: Pulmonary effort is normal.  ?   Breath sounds: Wheezing present.  ?Abdominal:  ?   General: Bowel sounds are normal. There is no distension.  ?   Palpations: Abdomen is soft. There is no mass.  ?   Tenderness: There is no abdominal tenderness. There is no guarding.  ?   Hernia: No hernia is present.  ?Musculoskeletal:  ?   Right lower leg: No edema.  ?   Left lower leg: No edema.  ?Skin: ?   General: Skin is warm.  ?Neurological:  ?   General: No focal deficit present.  ?   Mental Status: He is alert and oriented to person, place, and time.  ?   Cranial Nerves: Cranial nerves 2-12 are intact.  ?   Motor: Motor function is intact.  ?Psychiatric:     ?   Attention and Perception: Attention normal.     ?   Mood and Affect: Mood normal.     ?   Speech: Speech normal.     ?   Behavior: Behavior normal. Behavior is cooperative.     ?   Cognition and Memory: Cognition normal.  ? ? ?Data Reviewed: ?Results for orders placed or performed during the hospital encounter of 05/01/21 (from the past 24 hour(s))  ?Basic metabolic panel     Status: Abnormal  ? Collection Time: 05/01/21  7:17 PM  ?Result Value Ref Range  ? Sodium 136 135 - 145 mmol/L  ? Potassium 3.6 3.5 - 5.1 mmol/L  ? Chloride 100 98 - 111 mmol/L  ? CO2 25 22 - 32 mmol/L  ? Glucose, Bld 221 (H) 70 - 99 mg/dL  ? BUN 17 8 - 23 mg/dL  ? Creatinine, Ser 1.03 0.61 - 1.24 mg/dL  ? Calcium 8.2 (L) 8.9 - 10.3 mg/dL  ? GFR, Estimated >60 >60 mL/min  ? Anion gap 11 5 - 15  ?CBC with Differential     Status: Abnormal   ? Collection Time: 05/01/21  7:17 PM  ?Result Value Ref Range  ? WBC 18.8 (H) 4.0 - 10.5 K/uL  ? RBC 3.96 (L) 4.22 - 5.81 MIL/uL  ? Hemoglobin 12.3 (L) 13.0 - 17.0 g/dL  ? HCT 36.8 (L) 39.0 - 52.0 %

## 2021-05-01 NOTE — ED Notes (Signed)
Patient transported to X-ray 

## 2021-05-01 NOTE — ED Triage Notes (Signed)
Pt here with audible SOB and possible throat swelling. Pt sats at 88% on RA. ?

## 2021-05-01 NOTE — Consult Note (Signed)
ANTICOAGULATION CONSULT NOTE - Initial Consult ? ?Pharmacy Consult for warfarin ?Indication: atrial fibrillation ? ?No Known Allergies ? ?Patient Measurements: ?Height: 5\' 6"  (167.6 cm) ?Weight: 106.1 kg (233 lb 14.5 oz) ?IBW/kg (Calculated) : 63.8 ? ?Vital Signs: ?Temp: 98.6 ?F (37 ?C) (04/27 1709) ?Temp Source: Oral (04/27 1709) ?BP: 164/69 (04/27 1900) ?Pulse Rate: 80 (04/27 2023) ? ?Labs: ?Recent Labs  ?  05/01/21 ?1917  ?HGB 12.3*  ?HCT 36.8*  ?PLT 251  ?CREATININE 1.03  ? ? ?Estimated Creatinine Clearance: 75.1 mL/min (by C-G formula based on SCr of 1.03 mg/dL). ? ? ?Medical History: ?Past Medical History:  ?Diagnosis Date  ? CAD (coronary artery disease)   ? a. 02/2017 NSTEMI/Cath: LM 40d, LAD 39m, 37m/d, D2 small w/ sev prox/m dzs, LCX nl, OM1 sev diff dzs - small vessel, OM2 80-90p/m, RCA dominant 90m, RPDA 50, EF <20%-->Med Rx.  ? Diabetes mellitus without complication (HCC)   ? 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 a. 02/2017 Echo: EF 20-25%; b. 03/2018 Echo: EF 30-35%, mild conc LVH. Impaired relaxation. Nl RV fxn. Mild BAE. Mild to mod MR. Mild Ao root dil; c. 09/2020 Echo: EF 50%, glob HK, mod LVH. GrI DD. Nl RV size/fxn. Mod dil LA. Mild MR.  ? Hyperlipidemia   ? Hypertension   ? Ischemic cardiomyopathy   ? a. 02/2017 Echo: EF 20-25%; b. 03/2018 Echo: EF 30-35%; c. 03/2018 s/p MDT 04/2018 [facility] MRI CRT-D; d. 09/2020 Echo: EF 50%.  ? LBBB (left bundle branch block)   ? a. Noted 02/2017. Pt denies any known prior history of LBBB (not present on 2014 [facility] ECG interpretation).  ? Morbid obesity (HCC)   ? Stroke [facility])   ? 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  ? ? ?Medications:  ?Warfarin PTA regimen: source: outpatient anticoagulation clinic note ?10 mg (5 mg x 2) every Mon, Wed, Fri; 5 mg (5 mg x 1) all other days ?TWD = 50 mg ? ?Assessment: ?71 y.o. male with medical history significant of PAF on coumadin regimen above, presented to [facility]  05/01/21 with swelling on tongue and SOB. Pharmacy consulted to manage warfarin regimen while patient admitted. ?INR 2.1 (therapeutic) on admission ? ?Date INR Warfarin Dose ?4/26 -- 10 mg PTA ?4/27 2.1 5 mg  ? ?Goal of Therapy:  ?INR 2-3 ?Monitor platelets by anticoagulation protocol: Yes ?  ?Plan:  ?INR 2.1 (therapeutic) on admission ?Will continue with home regimen of warfarin 5 mg x 1 tonight ?Per patient -- did not take any medications this morning before arrival to ER ?INR  daily ? ? ?5/27, PharmD, BCPS ?Clinical Pharmacist   ?05/01/2021,8:32 PM ? ? ?

## 2021-05-01 NOTE — ED Provider Notes (Signed)
? ?[facility] ?Provider Note ? ? ? Event Date/Time  ? First MD Initiated Contact with Patient 05/01/21 1721   ?  (approximate) ? ? ?History  ? ?Oral Swelling ? ? ?HPI ? ?Adam Douglas is a 71 y.o. male with a history of CAD, diabetes, heart failure, hypertension, and stroke who presents with tongue and throat swelling, gradual onset yesterday, worsened today.  The patient states that is similar to an episode that happened a few weeks ago, but now worse in the back of his throat rather than just his tongue.  He reports associated shortness of breath but no cough or fever.  He has been able to swallow.  He has some difficulty speaking clearly.  He states that previously the angioedema was thought to be due to [facility] but he discontinued this after the episode a few weeks ago.  He has not started any new medications.  He denies any specific allergic exposures.  He has no hives or rash. ? ? ? ?Physical Exam  ? ?Triage Vital Signs: ?ED Triage Vitals [05/01/21 1709]  ?Enc Vitals Group  ?   BP (!) 150/83  ?   Pulse Rate 86  ?   Resp (!) 22  ?   Temp 98.6 ?F (37 ?C)  ?   Temp Source Oral  ?   SpO2 (!) 88 %  ?   Weight 233 lb 14.5 oz (106.1 kg)  ?   Height 5\' 6"  (1.676 m)  ?   Head Circumference   ?   Peak Flow   ?   Pain Score 0  ?   Pain Loc   ?   Pain Edu?   ?   Excl. in GC?   ? ? ?Most recent vital signs: ?Vitals:  ? 05/01/21 2000 05/01/21 2023  ?BP: (!) 155/59   ?Pulse: 80 80  ?Resp: 18 (!) 22  ?Temp:    ?SpO2: 93% 93%  ? ? ? ?General: Awake, no distress.  ?CV:  Good peripheral perfusion.  ?Resp:  Normal effort.  Lungs CTA B. ?Abd:  No distention.  ?Other:  Tongue moderately swollen.  Uvula slightly swollen.  No stridor or pooled secretions.  Slightly dysarthric speech due to swollen tongue. ? ? ?ED Results / Procedures / Treatments  ? ?Labs ?(all labs ordered are listed, but only abnormal results are displayed) ?Labs Reviewed  ?BASIC METABOLIC PANEL - Abnormal; Notable for the following  components:  ?    Result Value  ? Glucose, Bld 221 (*)   ? Calcium 8.2 (*)   ? All other components within normal limits  ?CBC WITH DIFFERENTIAL/PLATELET - Abnormal; Notable for the following components:  ? WBC 18.8 (*)   ? RBC 3.96 (*)   ? Hemoglobin 12.3 (*)   ? HCT 36.8 (*)   ? Neutro Abs 14.7 (*)   ? Monocytes Absolute 1.6 (*)   ? Abs Immature Granulocytes 0.10 (*)   ? All other components within normal limits  ?PROTIME-INR - Abnormal; Notable for the following components:  ? Prothrombin Time 23.5 (*)   ? INR 2.1 (*)   ? All other components within normal limits  ?BRAIN NATRIURETIC PEPTIDE - Abnormal; Notable for the following components:  ? B Natriuretic Peptide 139.2 (*)   ? All other components within normal limits  ?CBG MONITORING, ED - Abnormal; Notable for the following components:  ? Glucose-Capillary 283 (*)   ? All other components within normal limits  ?RESP PANEL BY  RT-PCR (FLU A&B, COVID) ARPGX2  ?D-DIMER, QUANTITATIVE  ?CBC  ?PROTIME-INR  ?COMPREHENSIVE METABOLIC PANEL  ? ? ? ?EKG ? ? ? ? ?RADIOLOGY ? ?Chest x-ray: I independently viewed and interpreted the images; there is some airway prominence with no focal consolidation or edema ? ?PROCEDURES: ? ?Critical Care performed: Yes ? ?.Critical Care ?Performed by: Dionne Bucy, MD ?Authorized by: Dionne Bucy, MD  ? ?Critical care provider statement:  ?  Critical care time (minutes):  30 ?  Critical care was necessary to treat or prevent imminent or life-threatening deterioration of the following conditions:  Respiratory failure ?  Critical care was time spent personally by me on the following activities:  Development of treatment plan with patient or surrogate, discussions with consultants, evaluation of patient's response to treatment, examination of patient, re-evaluation of patient's condition, pulse oximetry, ordering and review of radiographic studies and ordering and performing treatments and interventions ?  Care discussed with:  admitting provider   ? ? ?MEDICATIONS ORDERED IN ED: ?Medications  ?metFORMIN (GLUCOPHAGE-XR) 24 hr tablet 500 mg (has no administration in time range)  ?citalopram (CELEXA) tablet 40 mg (has no administration in time range)  ?insulin aspart (novoLOG) injection 15 Units (has no administration in time range)  ?traMADol (ULTRAM) tablet 50 mg (has no administration in time range)  ?aspirin EC tablet 81 mg (has no administration in time range)  ?insulin detemir (LEVEMIR) injection 20 Units (has no administration in time range)  ?atorvastatin (LIPITOR) tablet 80 mg (has no administration in time range)  ?nitroGLYCERIN (NITROSTAT) SL tablet 0.4 mg (has no administration in time range)  ?furosemide (LASIX) tablet 40 mg (has no administration in time range)  ?spironolactone (ALDACTONE) tablet 12.5 mg (has no administration in time range)  ?carvedilol (COREG) tablet 12.5 mg (has no administration in time range)  ?amLODipine (NORVASC) tablet 5 mg (has no administration in time range)  ?pantoprazole (PROTONIX) EC tablet 40 mg (has no administration in time range)  ?sodium chloride flush (NS) 0.9 % injection 3 mL (has no administration in time range)  ?acetaminophen (TYLENOL) tablet 650 mg (has no administration in time range)  ?  Or  ?acetaminophen (TYLENOL) suppository 650 mg (has no administration in time range)  ?HYDROcodone-acetaminophen (NORCO/VICODIN) 5-325 MG per tablet 1 tablet (has no administration in time range)  ?morphine (PF) 2 MG/ML injection 2 mg (has no administration in time range)  ?warfarin (COUMADIN) tablet 5 mg (has no administration in time range)  ?Warfarin - Pharmacist Dosing Inpatient (has no administration in time range)  ?sodium chloride 0.9 % bolus 1,000 mL (0 mLs Intravenous Stopped 05/01/21 1919)  ?diphenhydrAMINE (BENADRYL) injection 25 mg (25 mg Intravenous Given 05/01/21 1725)  ?methylPREDNISolone sodium succinate (SOLU-MEDROL) 125 mg/2 mL injection 125 mg (125 mg Intravenous Given 05/01/21 1726)   ?famotidine (PEPCID) IVPB 20 mg premix (0 mg Intravenous Stopped 05/01/21 1756)  ?EPINEPHrine (EPI-PEN) injection 0.3 mg (0.3 mg Intramuscular Given 05/01/21 1723)  ?albuterol (PROVENTIL) (2.5 MG/3ML) 0.083% nebulizer solution 2.5 mg (2.5 mg Nebulization Given 05/01/21 2122)  ?albuterol (PROVENTIL) (2.5 MG/3ML) 0.083% nebulizer solution 2.5 mg (2.5 mg Nebulization Given 05/01/21 2122)  ? ? ? ?IMPRESSION / MDM / ASSESSMENT AND PLAN / ED COURSE  ?I reviewed the triage vital signs and the nursing notes. ? ?71 year old male with PMH as noted above presents with tongue and uvular swelling due to unknown precipitant.  The patient was previously on Entresto but this was discontinued. ? ?I reviewed the past medical records.  The patient presented to  the ED on 4/10 with a similar presentation although he states that involve the tongue more than the throat.  He was treated with medication and the angioedema resolved.  He ultimately was discharged home.  I also reviewed his cardiology notes including the most recent visit from 3/29.  He has atrial fibrillation, heart failure, and CAD. ? ?On exam the patient has moderate swelling to the tongue as well as some swelling to the uvula.  There is no stridor or pooled secretions although his voice is somewhat dysarthric due to the swelling of the tongue. ? ?Overall presentation is consistent with angioedema although the etiology is unclear.  The patient does not appear to be having a generalized anaphylactic reaction.  We have given an EpiPen and will give Solu-Medrol, Benadryl, and Pepcid.  At this time, there is no evidence of imminent airway compromise and it appears that he responded well to these medications on the last visit.  I will continue to monitor him closely and reassess every few minutes until there is improvement.  If he does not demonstrate improvement or worsens, he will need ENT evaluation and consideration for possible airway management. ? ?The patient is on the  cardiac monitor to evaluate for evidence of arrhythmia and/or significant heart rate changes. ? ?----------------------------------------- ?5:59 PM on 05/01/2021 ?----------------------------------------- ? ?The p

## 2021-05-02 DIAGNOSIS — J40 Bronchitis, not specified as acute or chronic: Secondary | ICD-10-CM | POA: Diagnosis present

## 2021-05-02 DIAGNOSIS — I5022 Chronic systolic (congestive) heart failure: Secondary | ICD-10-CM

## 2021-05-02 DIAGNOSIS — E785 Hyperlipidemia, unspecified: Secondary | ICD-10-CM

## 2021-05-02 DIAGNOSIS — E1169 Type 2 diabetes mellitus with other specified complication: Secondary | ICD-10-CM | POA: Diagnosis not present

## 2021-05-02 DIAGNOSIS — J9601 Acute respiratory failure with hypoxia: Secondary | ICD-10-CM | POA: Diagnosis not present

## 2021-05-02 DIAGNOSIS — I10 Essential (primary) hypertension: Secondary | ICD-10-CM

## 2021-05-02 DIAGNOSIS — Z8673 Personal history of transient ischemic attack (TIA), and cerebral infarction without residual deficits: Secondary | ICD-10-CM

## 2021-05-02 DIAGNOSIS — T783XXA Angioneurotic edema, initial encounter: Secondary | ICD-10-CM | POA: Diagnosis not present

## 2021-05-02 LAB — COMPREHENSIVE METABOLIC PANEL
ALT: 22 U/L (ref 0–44)
AST: 27 U/L (ref 15–41)
Albumin: 3.3 g/dL — ABNORMAL LOW (ref 3.5–5.0)
Alkaline Phosphatase: 82 U/L (ref 38–126)
Anion gap: 11 (ref 5–15)
BUN: 24 mg/dL — ABNORMAL HIGH (ref 8–23)
CO2: 21 mmol/L — ABNORMAL LOW (ref 22–32)
Calcium: 8.2 mg/dL — ABNORMAL LOW (ref 8.9–10.3)
Chloride: 101 mmol/L (ref 98–111)
Creatinine, Ser: 1.32 mg/dL — ABNORMAL HIGH (ref 0.61–1.24)
GFR, Estimated: 58 mL/min — ABNORMAL LOW (ref >60)
Glucose, Bld: 359 mg/dL — ABNORMAL HIGH (ref 70–99)
Potassium: 3.9 mmol/L (ref 3.5–5.1)
Sodium: 133 mmol/L — ABNORMAL LOW (ref 135–145)
Total Bilirubin: 0.6 mg/dL (ref 0.3–1.2)
Total Protein: 7.1 g/dL (ref 6.5–8.1)

## 2021-05-02 LAB — CBC
HCT: 35.6 % — ABNORMAL LOW (ref 39.0–52.0)
Hemoglobin: 12 g/dL — ABNORMAL LOW (ref 13.0–17.0)
MCH: 31.4 pg (ref 26.0–34.0)
MCHC: 33.7 g/dL (ref 30.0–36.0)
MCV: 93.2 fL (ref 80.0–100.0)
Platelets: 238 10*3/uL (ref 150–400)
RBC: 3.82 MIL/uL — ABNORMAL LOW (ref 4.22–5.81)
RDW: 13 % (ref 11.5–15.5)
WBC: 14.4 10*3/uL — ABNORMAL HIGH (ref 4.0–10.5)
nRBC: 0 % (ref 0.0–0.2)

## 2021-05-02 LAB — HEMOGLOBIN A1C
Hgb A1c MFr Bld: 6.6 % — ABNORMAL HIGH (ref 4.8–5.6)
Mean Plasma Glucose: 142.72 mg/dL

## 2021-05-02 LAB — PROTIME-INR
INR: 2.2 — ABNORMAL HIGH (ref 0.8–1.2)
Prothrombin Time: 24 seconds — ABNORMAL HIGH (ref 11.4–15.2)

## 2021-05-02 LAB — CBG MONITORING, ED: Glucose-Capillary: 337 mg/dL — ABNORMAL HIGH (ref 70–99)

## 2021-05-02 MED ORDER — EPINEPHRINE 0.3 MG/0.3ML IJ SOAJ: 0.3000 mg | INTRAMUSCULAR | 0 refills | Status: DC | PRN

## 2021-05-02 MED ORDER — BUDESONIDE 0.25 MG/2ML IN SUSP
0.2500 mg | Freq: Two times a day (BID) | RESPIRATORY_TRACT | Status: DC
  Administered 2021-05-02 (×2): 0.25 mg via RESPIRATORY_TRACT
  Filled 2021-05-02 (×2): qty 2

## 2021-05-02 MED ORDER — SODIUM CHLORIDE 0.9 % IV SOLN
500.0000 mg | INTRAVENOUS | Status: DC
  Administered 2021-05-02: 500 mg via INTRAVENOUS
  Filled 2021-05-02: qty 5

## 2021-05-02 MED ORDER — METHYLPREDNISOLONE SODIUM SUCC 40 MG IJ SOLR
40.0000 mg | Freq: Once | INTRAMUSCULAR | Status: AC
  Administered 2021-05-02: 40 mg via INTRAVENOUS
  Filled 2021-05-02: qty 1

## 2021-05-02 MED ORDER — INSULIN DETEMIR 100 UNIT/ML ~~LOC~~ SOLN: 50.0000 [IU] | Freq: Two times a day (BID) | SUBCUTANEOUS | 11 refills | Status: DC

## 2021-05-02 MED ORDER — PREDNISONE 10 MG PO TABS: ORAL_TABLET | ORAL | 0 refills | Status: DC

## 2021-05-02 MED ORDER — INSULIN DETEMIR 100 UNIT/ML ~~LOC~~ SOLN
50.0000 [IU] | Freq: Two times a day (BID) | SUBCUTANEOUS | Status: DC
  Filled 2021-05-02: qty 0.5

## 2021-05-02 NOTE — Assessment & Plan Note (Addendum)
Patient takes 50 Levemir units twice a day.  Patient also takes short acting insulin prior to meals.  Sugars may be higher on steroid taper. ?

## 2021-05-02 NOTE — Assessment & Plan Note (Deleted)
Pt started on z pak and pulmicort inhaled.  ?

## 2021-05-02 NOTE — Assessment & Plan Note (Addendum)
Patient had a pulse ox of 88% in the emergency room.  Upon discharge pulse ox 98%.  This problem has resolved. ?

## 2021-05-02 NOTE — Discharge Instructions (Signed)
Stop entresto 

## 2021-05-02 NOTE — Assessment & Plan Note (Addendum)
Currently no signs of heart failure.  Continue Lasix and Coreg.  Last EF back in 2022 showed an EF of 50% ?

## 2021-05-02 NOTE — Discharge Summary (Signed)
?Physician Discharge Summary ?  ?Patient: Adam Douglas MRN: [id number] DOB: May 19, 1950  ?Admit date:     05/01/2021  ?Discharge date: 05/02/21  ?Discharge Physician: Alford Highland  ? ?PCP: Etheleen Nicks, NP  ? ?Recommendations at discharge:  ? ?Follow-up PCP 5 days ?Keep appointment with Dr. Mariah Milling ? ?Discharge Diagnoses: ?Principal Problem: ?  Angioedema of tongue ?Active Problems: ?  Acute respiratory failure with hypoxia (HCC) ?  Essential hypertension ?  Type 2 diabetes mellitus with hyperlipidemia (HCC) ?  Chronic systolic heart failure (HCC) ?  History of stroke ? ? ? ?Hospital Course: ?The patient came into the emergency room for angioedema and discharged home on 04/14/2021.  Sherryll Burger was stopped.  He ended up coming back to the hospital on 05/01/2021.  He was observed overnight and felt much better and was discharged home on 05/02/2021.  He was seen by ENT.  Prednisone taper prescribed on discharge.  Can refer to allergist as outpatient.  I recommend staying off Entresto, ACE inhibitors or ARB's at this point in time. ? ?Assessment and Plan: ?* Angioedema of tongue ?Improved on the day of discharge.  We will give a prednisone taper.  Continue to stay off Entresto. ? ?Acute respiratory failure with hypoxia (HCC) ?Patient had a pulse ox of 88% in the emergency room.  Upon discharge pulse ox 98%.  This problem has resolved. ? ?Essential hypertension ?Continue Norvasc, Coreg, Lasix, spironolactone ? ?Type 2 diabetes mellitus with hyperlipidemia (HCC) ?Patient takes 50 Levemir units twice a day.  Patient also takes short acting insulin prior to meals.  Sugars may be higher on steroid taper. ? ?Chronic systolic heart failure (HCC) ?Currently no signs of heart failure.  Continue Lasix and Coreg.  Last EF back in 2022 showed an EF of 50% ? ?History of stroke ?On Coumadin ? ? ? ? ?  ? ? ?Consultants: ENT ?Procedures performed: Flexible fiberoptic laryngoscopic ?Disposition: Home ?Diet recommendation:  ?Cardiac and  Carb modified diet ?DISCHARGE MEDICATION: ?Allergies as of 05/02/2021   ?No Known Allergies ?  ? ?  ?Medication List  ?  ? ?TAKE these medications   ? ?acetaminophen 650 MG CR tablet ?Commonly known as: TYLENOL ?Take 1,300 mg by mouth 2 (two) times daily. ?  ?amLODipine 5 MG tablet ?Commonly known as: NORVASC ?Take 1 tablet (5 mg total) by mouth daily. ?  ?aspirin EC 81 MG tablet ?Take 1 tablet (81 mg total) by mouth daily. ?  ?atorvastatin 80 MG tablet ?Commonly known as: LIPITOR ?Take 80 mg by mouth daily. ?  ?carvedilol 12.5 MG tablet ?Commonly known as: COREG ?Take 1 tablet by mouth twice daily ?  ?citalopram 40 MG tablet ?Commonly known as: CELEXA ?Take 40 mg by mouth daily. ?  ?cyclobenzaprine 10 MG tablet ?Commonly known as: FLEXERIL ?Take 10 mg by mouth 3 (three) times daily as needed for muscle spasms. ?  ?EPINEPHrine 0.3 mg/0.3 mL Soaj injection ?Commonly known as: EPI-PEN ?Inject 0.3 mg into the muscle as needed for anaphylaxis. ?  ?furosemide 40 MG tablet ?Commonly known as: LASIX ?Take one tablet (40mg ) by mouth once daily, you may take one additional tablet (40mg ) once daily as needed for swelling or increase shortness of breath. ?  ?insulin aspart 100 UNIT/ML injection ?Commonly known as: novoLOG ?Inject 15 Units into the skin 3 (three) times daily with meals. ?  ?insulin detemir 100 UNIT/ML injection ?Commonly known as: LEVEMIR ?Inject 50 Units into the skin 2 (two) times daily. ?What changed: Another medication with the same  name was removed. Continue taking this medication, and follow the directions you see here. ?  ?metFORMIN 500 MG 24 hr tablet ?Commonly known as: GLUCOPHAGE-XR ?Take 500 mg by mouth 2 (two) times daily. ?  ?nitroGLYCERIN 0.4 MG SL tablet ?Commonly known as: NITROSTAT ?PLACE 1 TABLET UNDER THE TONGUE EVERY 5 MINUTES AS NEEDED ?  ?omeprazole 20 MG capsule ?Commonly known as: PRILOSEC ?Take 20 mg by mouth 2 (two) times daily before a meal. ?  ?predniSONE 10 MG tablet ?Commonly known  as: DELTASONE ?4 tabs po day1,2; 3 tabs po day3,4; 2 tabs po day 5,6; 1 tab po day day 7,8; 1/2 tab po day9,10,11,12 ?  ?spironolactone 25 MG tablet ?Commonly known as: ALDACTONE ?Take 1/2 (one-half) tablet by mouth once daily ?  ?traMADol 50 MG tablet ?Commonly known as: ULTRAM ?Take 50-100 mg by mouth every 8 (eight) hours as needed for pain. ?  ?warfarin 5 MG tablet ?Commonly known as: COUMADIN ?Take as directed. If you are unsure how to take this medication, talk to your nurse or doctor. ?Original instructions: TAKE 1/2 TO 1 (ONE-HALF TO ONE) TABLET BY MOUTH ONCE DAILY AS  DIRECTED  BY  THE  COUMADIN  CLINIC.   Pt is past due for appt with Dr Mariah Milling and INR check. ?  ? ?  ? ? Follow-up Information   ? ? Etheleen Nicks, NP Follow up in 5 day(s).   ?Contact information: ?[address] [place] [address] ?[phone] ? ? ?  ?  ? ? Antonieta Iba, MD Follow up.   ?Specialty: Cardiology ?Why: keep apointment ?Contact information: ?[address] ?[address] ?[place] [address] ?[phone] ? ? ?  ?  ? ?  ?  ? ?  ? ?Discharge Exam: ?[name] Weights  ? 05/01/21 1709  ?Weight: 106.1 kg  ? ?Physical Exam ?HENT:  ?   Head: Normocephalic.  ?   Mouth/Throat:  ?   Pharynx: No oropharyngeal exudate.  ?   Comments: Tongue seems normal size ?Eyes:  ?   General: Lids are normal.  ?   Conjunctiva/sclera: Conjunctivae normal.  ?Cardiovascular:  ?   Rate and Rhythm: Normal rate and regular rhythm.  ?   Heart sounds: Normal heart sounds, S1 normal and S2 normal.  ?Pulmonary:  ?   Breath sounds: Normal breath sounds. No decreased breath sounds, wheezing, rhonchi or rales.  ?Abdominal:  ?   Palpations: Abdomen is soft.  ?   Tenderness: There is no abdominal tenderness.  ?Musculoskeletal:  ?   Right lower leg: No swelling.  ?   Left lower leg: No swelling.  ?Skin: ?   General: Skin is warm.  ?   Findings: No rash.  ?Neurological:  ?   Mental Status: He is alert and oriented to person, place, and time.  ?  ? ?Condition at  discharge: stable ? ?The results of significant diagnostics from this hospitalization (including imaging, microbiology, ancillary and laboratory) are listed below for reference.  ? ?Imaging Studies: ?DG Chest 2 View ? ?Result Date: 05/01/2021 ?CLINICAL DATA:  Shortness of breath EXAM: CHEST - 2 VIEW COMPARISON:  03/19/2018 FINDINGS: Left-sided pacing device as before. Mild cardiomegaly. Chronic posterior pleural thickening. Central airways thickening. No consolidation, pleural effusion or pneumothorax IMPRESSION: 1. Central airways thickening consistent with inflammatory process 2. Mild cardiomegaly Electronically Signed   By: Jasmine Pang M.D.   On: 05/01/2021 19:51   ? ?Microbiology: ?Results for orders placed or performed during the hospital encounter of  05/01/21  ?Resp Panel by RT-PCR (Flu A&B, Covid) Nasopharyngeal Swab     Status: None  ? Collection Time: 05/01/21  9:24 PM  ? Specimen: Nasopharyngeal Swab; Nasopharyngeal(NP) swabs in vial transport medium  ?Result Value Ref Range Status  ? SARS Coronavirus 2 by RT PCR NEGATIVE NEGATIVE Final  ?  Comment: (NOTE) ?SARS-CoV-2 target nucleic acids are NOT DETECTED. ? ?The SARS-CoV-2 RNA is generally detectable in upper respiratory ?specimens during the acute phase of infection. The lowest ?concentration of SARS-CoV-2 viral copies this assay can detect is ?138 copies/mL. A negative result does not preclude SARS-Cov-2 ?infection and should not be used as the sole basis for treatment or ?other patient management decisions. A negative result may occur with  ?improper specimen collection/handling, submission of specimen other ?than nasopharyngeal swab, presence of viral mutation(s) within the ?areas targeted by this assay, and inadequate number of viral ?copies(<138 copies/mL). A negative result must be combined with ?clinical observations, patient history, and epidemiological ?information. The expected result is Negative. ? ?Fact Sheet for Patients:   ?BloggerCourse.com ? ?Fact Sheet for Healthcare Providers:  ?SeriousBroker.it ? ?This test is no t yet approved or cleared by the [place] and  ?has been authorized

## 2021-05-02 NOTE — Assessment & Plan Note (Addendum)
On Coumadin 

## 2021-05-02 NOTE — Assessment & Plan Note (Addendum)
Continue Norvasc, Coreg, Lasix, spironolactone ?

## 2021-05-02 NOTE — Assessment & Plan Note (Addendum)
Improved on the day of discharge.  We will give a prednisone taper.  Continue to stay off Entresto. ?

## 2021-05-06 NOTE — Progress Notes (Deleted)
   Patient ID: Adam Douglas, male    DOB: Apr 02, 1950, 71 y.o.   MRN: [id number]  HPI  Adam Douglas is a 71 y/o male with a history of  Echo report from 09/13/20 reviewed and showed an EF of 50% along with moderate LVH/LAE and mild Adam.   LHC done 03/03/17 and showed: Ost 1st Mrg lesion is 80% stenosed. Ost 2nd Mrg lesion is 85% stenosed. Prox LAD lesion is 70% stenosed. Ost 1st Diag lesion is 90% stenosed. Mid LM lesion is 40% stenosed. 1st Mrg lesion is 90% stenosed. Mid RCA lesion is 30% stenosed. The left ventricular ejection fraction is less than 25% by visual estimate. LV end diastolic pressure is mildly elevated. There is severe left ventricular systolic dysfunction. Post Atrio lesion is 50% stenosed. There is no aortic valve stenosis. There is no mitral valve stenosis. Mid LAD lesion is 70% stenosed.  Admitted 05/01/21 due to tongue and throat swelling, gradual onset. No new medications. Given Solu-Medrol, Benadryl, and Pepcid along with an epi-pen. ENT consult obtained. Monitored overnight and released the next day. Was in the ED 04/14/21 due to tongue swelling thought to be due to entresto usage. Patient given epi H1 and H2 and steroids. Swelling starting to improve and he was released.   He presents today for his initial visit with a chief complaint of   Review of Systems    Physical Exam    Assessment & Plan:  1: Chronic heart failure with mildly reduced ejection fraction- - NYHA class - BNP 05/01/21 was 139.2  2: HTN- - BP - saw PCP Adam Douglas) 02/24/21 - BMP 05/02/21 reviewed and showed sodium 133, potassium 3.9, creatinine 1.32 & GFR 58  3: PAF- - saw cardiology Adam Douglas) 04/02/21  4: DM- - A1c 05/02/21 was 6.6%  5: Stroke-

## 2021-05-07 ENCOUNTER — Ambulatory Visit: Payer: Medicare HMO | Admitting: Family

## 2021-05-07 ENCOUNTER — Telehealth: Payer: Self-pay | Admitting: Family

## 2021-05-07 NOTE — Telephone Encounter (Signed)
Patient did not show for his [facility] appointment on 05/07/21. Will attempt to reschedule.   ?

## 2021-05-08 ENCOUNTER — Other Ambulatory Visit: Payer: Self-pay | Admitting: *Deleted

## 2021-05-08 ENCOUNTER — Encounter: Payer: Self-pay | Admitting: Nurse Practitioner

## 2021-05-08 ENCOUNTER — Ambulatory Visit (INDEPENDENT_AMBULATORY_CARE_PROVIDER_SITE_OTHER): Payer: Medicare HMO | Admitting: Nurse Practitioner

## 2021-05-08 ENCOUNTER — Other Ambulatory Visit
Admission: RE | Admit: 2021-05-08 | Discharge: 2021-05-08 | Disposition: A | Discharge status: Home / Self Care | Payer: Medicare HMO | Attending: Nurse Practitioner | Admitting: Nurse Practitioner

## 2021-05-08 VITALS — BP 140/80 | HR 64 | Ht 70.0 in | Wt 232.0 lb

## 2021-05-08 DIAGNOSIS — I480 Paroxysmal atrial fibrillation: Secondary | ICD-10-CM

## 2021-05-08 DIAGNOSIS — I2510 Atherosclerotic heart disease of native coronary artery without angina pectoris: Secondary | ICD-10-CM

## 2021-05-08 DIAGNOSIS — T783XXA Angioneurotic edema, initial encounter: Secondary | ICD-10-CM

## 2021-05-08 DIAGNOSIS — I255 Ischemic cardiomyopathy: Secondary | ICD-10-CM

## 2021-05-08 DIAGNOSIS — E785 Hyperlipidemia, unspecified: Secondary | ICD-10-CM

## 2021-05-08 DIAGNOSIS — I10 Essential (primary) hypertension: Secondary | ICD-10-CM | POA: Diagnosis present

## 2021-05-08 DIAGNOSIS — Z79899 Other long term (current) drug therapy: Secondary | ICD-10-CM

## 2021-05-08 DIAGNOSIS — I5022 Chronic systolic (congestive) heart failure: Secondary | ICD-10-CM

## 2021-05-08 LAB — BASIC METABOLIC PANEL
Anion gap: 10 (ref 5–15)
BUN: 21 mg/dL (ref 8–23)
CO2: 25 mmol/L (ref 22–32)
Calcium: 8.4 mg/dL — ABNORMAL LOW (ref 8.9–10.3)
Chloride: 103 mmol/L (ref 98–111)
Creatinine, Ser: 1.21 mg/dL (ref 0.61–1.24)
GFR, Estimated: >60 mL/min (ref >60)
Glucose, Bld: 128 mg/dL — ABNORMAL HIGH (ref 70–99)
Potassium: 3.8 mmol/L (ref 3.5–5.1)
Sodium: 138 mmol/L (ref 135–145)

## 2021-05-08 MED ORDER — AMLODIPINE BESYLATE 10 MG PO TABS: 10.0000 mg | ORAL_TABLET | Freq: Every day | ORAL | 1 refills | Status: AC

## 2021-05-08 MED ORDER — WARFARIN SODIUM 5 MG PO TABS: ORAL_TABLET | ORAL | 0 refills | Status: DC

## 2021-05-08 NOTE — Patient Instructions (Addendum)
Medication Instructions:  ?- Your physician has recommended you make the following change in your medication: ? ?1) INCREASE amlodipine to 10 mg: ?- take 1 tablet by mouth once daily  ? ?*If you need a refill on your cardiac medications before your next appointment, please call your pharmacy* ? ? ?Lab Work: ?- Your physician recommends that you have lab work today: BMP ? ?[facility] Entr[facility] at [facility] ?1st desk on the right to check in, past the screening table ?Lab hours: Monday- Friday (7:30 am- 5:30 pm) ? ? ?If you have labs (blood work) drawn today and your tests are completely normal, you will receive your results only by: ?MyChart Message (if you have MyChart) OR ?A paper copy in the mail ?If you have any lab test that is abnormal or we need to change your treatment, we will call you to review the results. ? ? ?Testing/Procedures: ?- none ordered ? ? ?Follow-Up: ?At [facility], you and your health needs are our priority.  As part of our continuing mission to provide you with exceptional heart care, we have created designated Provider Care Teams.  These Care Teams include your primary Cardiologist (physician) and Advanced Practice Providers (APPs -  Physician Assistants and Nurse Practitioners) who all work together to provide you with the care you need, when you need it. ? ?We recommend signing up for the patient portal called "MyChart".  Sign up information is provided on this After Visit Summary.  MyChart is used to connect with patients for Virtual Visits (Telemedicine).  Patients are able to view lab/test results, encounter notes, upcoming appointments, etc.  Non-urgent messages can be sent to your provider as well.   ?To learn more about what you can do with MyChart, go to [email or web address].   ? ?Your next appointment:   ?3 month(s) ? ?The format for your next appointment:   ?In Person ? ?Provider:   ?You may see Julien Nordmann, MD or one of the following Advanced Practice Providers on your  designated Care Team:   ?Nicolasa Ducking, NP ? ? ? ?Other Instructions ?N/a ? ?Important Information About Sugar ? ? ? ? ? ? ?

## 2021-05-08 NOTE — Progress Notes (Signed)
? ? ?Office Visit  ?  ?Patient Name: Adam Douglas ?Date of Encounter: 05/08/2021 ? ?Primary Care Provider:  Etheleen Nicks, NP ?Primary Cardiologist:  Adam Nordmann, MD ? ?Chief Complaint  ?  ?2 ? w/ a h/o CAD, chronic HFimpEF, ICM s/p biventricular ICD, DMII, HTN, HL, ocular strokes, obesity, and PAF, who presents for f/u of heart failure and CAD. ? ?Past Medical History  ?  ?Past Medical History:  ?Diagnosis Date  ? Angioedema   ? a. 04/2021 - presumed to be 2/2 entresto; b. 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  ? CAD (coronary artery disease)   ? a. 02/2017 NSTEMI/Cath: LM 40d, LAD 42m, 38m/d, D2 small w/ sev prox/m dzs, LCX nl, OM1 sev diff dzs - small vessel, OM2 80-90p/m, RCA dominant 87m, RPDA 50, EF <20%-->Med Rx.  ? Diabetes mellitus without complication (HCC)   ? 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 a. 02/2017 Echo: EF 20-25%; b. 03/2018 Echo: EF 30-35%, mild conc LVH. Impaired relaxation. Nl RV fxn. Mild BAE. Mild to mod MR. Mild Ao root dil; c. 09/2020 Echo: EF 50%, glob HK, mod LVH. GrI DD. Nl RV size/fxn. Mod dil LA. Mild MR.  ? Hyperlipidemia   ? Hypertension   ? Ischemic cardiomyopathy   ? a. 02/2017 Echo: EF 20-25%; b. 03/2018 Echo: EF 30-35%; c. 03/2018 s/p MDT [id number] [facility] MRI CRT-D; d. 09/2020 Echo: EF 50%.  ? LBBB (left bundle branch block)   ? a. Noted 02/2017. Pt denies any known prior history of LBBB (not present on 2014 [facility] ECG interpretation).  ? Morbid obesity (HCC)   ? Stroke [facility])   ? a. Ocular strokes x 3 - prev eval in [facility] for first 2, [facility] for last one.  ? ?Past Surgical History:  ?Procedure Laterality Date  ? BIV ICD INSERTION CRT-D N/A 03/18/2018  ? Procedure: BIV ICD INSERTION CRT-D;  Surgeon: Adam Salvia, MD;  Location: [facility] INVASIVE CV LAB;  Service: Cardiovascular;  Laterality: N/A;  ? CERVICAL DISC SURGERY    ? CHOLECYSTECTOMY    ? LEFT HEART CATH AND CORONARY ANGIOGRAPHY N/A 03/03/2017  ? Procedure: LEFT HEART CATH AND CORONARY  ANGIOGRAPHY;  Surgeon: Adam Iba, MD;  Location: [facility] INVASIVE CV LAB;  Service: Cardiovascular;  Laterality: N/A;  ? ? ?Allergies ? ?Allergies  ?Allergen Reactions  ? Entresto [Sacubitril-Valsartan] Swelling  ?  Of tongue  ? ? ?History of Present Illness  ?  ?71 y/o ?  With the above complex past medical history including CAD, chronic heart failure with improved ejection fraction, ischemic cardiomyopathy status post biventricular AICD, diabetes, hypertension, hyperlipidemia, ocular strokes, obesity, paroxysmal atrial fibrillation, and recent diagnosis of angioedema.  He suffered a non-STEMI in February 2019, with diagnostic catheterization revealing moderate to severe diffuse, small vessel CAD with an EF of less than 20% by ventriculography.  Echo showed an EF of 20 to 25%.  Medical therapy was recommended given diffuse nature of disease.  Despite Directed Medical Therapy, Patient Continued to Have Reduced LV Function, with an EF of 30 to 35% by Echo in March 2020, and Subsequently Underwent CRT-D.  He Was Later Diagnosed with Paroxysmal Atrial Fibrillation and Has Been Chronically Anticoagulated on Warfarin.  Most Recent Echo in September 2022 Showed an EF of 50%. ? ?Adam Douglas was last seen in cardiology clinic on March 29, at which time he was doing well from a cardiac standpoint with only trace ankle edema.  He noted  that he had been out of his Adam Douglas for several months and this was refilled.  Unfortunately, he presented to the emergency department on April 10 due to lip and tongue swelling consistent with angioedema.  It was felt that this was most likely secondary to [facility], which he had previously tolerated, and Entresto was discontinued.  Following treatment with steroids and antihistamines, he was discharged home.  He presented back to the emergency department on April 27 with recurrent oral swelling and angioedema.  He was seen by ENT.  He was treated with steroids, EpiPen, and antihistamine  and admitted overnight.  He was subsequently discharged April 28.  Since his hospitalization, he has done well from a heart failure standpoint.  He has chronic, stable dyspnea on exertion.  He denies chest pain, palpitations, PND, orthopnea, dizziness, syncope, edema, or early satiety.  His blood pressures have been running higher at home without Entresto.  Also, he has noted some return of throat swelling over the past week to 10 days.  He plans to follow-up with ear nose and throat. ? ?Home Medications  ?  ?Current Outpatient Medications  ?Medication Sig Dispense Refill  ? acetaminophen (TYLENOL) 650 MG CR tablet Take 1,300 mg by mouth 2 (two) times daily.    ? amLODipine (NORVASC) 10 MG tablet Take 1 tablet (10 mg total) by mouth daily. 90 tablet 1  ? aspirin EC 81 MG tablet Take 1 tablet (81 mg total) by mouth daily. 90 tablet 3  ? atorvastatin (LIPITOR) 80 MG tablet Take 80 mg by mouth daily.    ? carvedilol (COREG) 12.5 MG tablet Take 1 tablet by mouth twice daily 180 tablet 0  ? citalopram (CELEXA) 40 MG tablet Take 40 mg by mouth daily.    ? cyclobenzaprine (FLEXERIL) 10 MG tablet Take 10 mg by mouth 3 (three) times daily as needed for muscle spasms.    ? furosemide (LASIX) 40 MG tablet Take one tablet (40mg ) by mouth once daily, you may take one additional tablet (40mg ) once daily as needed for swelling or increase shortness of breath. 135 tablet 3  ? insulin aspart (NOVOLOG) 100 UNIT/ML injection Inject 15 Units into the skin 3 (three) times daily with meals.    ? insulin detemir (LEVEMIR) 100 UNIT/ML injection Inject 50 Units into the skin 2 (two) times daily.    ? metFORMIN (GLUCOPHAGE-XR) 500 MG 24 hr tablet Take 500 mg by mouth 2 (two) times daily.    ? nitroGLYCERIN (NITROSTAT) 0.4 MG SL tablet PLACE 1 TABLET UNDER THE TONGUE EVERY 5 MINUTES AS NEEDED 25 tablet 0  ? omeprazole (PRILOSEC) 20 MG capsule Take 20 mg by mouth 2 (two) times daily before a meal.    ? spironolactone (ALDACTONE) 25 MG tablet  Take 1/2 (one-half) tablet by mouth once daily 45 tablet 0  ? traMADol (ULTRAM) 50 MG tablet Take 50-100 mg by mouth every 8 (eight) hours as needed for pain.    ? EPINEPHrine 0.3 mg/0.3 mL IJ SOAJ injection Inject 0.3 mg into the muscle as needed for anaphylaxis. (Patient not taking: Reported on 05/08/2021) 2 each 0  ? predniSONE (DELTASONE) 10 MG tablet 4 tabs po day1,2; 3 tabs po day3,4; 2 tabs po day 5,6; 1 tab po day day 7,8; 1/2 tab po day9,10,11,12 (Patient not taking: Reported on 05/08/2021) 22 tablet 0  ? warfarin (COUMADIN) 5 MG tablet TAKE 1 TO 2 TABLETS BY MOUTH ONCE DAILY AS  DIRECTED  BY  THE  COUMADIN  CLINIC. 40 tablet 0  ? ?No current facility-administered medications for this visit.  ?  ? ?Review of Systems  ?  ?Has noted some fullness in the back of his throat over the past week to 10 days.  He denies chest pain, dyspnea, palpitations, PND, orthopnea, dizziness, syncope, edema, or early satiety.  All other systems reviewed and are otherwise negative except as noted above. ?  ? ?Physical Exam  ?  ?VS:  BP (!) 144/60 (BP Location: Left Arm, Patient Position: Sitting, Cuff Size: Normal)   Pulse 64   Ht 5\' 10"  (1.778 m)   Wt 232 lb (105.2 kg)   SpO2 97%   BMI 33.29 kg/m?  , BMI Body mass index is 33.29 kg/m?. ?    ?Vitals:  ? 05/08/21 1504 05/08/21 1710  ?BP: (!) 144/60 140/80  ?Pulse: 64   ?SpO2: 97%   ?  ?GEN: Well nourished, well developed, in no acute distress. ?HEENT: Mild swelling of the uvula. ?Neck: Supple, no JVD, carotid bruits, or masses. ?Cardiac: RRR, no murmurs, rubs, or gallops. No clubbing, cyanosis, Trace right ankle edema.  Radials/PT 2+ and equal bilaterally.  ?Respiratory:  Respirations regular and unlabored, clear to auscultation bilaterally. ?GI: Obese, Soft, nontender, nondistended, BS + x 4. ?MS: no deformity or atrophy. ?Skin: warm and dry, no rash. ?Neuro:  Strength and sensation are intact. ?Psych: Normal affect. ? ?Accessory Clinical Findings  ?  ?ECG personally reviewed  by me today -  Atrial sensed ventricular paced, 64- no acute changes. ? ?Lab Results  ?Component Value Date  ? WBC 14.4 (H) 05/02/2021  ? HGB 12.0 (L) 05/02/2021  ? HCT 35.6 (L) 05/02/2021  ? MCV 93.2 05/02/2021

## 2021-05-09 ENCOUNTER — Telehealth: Payer: Self-pay | Admitting: Nurse Practitioner

## 2021-05-09 NOTE — Telephone Encounter (Signed)
Theora Gianotti, NP  ?05/08/2021  5:33 PM EDT   ?  ?Kidney function stable/sl improved from recent hospitalization.  Lytes nl.  ? ?

## 2021-05-09 NOTE — Telephone Encounter (Signed)
1st attempt to call the patient. ?No answer- unable to leave message as the voice mail is full. ?  ?Will attempt to call back at a later time ?

## 2021-05-12 ENCOUNTER — Ambulatory Visit (INDEPENDENT_AMBULATORY_CARE_PROVIDER_SITE_OTHER): Payer: Medicare HMO

## 2021-05-12 DIAGNOSIS — Z95810 Presence of automatic (implantable) cardiac defibrillator: Secondary | ICD-10-CM | POA: Diagnosis not present

## 2021-05-12 DIAGNOSIS — I5022 Chronic systolic (congestive) heart failure: Secondary | ICD-10-CM

## 2021-05-13 NOTE — Telephone Encounter (Signed)
No answer. No voicemail. 

## 2021-05-14 NOTE — Progress Notes (Signed)
EPIC Encounter for ICM Monitoring ? ?Patient Name: Adam Douglas is a 71 y.o. male ?Date: 05/14/2021 ?Primary Care Physican: Etheleen Nicks, NP ?Primary Cardiologist: Mariah Milling ?Electrophysiologist: Graciela Husbands ?Bi-V Pacing: 97.2%         ?05/08/2021 Office Weight: 232 lbs ?                                                         ?  ?Transmission reviewed.  ED x 2 in April for angioedema thought to be r/t Entresto.   Pt seen by Ward Givens, NP on 5/4 ?  ?Optivol thoracic impedance suggesting normal fluid levels since 5/1.  Possible fluid accumulation from 4/4-4/30. ?  ?Prescribed:   ?Furosemide 40 mg Take one tablet (40mg ) by mouth once daily, you may take one additional tablet (40mg ) once daily as needed for swelling or increase shortness of breath. ?Spironolactone 25 mg take 0.5 tablet (12.5 mg total) daily ?  ?Labs: ?02/24/2021 Creatinine 0.97, BUN 20, Potassium 4.1, Sodium 138, GFR 83 ?A complete set of results can be found in Results Review. ?  ?Recommendations:  No changes   ?  ?Follow-up plan: ICM clinic phone appointment on 06/16/2021.    91 day device clinic remote transmission 06/13/2021.    ?  ?EP/Cardiology Next Office Visit:    Recall 03/09/2020 with Dr 08/13/2021.  08/08/2021 with Dr Graciela Husbands. ?  ?Copy of ICM check sent to Dr. 10/08/2021.   ? ?3 month ICM trend: 05/12/2021. ? ? ? ?12-14 Month ICM trend:  ? ? ? ?Graciela Husbands, RN ?05/14/2021 ?2:27 PM ? ?

## 2021-05-14 NOTE — Telephone Encounter (Signed)
No answer/Voicemail box is full.  

## 2021-05-15 ENCOUNTER — Encounter: Payer: Self-pay | Admitting: *Deleted

## 2021-05-15 NOTE — Telephone Encounter (Signed)
No answer/Voicemail box is full. This was attempt # 4. Sending letter with results to address on file.  ?

## 2021-05-17 ENCOUNTER — Encounter: Payer: Self-pay | Admitting: Intensive Care

## 2021-05-17 ENCOUNTER — Other Ambulatory Visit: Payer: Self-pay

## 2021-05-17 ENCOUNTER — Emergency Department
Admission: EM | Admit: 2021-05-17 | Discharge: 2021-05-17 | Disposition: A | Discharge status: Home / Self Care | Payer: Medicare HMO | Attending: Emergency Medicine | Admitting: Emergency Medicine

## 2021-05-17 DIAGNOSIS — Z950 Presence of cardiac pacemaker: Secondary | ICD-10-CM | POA: Diagnosis not present

## 2021-05-17 DIAGNOSIS — T783XXA Angioneurotic edema, initial encounter: Secondary | ICD-10-CM | POA: Insufficient documentation

## 2021-05-17 DIAGNOSIS — I10 Essential (primary) hypertension: Secondary | ICD-10-CM

## 2021-05-17 DIAGNOSIS — I110 Hypertensive heart disease with heart failure: Secondary | ICD-10-CM | POA: Diagnosis not present

## 2021-05-17 DIAGNOSIS — R739 Hyperglycemia, unspecified: Secondary | ICD-10-CM

## 2021-05-17 DIAGNOSIS — E1165 Type 2 diabetes mellitus with hyperglycemia: Secondary | ICD-10-CM | POA: Diagnosis not present

## 2021-05-17 DIAGNOSIS — I2510 Atherosclerotic heart disease of native coronary artery without angina pectoris: Secondary | ICD-10-CM | POA: Diagnosis not present

## 2021-05-17 DIAGNOSIS — R220 Localized swelling, mass and lump, head: Secondary | ICD-10-CM | POA: Diagnosis present

## 2021-05-17 DIAGNOSIS — Z7901 Long term (current) use of anticoagulants: Secondary | ICD-10-CM | POA: Insufficient documentation

## 2021-05-17 DIAGNOSIS — I509 Heart failure, unspecified: Secondary | ICD-10-CM | POA: Diagnosis not present

## 2021-05-17 HISTORY — DX: Heart failure, unspecified: I50.9

## 2021-05-17 HISTORY — DX: Presence of cardiac pacemaker: Z95.0

## 2021-05-17 LAB — CBC WITH DIFFERENTIAL/PLATELET
Abs Immature Granulocytes: 0.03 10*3/uL (ref 0.00–0.07)
Basophils Absolute: 0 10*3/uL (ref 0.0–0.1)
Basophils Relative: 0 %
Eosinophils Absolute: 0.1 10*3/uL (ref 0.0–0.5)
Eosinophils Relative: 1 %
HCT: 38.3 % — ABNORMAL LOW (ref 39.0–52.0)
Hemoglobin: 13 g/dL (ref 13.0–17.0)
Immature Granulocytes: 0 %
Lymphocytes Relative: 16 %
Lymphs Abs: 1.8 10*3/uL (ref 0.7–4.0)
MCH: 30.9 pg (ref 26.0–34.0)
MCHC: 33.9 g/dL (ref 30.0–36.0)
MCV: 91 fL (ref 80.0–100.0)
Monocytes Absolute: 0.7 10*3/uL (ref 0.1–1.0)
Monocytes Relative: 6 %
Neutro Abs: 8.3 10*3/uL — ABNORMAL HIGH (ref 1.7–7.7)
Neutrophils Relative %: 77 %
Platelets: 304 10*3/uL (ref 150–400)
RBC: 4.21 MIL/uL — ABNORMAL LOW (ref 4.22–5.81)
RDW: 12.9 % (ref 11.5–15.5)
WBC: 10.9 10*3/uL — ABNORMAL HIGH (ref 4.0–10.5)
nRBC: 0 % (ref 0.0–0.2)

## 2021-05-17 LAB — PROTIME-INR
INR: 2 — ABNORMAL HIGH (ref 0.8–1.2)
Prothrombin Time: 22.5 seconds — ABNORMAL HIGH (ref 11.4–15.2)

## 2021-05-17 LAB — COMPREHENSIVE METABOLIC PANEL
ALT: 22 U/L (ref 0–44)
AST: 25 U/L (ref 15–41)
Albumin: 3.4 g/dL — ABNORMAL LOW (ref 3.5–5.0)
Alkaline Phosphatase: 81 U/L (ref 38–126)
Anion gap: 9 (ref 5–15)
BUN: 19 mg/dL (ref 8–23)
CO2: 23 mmol/L (ref 22–32)
Calcium: 8.4 mg/dL — ABNORMAL LOW (ref 8.9–10.3)
Chloride: 103 mmol/L (ref 98–111)
Creatinine, Ser: 1.18 mg/dL (ref 0.61–1.24)
GFR, Estimated: >60 mL/min (ref >60)
Glucose, Bld: 250 mg/dL — ABNORMAL HIGH (ref 70–99)
Potassium: 3.9 mmol/L (ref 3.5–5.1)
Sodium: 135 mmol/L (ref 135–145)
Total Bilirubin: 0.7 mg/dL (ref 0.3–1.2)
Total Protein: 7.2 g/dL (ref 6.5–8.1)

## 2021-05-17 MED ORDER — DIPHENHYDRAMINE HCL 50 MG/ML IJ SOLN
50.0000 mg | Freq: Once | INTRAMUSCULAR | Status: AC
  Administered 2021-05-17: 50 mg via INTRAVENOUS
  Filled 2021-05-17: qty 1

## 2021-05-17 MED ORDER — FAMOTIDINE IN NACL 20-0.9 MG/50ML-% IV SOLN
20.0000 mg | Freq: Once | INTRAVENOUS | Status: AC
Start: 2021-05-17 — End: 2021-05-17
  Administered 2021-05-17: 20 mg via INTRAVENOUS
  Filled 2021-05-17: qty 50

## 2021-05-17 MED ORDER — EPINEPHRINE 0.3 MG/0.3ML IJ SOAJ: 0.3000 mg | INTRAMUSCULAR | 0 refills | Status: DC | PRN

## 2021-05-17 MED ORDER — INSULIN ASPART 100 UNIT/ML IJ SOLN: 0.0000 [IU] | INTRAMUSCULAR | Status: DC

## 2021-05-17 MED ORDER — METHYLPREDNISOLONE SODIUM SUCC 125 MG IJ SOLR
125.0000 mg | Freq: Once | INTRAMUSCULAR | Status: AC
  Administered 2021-05-17: 125 mg via INTRAVENOUS
  Filled 2021-05-17: qty 2

## 2021-05-17 NOTE — ED Triage Notes (Signed)
Patient presents with tongue swelling that he reports he woke up with. Went to bed with no swelling. Wife reports she gave him a epi pen in right thigh around 12:00pm today. Similar episode happened 04/14/21 and they are unsure what his reactions are coming from.  ?

## 2021-05-17 NOTE — ED Provider Notes (Signed)
? ?[facility] ?Provider Note ? ? ? Event Date/Time  ? First MD Initiated Contact with Patient 05/17/21 1414   ?  (approximate) ? ? ?History  ? ?Allergic Reaction and Oral Swelling ? ? ?HPI ? ?Zebulin Siegel is a 71 y.o. male with a past medical history of CAD, CVA on Coumadin, DM, CHF, HTN, HDL and recurrent angioedema of the tongue who presents for evaluation of recurrence of swelling of his tongue.  Patient states this is the third time this is happened recently when he was hospitalized and discharged on 4/28.  He states he did not notice until he woke up this morning.  He notes he recently stopped his Sherryll Burger which his physicians thought may be contributing.  He has not had any new medications and denies any new foods or chemical exposures.  He denies any other clear associated symptoms including fevers, chills, nausea, vomiting, diarrhea, rash or itching.  He has no chest pain, Donnell pain fevers, cough or any other concerns.  His wife administered an epinephrine pen around noon when she noticed it.  He feels it is gotten better since he received epinephrine pen. ? ?  ?Past Medical History:  ?Diagnosis Date  ? Angioedema   ? a. 04/2021 - presumed to be 2/2 entresto; b. 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  ? CAD (coronary artery disease)   ? a. 02/2017 NSTEMI/Cath: LM 40d, LAD 81m, 11m/d, D2 small w/ sev prox/m dzs, LCX nl, OM1 sev diff dzs - small vessel, OM2 80-90p/m, RCA dominant 22m, RPDA 50, EF <20%-->Med Rx.  ? CHF (congestive heart failure) (HCC)   ? Diabetes mellitus without complication (HCC)   ? 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 a. 02/2017 Echo: EF 20-25%; b. 03/2018 Echo: EF 30-35%, mild conc LVH. Impaired relaxation. Nl RV fxn. Mild BAE. Mild to mod MR. Mild Ao root dil; c. 09/2020 Echo: EF 50%, glob HK, mod LVH. GrI DD. Nl RV size/fxn. Mod dil LA. Mild MR.  ? Hyperlipidemia   ? Hypertension   ? Ischemic cardiomyopathy   ? a. 02/2017 Echo:  EF 20-25%; b. 03/2018 Echo: EF 30-35%; c. 03/2018 s/p MDT [id number] [facility] MRI CRT-D; d. 09/2020 Echo: EF 50%.  ? LBBB (left bundle branch block)   ? a. Noted 02/2017. Pt denies any known prior history of LBBB (not present on 2014 [facility] ECG interpretation).  ? Morbid obesity (HCC)   ? Pacemaker   ? Stroke [facility])   ? a. Ocular strokes x 3 - prev eval in [facility] for first 2, [facility] for last one.  ? ? ? ?Physical Exam  ?Triage Vital Signs: ?ED Triage Vitals  ?Enc Vitals Group  ?   BP 05/17/21 1226 (!) 170/76  ?   Pulse Rate 05/17/21 1226 77  ?   Resp 05/17/21 1226 20  ?   Temp 05/17/21 1226 98.3 ?F (36.8 ?C)  ?   Temp Source 05/17/21 1226 Oral  ?   SpO2 05/17/21 1226 95 %  ?   Weight 05/17/21 1227 232 lb (105.2 kg)  ?   Height 05/17/21 1227 5\' 10"  (1.778 m)  ?   Head Circumference --   ?   Peak Flow --   ?   Pain Score 05/17/21 1227 0  ?   Pain Loc --   ?   Pain Edu? --   ?   Excl. in GC? --   ? ? ?Most recent vital signs: ?  Vitals:  ? 05/17/21 1226  ?BP: (!) 170/76  ?Pulse: 77  ?Resp: 20  ?Temp: 98.3 ?F (36.8 ?C)  ?SpO2: 95%  ? ? ?General: Awake, no distress.  ?CV:  Good peripheral perfusion.  2+ radial pulse. ?Resp:  Normal effort.  Clear bilaterally ?Abd:  No distention.  Soft throughout. ?Other:  Patient's tongue is swollen.  I am able to visualize his posterior oropharynx.  There is no exudates, significant tonsillar enlargement, uvular deviation or stridor of the neck.  Patient has full range of motion of his neck.  No significant lip swelling.  Cranial nerves II through XII are grossly intact.  There is no rash over the extremities. ? ? ?ED Results / Procedures / Treatments  ?Labs ?(all labs ordered are listed, but only abnormal results are displayed) ?Labs Reviewed  ?CBC WITH DIFFERENTIAL/PLATELET - Abnormal; Notable for the following components:  ?    Result Value  ? WBC 10.9 (*)   ? RBC 4.21 (*)   ? HCT 38.3 (*)   ? Neutro Abs 8.3 (*)   ? All other components within normal limits  ?COMPREHENSIVE METABOLIC PANEL -  Abnormal; Notable for the following components:  ? Glucose, Bld 250 (*)   ? Calcium 8.4 (*)   ? Albumin 3.4 (*)   ? All other components within normal limits  ?PROTIME-INR - Abnormal; Notable for the following components:  ? Prothrombin Time 22.5 (*)   ? INR 2.0 (*)   ? All other components within normal limits  ? ? ? ?EKG ?\ ? ?RADIOLOGY ? ? ?PROCEDURES: ? ?Critical Care performed: No ? ?Procedures ? ? ? ?MEDICATIONS ORDERED IN ED: ?Medications  ?insulin aspart (novoLOG) injection 0-15 Units (has no administration in time range)  ?methylPREDNISolone sodium succinate (SOLU-MEDROL) 125 mg/2 mL injection 125 mg (125 mg Intravenous Given 05/17/21 1447)  ?famotidine (PEPCID) IVPB 20 mg premix (20 mg Intravenous New Bag/Given 05/17/21 1447)  ?diphenhydrAMINE (BENADRYL) injection 50 mg (50 mg Intravenous Given 05/17/21 1447)  ? ? ? ?IMPRESSION / MDM / ASSESSMENT AND PLAN / ED COURSE  ?I reviewed the triage vital signs and the nursing notes. ?             ?               ? ?Patient presents with above-stated history exam for evaluation of recurrent angioedema without clear source.  He is no longer on Entresto or any ACE inhibitors.  He cannot identify any chemical or other possible recent allergens.  He states he started feeling much better after his wife given epinephrine around noon.  He has no other associated symptoms and I think it is appropriate given he seems to improve with epinephrine he does not have any other evidence of other organ involvement to suggest anaphylaxis.  History and exam is not suggestive of an acute infectious process, trauma or toxic ingestion.  CMP obtained in triage shows hyperglycemia with a glucose of 250 without any other significant lecture metabolic derangements.  I advised patient of this suspect is related to steroids given recently.  He was given a dose of sliding-scale insulin and emphasized importance of having this rechecked.  CBC today shows WBC count of 10.9 compared to 14.42 weeks  ago.  No acute anemia and normal platelets.  INR is therapeutic at 2. ? ?Patient observed until 3:20 PM.  He states he feels basically back to normal and denies any other acute complaints.  At this point do not believe  requires further observation given its been 3 hours since he received epinephrine.  Discussed importance of keeping EpiPen with him at all times.  Rx for refill provided.  We will call his allergist on Monday.  Advised to have his sugar rechecked as well as his blood pressure.  Discharged in stable condition.  Strict return precautions advised and discussed. ? ?  ? ? ?FINAL CLINICAL IMPRESSION(S) / ED DIAGNOSES  ? ?Final diagnoses:  ?Angioedema, initial encounter  ?Hyperglycemia  ?Hypertension, unspecified type  ? ? ? ?Rx / DC Orders  ? ?ED Discharge Orders   ? ?      Ordered  ?  EPINEPHrine 0.3 mg/0.3 mL IJ SOAJ injection  As needed       ? 05/17/21 1522  ? ?  ?  ? ?  ? ? ? ?Note:  This document was prepared using Dragon voice recognition software and may include unintentional dictation errors. ?  ?Gilles Chiquito, MD ?05/17/21 1525 ? ?

## 2021-05-17 NOTE — ED Notes (Signed)
Pt A&O, IV removed by another staff member, pt given discharge instructions, pt ambulating with steady gait. ?

## 2021-05-21 ENCOUNTER — Ambulatory Visit (INDEPENDENT_AMBULATORY_CARE_PROVIDER_SITE_OTHER): Payer: Medicare HMO

## 2021-05-21 DIAGNOSIS — Z8673 Personal history of transient ischemic attack (TIA), and cerebral infarction without residual deficits: Secondary | ICD-10-CM | POA: Diagnosis not present

## 2021-05-21 DIAGNOSIS — Z5181 Encounter for therapeutic drug level monitoring: Secondary | ICD-10-CM | POA: Diagnosis not present

## 2021-05-21 DIAGNOSIS — I480 Paroxysmal atrial fibrillation: Secondary | ICD-10-CM | POA: Diagnosis not present

## 2021-05-21 LAB — POCT INR: INR: 2.5 (ref 2.0–3.0)

## 2021-05-21 MED ORDER — WARFARIN SODIUM 5 MG PO TABS: ORAL_TABLET | ORAL | 1 refills | Status: DC

## 2021-05-21 NOTE — Patient Instructions (Signed)
-   continue dosage of warfarin dosage of 1 tablet (5 mg) every day EXCEPT 2 tablets on MONDAYS, WEDNESDAYS AND FRIDAYS ?- recheck in 7 weeks ?

## 2021-06-13 ENCOUNTER — Ambulatory Visit (INDEPENDENT_AMBULATORY_CARE_PROVIDER_SITE_OTHER): Payer: Medicare HMO

## 2021-06-13 DIAGNOSIS — I255 Ischemic cardiomyopathy: Secondary | ICD-10-CM

## 2021-06-13 LAB — CUP PACEART REMOTE DEVICE CHECK
Battery Remaining Longevity: 76 mo
Battery Voltage: 2.99 V
Brady Statistic AP VP Percent: 18.12 %
Brady Statistic AP VS Percent: 0.29 %
Brady Statistic AS VP Percent: 80.13 %
Brady Statistic AS VS Percent: 1.46 %
Brady Statistic RA Percent Paced: 18.38 %
Brady Statistic RV Percent Paced: 15.24 %
Date Time Interrogation Session: 20230609001803
HighPow Impedance: 73 Ohm
Implantable Lead Implant Date: 20200313
Implantable Lead Implant Date: 20200313
Implantable Lead Implant Date: 20200313
Implantable Lead Location: 753858
Implantable Lead Location: 753859
Implantable Lead Location: 753860
Implantable Lead Model: 4396
Implantable Lead Model: 5076
Implantable Lead Model: 6935
Implantable Pulse Generator Implant Date: 20200313
Lead Channel Impedance Value: 285 Ohm
Lead Channel Impedance Value: 342 Ohm
Lead Channel Impedance Value: 361 Ohm
Lead Channel Impedance Value: 456 Ohm
Lead Channel Impedance Value: 589 Ohm
Lead Channel Impedance Value: 893 Ohm
Lead Channel Pacing Threshold Amplitude: 0.5 V
Lead Channel Pacing Threshold Amplitude: 0.875 V
Lead Channel Pacing Threshold Amplitude: 1.5 V
Lead Channel Pacing Threshold Pulse Width: 0.4 ms
Lead Channel Pacing Threshold Pulse Width: 0.4 ms
Lead Channel Pacing Threshold Pulse Width: 0.4 ms
Lead Channel Sensing Intrinsic Amplitude: 1.75 mV
Lead Channel Sensing Intrinsic Amplitude: 1.75 mV
Lead Channel Sensing Intrinsic Amplitude: 8.125 mV
Lead Channel Sensing Intrinsic Amplitude: 8.125 mV
Lead Channel Setting Pacing Amplitude: 1.5 V
Lead Channel Setting Pacing Amplitude: 2 V
Lead Channel Setting Pacing Amplitude: 2 V
Lead Channel Setting Pacing Pulse Width: 0.4 ms
Lead Channel Setting Pacing Pulse Width: 0.4 ms
Lead Channel Setting Sensing Sensitivity: 0.3 mV

## 2021-06-16 ENCOUNTER — Ambulatory Visit (INDEPENDENT_AMBULATORY_CARE_PROVIDER_SITE_OTHER): Payer: Medicare HMO

## 2021-06-16 DIAGNOSIS — Z95810 Presence of automatic (implantable) cardiac defibrillator: Secondary | ICD-10-CM | POA: Diagnosis not present

## 2021-06-16 DIAGNOSIS — I5022 Chronic systolic (congestive) heart failure: Secondary | ICD-10-CM

## 2021-06-17 ENCOUNTER — Other Ambulatory Visit: Payer: Self-pay | Admitting: Cardiovascular Disease

## 2021-06-18 NOTE — Progress Notes (Signed)
EPIC Encounter for ICM Monitoring  Patient Name: Adam Douglas is a 71 y.o. male Date: 06/18/2021 Primary Care Physican: Etheleen Nicks, NP Primary Cardiologist: Mariah Milling Electrophysiologist: Joycelyn Schmid Pacing: 97.6%         05/08/2021 Office Weight: 232 lbs                                                            Transmission reviewed.     Optivol thoracic impedance suggesting normal fluid levels.   Prescribed:   Furosemide 40 mg Take one tablet (40mg ) by mouth once daily, you may take one additional tablet (40mg ) once daily as needed for swelling or increase shortness of breath. Spironolactone 25 mg take 0.5 tablet (12.5 mg total) daily   Labs: 05/26/2021 Creatinine 0.99, BUN 24, Potassium 4.3, Sodium 135, GFR 81 05/17/2021 Creatinine 1.18, BUN 19, Potassium 3.9, Sodium 135, GFR >60  05/08/2021 Creatinine 1.21, BUN 21, Potassium 3.8, Sodium 138, GFR >60  05/02/2021 Creatinine 1.32, BUN 24, Potassium 3.9, Sodium 133, GFR 58  05/01/2021 Creatinine 1.03, BUN 17, Potassium 3.6, Sodium 136, GFR >60 04/14/2021 Creatinine 0.88, BUN 16, Potassium 2.7, Sodium 139, GFR >60  02/24/2021 Creatinine 0.97, BUN 20, Potassium 4.1, Sodium 138, GFR 83 A complete set of results can be found in Results Review.   Recommendations:  No changes     Follow-up plan: ICM clinic phone appointment on 07/21/2021.    91 day device clinic remote transmission 09/12/2021.      EP/Cardiology Next Office Visit:    Recall 03/09/2020 with Dr 11/12/2021.  08/08/2021 with Dr Graciela Husbands.   Copy of ICM check sent to Dr. 10/08/2021.    3 month ICM trend: 06/16/2021.    12-14 Month ICM trend:     Graciela Husbands, RN 06/18/2021 1:30 PM

## 2021-06-19 NOTE — Progress Notes (Signed)
Remote ICD transmission.   

## 2021-06-30 ENCOUNTER — Other Ambulatory Visit: Payer: Self-pay | Admitting: Cardiovascular Disease

## 2021-07-09 ENCOUNTER — Ambulatory Visit (INDEPENDENT_AMBULATORY_CARE_PROVIDER_SITE_OTHER): Payer: Medicare HMO

## 2021-07-09 DIAGNOSIS — I480 Paroxysmal atrial fibrillation: Secondary | ICD-10-CM | POA: Diagnosis not present

## 2021-07-09 DIAGNOSIS — Z8673 Personal history of transient ischemic attack (TIA), and cerebral infarction without residual deficits: Secondary | ICD-10-CM | POA: Diagnosis not present

## 2021-07-09 DIAGNOSIS — Z5181 Encounter for therapeutic drug level monitoring: Secondary | ICD-10-CM | POA: Diagnosis not present

## 2021-07-09 LAB — POCT INR: INR: 4.3 — AB (ref 2.0–3.0)

## 2021-07-09 NOTE — Patient Instructions (Signed)
-   HOLD TODAY ONLY and then - continue dosage of warfarin dosage of 1 tablet (5 mg) every day EXCEPT 2 tablets on MONDAYS, WEDNESDAYS AND FRIDAYS - recheck in 4 weeks

## 2021-07-21 ENCOUNTER — Ambulatory Visit (INDEPENDENT_AMBULATORY_CARE_PROVIDER_SITE_OTHER): Payer: Medicare HMO

## 2021-07-21 DIAGNOSIS — Z95810 Presence of automatic (implantable) cardiac defibrillator: Secondary | ICD-10-CM

## 2021-07-21 DIAGNOSIS — I5022 Chronic systolic (congestive) heart failure: Secondary | ICD-10-CM | POA: Diagnosis not present

## 2021-07-25 NOTE — Progress Notes (Signed)
EPIC Encounter for ICM Monitoring  Patient Name: Adam Douglas is a 71 y.o. male Date: 07/25/2021 Primary Care Physican: Etheleen Nicks, NP Primary Cardiologist: Mariah Milling Electrophysiologist: Joycelyn Schmid Pacing: 97.8%         05/08/2021 Office Weight: 232 lbs                                                            Transmission reviewed.     Optivol thoracic impedance suggesting normal fluid levels.  6/12-7/10   Prescribed:   Furosemide 40 mg Take one tablet (40mg ) by mouth once daily, you may take one additional tablet (40mg ) once daily as needed for swelling or increase shortness of breath. Spironolactone 25 mg take 0.5 tablet (12.5 mg total) daily   Labs: 05/26/2021 Creatinine 0.99, BUN 24, Potassium 4.3, Sodium 135, GFR 81 05/17/2021 Creatinine 1.18, BUN 19, Potassium 3.9, Sodium 135, GFR >60  05/08/2021 Creatinine 1.21, BUN 21, Potassium 3.8, Sodium 138, GFR >60  05/02/2021 Creatinine 1.32, BUN 24, Potassium 3.9, Sodium 133, GFR 58  05/01/2021 Creatinine 1.03, BUN 17, Potassium 3.6, Sodium 136, GFR >60 04/14/2021 Creatinine 0.88, BUN 16, Potassium 2.7, Sodium 139, GFR >60  02/24/2021 Creatinine 0.97, BUN 20, Potassium 4.1, Sodium 138, GFR 83 A complete set of results can be found in Results Review.   Recommendations:  No changes     Follow-up plan: ICM clinic phone appointment on 08/25/2021.    91 day device clinic remote transmission 09/12/2021.      EP/Cardiology Next Office Visit:    Recall 03/09/2020 with Dr 11/12/2021.  08/08/2021 with Dr Graciela Husbands.   Copy of ICM check sent to Dr. 10/08/2021.    3 month ICM trend: 07/21/2021.    12-14 Month ICM trend:     Graciela Husbands, RN 07/25/2021 11:03 AM

## 2021-08-06 ENCOUNTER — Ambulatory Visit (INDEPENDENT_AMBULATORY_CARE_PROVIDER_SITE_OTHER): Payer: Medicare HMO

## 2021-08-06 DIAGNOSIS — Z8673 Personal history of transient ischemic attack (TIA), and cerebral infarction without residual deficits: Secondary | ICD-10-CM | POA: Diagnosis not present

## 2021-08-06 DIAGNOSIS — Z5181 Encounter for therapeutic drug level monitoring: Secondary | ICD-10-CM | POA: Diagnosis not present

## 2021-08-06 DIAGNOSIS — I480 Paroxysmal atrial fibrillation: Secondary | ICD-10-CM

## 2021-08-06 LAB — POCT INR: INR: 4.8 — AB (ref 2.0–3.0)

## 2021-08-06 NOTE — Patient Instructions (Signed)
-   HOLD TODAY AND THURSDAY ONLY and then - DECREASE TO 1 tablet (5 mg) every day EXCEPT 2 tablets on MONDAYS AND FRIDAYS - recheck in 3 weeks

## 2021-08-08 ENCOUNTER — Ambulatory Visit: Payer: Medicare HMO | Admitting: Cardiovascular Disease

## 2021-08-25 ENCOUNTER — Ambulatory Visit (INDEPENDENT_AMBULATORY_CARE_PROVIDER_SITE_OTHER): Payer: Medicare HMO

## 2021-08-25 DIAGNOSIS — I5022 Chronic systolic (congestive) heart failure: Secondary | ICD-10-CM

## 2021-08-25 DIAGNOSIS — Z95810 Presence of automatic (implantable) cardiac defibrillator: Secondary | ICD-10-CM | POA: Diagnosis not present

## 2021-08-27 ENCOUNTER — Ambulatory Visit (INDEPENDENT_AMBULATORY_CARE_PROVIDER_SITE_OTHER): Payer: Medicare HMO

## 2021-08-27 DIAGNOSIS — Z5181 Encounter for therapeutic drug level monitoring: Secondary | ICD-10-CM

## 2021-08-27 DIAGNOSIS — I480 Paroxysmal atrial fibrillation: Secondary | ICD-10-CM

## 2021-08-27 DIAGNOSIS — Z8673 Personal history of transient ischemic attack (TIA), and cerebral infarction without residual deficits: Secondary | ICD-10-CM

## 2021-08-27 LAB — POCT INR: INR: 5.8 — AB (ref 2.0–3.0)

## 2021-08-27 NOTE — Progress Notes (Signed)
EPIC Encounter for ICM Monitoring  Patient Name: Adam Douglas is a 70 y.o. male Date: 08/27/2021 Primary Care Physican: Etheleen Nicks, NP Primary Cardiologist: Mariah Milling Electrophysiologist: Joycelyn Schmid Pacing: 97.8%         05/08/2021 Office Weight: 232 lbs                                                            Transmission reviewed.     Optivol thoracic impedance suggesting normal fluid levels.     Prescribed:   Furosemide 40 mg Take one tablet (40mg ) by mouth once daily, you may take one additional tablet (40mg ) once daily as needed for swelling or increase shortness of breath. Spironolactone 25 mg take 0.5 tablet (12.5 mg total) daily   Labs: 05/26/2021 Creatinine 0.99, BUN 24, Potassium 4.3, Sodium 135, GFR 81 05/17/2021 Creatinine 1.18, BUN 19, Potassium 3.9, Sodium 135, GFR >60  05/08/2021 Creatinine 1.21, BUN 21, Potassium 3.8, Sodium 138, GFR >60  05/02/2021 Creatinine 1.32, BUN 24, Potassium 3.9, Sodium 133, GFR 58  05/01/2021 Creatinine 1.03, BUN 17, Potassium 3.6, Sodium 136, GFR >60 04/14/2021 Creatinine 0.88, BUN 16, Potassium 2.7, Sodium 139, GFR >60  02/24/2021 Creatinine 0.97, BUN 20, Potassium 4.1, Sodium 138, GFR 83 A complete set of results can be found in Results Review.   Recommendations:  No changes     Follow-up plan: ICM clinic phone appointment on 09/29/2021.    91 day device clinic remote transmission 09/12/2021.      EP/Cardiology Next Office Visit:   10/28/2021 with Dr 11/12/2021.  10/03/2021 with Dr Graciela Husbands.   Copy of ICM check sent to Dr. 10/05/2021.    3 month ICM trend: 08/27/2021.    12-14 Month ICM trend:     Graciela Husbands, RN 08/27/2021 2:31 PM

## 2021-08-27 NOTE — Patient Instructions (Signed)
-   HOLD TODAY, THURSDAY and FRIDAY ONLY and then - DECREASE TO 1 tablet (5 mg) every day. - recheck in 2 weeks

## 2021-09-10 ENCOUNTER — Ambulatory Visit: Payer: Medicare HMO | Attending: Cardiovascular Disease

## 2021-09-10 DIAGNOSIS — Z5181 Encounter for therapeutic drug level monitoring: Secondary | ICD-10-CM | POA: Diagnosis not present

## 2021-09-10 DIAGNOSIS — Z8673 Personal history of transient ischemic attack (TIA), and cerebral infarction without residual deficits: Secondary | ICD-10-CM

## 2021-09-10 DIAGNOSIS — I480 Paroxysmal atrial fibrillation: Secondary | ICD-10-CM

## 2021-09-10 LAB — POCT INR: INR: 2.6 (ref 2.0–3.0)

## 2021-09-10 NOTE — Patient Instructions (Signed)
-   CONTINUE 1 tablet (5 mg) every day. - recheck in 4 weeks

## 2021-09-12 ENCOUNTER — Ambulatory Visit (INDEPENDENT_AMBULATORY_CARE_PROVIDER_SITE_OTHER): Payer: Medicare HMO

## 2021-09-12 DIAGNOSIS — I480 Paroxysmal atrial fibrillation: Secondary | ICD-10-CM

## 2021-09-12 DIAGNOSIS — I5022 Chronic systolic (congestive) heart failure: Secondary | ICD-10-CM

## 2021-09-16 LAB — CUP PACEART REMOTE DEVICE CHECK
Battery Remaining Longevity: 71 mo
Battery Voltage: 2.98 V
Brady Statistic AP VP Percent: 22.63 %
Brady Statistic AP VS Percent: 0.36 %
Brady Statistic AS VP Percent: 75.82 %
Brady Statistic AS VS Percent: 1.18 %
Brady Statistic RA Percent Paced: 22.92 %
Brady Statistic RV Percent Paced: 14.8 %
Date Time Interrogation Session: 20230908012408
HighPow Impedance: 66 Ohm
Implantable Lead Implant Date: 20200313
Implantable Lead Implant Date: 20200313
Implantable Lead Implant Date: 20200313
Implantable Lead Location: 753858
Implantable Lead Location: 753859
Implantable Lead Location: 753860
Implantable Lead Model: 4396
Implantable Lead Model: 5076
Implantable Lead Model: 6935
Implantable Pulse Generator Implant Date: 20200313
Lead Channel Impedance Value: 285 Ohm
Lead Channel Impedance Value: 342 Ohm
Lead Channel Impedance Value: 342 Ohm
Lead Channel Impedance Value: 399 Ohm
Lead Channel Impedance Value: 475 Ohm
Lead Channel Impedance Value: 779 Ohm
Lead Channel Pacing Threshold Amplitude: 0.625 V
Lead Channel Pacing Threshold Amplitude: 0.875 V
Lead Channel Pacing Threshold Amplitude: 1.5 V
Lead Channel Pacing Threshold Pulse Width: 0.4 ms
Lead Channel Pacing Threshold Pulse Width: 0.4 ms
Lead Channel Pacing Threshold Pulse Width: 0.4 ms
Lead Channel Sensing Intrinsic Amplitude: 2.25 mV
Lead Channel Sensing Intrinsic Amplitude: 2.25 mV
Lead Channel Sensing Intrinsic Amplitude: 7.875 mV
Lead Channel Sensing Intrinsic Amplitude: 7.875 mV
Lead Channel Setting Pacing Amplitude: 1.5 V
Lead Channel Setting Pacing Amplitude: 2 V
Lead Channel Setting Pacing Amplitude: 2 V
Lead Channel Setting Pacing Pulse Width: 0.4 ms
Lead Channel Setting Pacing Pulse Width: 0.4 ms
Lead Channel Setting Sensing Sensitivity: 0.3 mV

## 2021-09-26 NOTE — Progress Notes (Signed)
Remote ICD transmission.   

## 2021-09-28 ENCOUNTER — Other Ambulatory Visit: Payer: Self-pay | Admitting: Cardiovascular Disease

## 2021-09-29 ENCOUNTER — Ambulatory Visit (INDEPENDENT_AMBULATORY_CARE_PROVIDER_SITE_OTHER): Payer: Medicare HMO

## 2021-09-29 DIAGNOSIS — Z95810 Presence of automatic (implantable) cardiac defibrillator: Secondary | ICD-10-CM | POA: Diagnosis not present

## 2021-09-29 DIAGNOSIS — I5022 Chronic systolic (congestive) heart failure: Secondary | ICD-10-CM

## 2021-09-29 NOTE — Progress Notes (Unsigned)
EPIC Encounter for ICM Monitoring  Patient Name: Adam Douglas is a 71 y.o. male Date: 09/29/2021 Primary Care Physican: Verita Lamb, NP Primary Cardiologist: Rockey Situ Electrophysiologist: Vergie Living Pacing: 98.1%         05/08/2021 Office Weight: 232 lbs                                                             Attempted call to wife per DPR and unable to reach.   Transmission reviewed.     Optivol thoracic impedance suggesting suggesting intermittent days with possible fluid accumulation since 9/4.     Prescribed:   Furosemide 40 mg Take one tablet (40mg ) by mouth once daily, you may take one additional tablet (40mg ) once daily as needed for swelling or increase shortness of breath. Spironolactone 25 mg take 0.5 tablet (12.5 mg total) daily   Labs: 05/26/2021 Creatinine 0.99, BUN 24, Potassium 4.3, Sodium 135, GFR 81 05/17/2021 Creatinine 1.18, BUN 19, Potassium 3.9, Sodium 135, GFR >60  05/08/2021 Creatinine 1.21, BUN 21, Potassium 3.8, Sodium 138, GFR >60  05/02/2021 Creatinine 1.32, BUN 24, Potassium 3.9, Sodium 133, GFR 58  05/01/2021 Creatinine 1.03, BUN 17, Potassium 3.6, Sodium 136, GFR >60 04/14/2021 Creatinine 0.88, BUN 16, Potassium 2.7, Sodium 139, GFR >60  02/24/2021 Creatinine 0.97, BUN 20, Potassium 4.1, Sodium 138, GFR 83 A complete set of results can be found in Results Review.   Recommendations:  Unable to reach.     Follow-up plan: ICM clinic phone appointment on 10/06/2021 to recheck fluid levels.    91 day device clinic remote transmission 12/12/2021.      EP/Cardiology Next Office Visit:   10/28/2021 with Dr Caryl Comes.  10/03/2021 with Dr Rockey Situ.   Copy of ICM check sent to Dr. Caryl Comes and Dr Rockey Situ as Juluis Rainier for 9/29 OV.   3 month ICM trend: 09/29/2021.    12-14 Month ICM trend:     Rosalene Billings, RN 09/29/2021 12:25 PM

## 2021-09-30 ENCOUNTER — Telehealth: Payer: Self-pay

## 2021-09-30 NOTE — Telephone Encounter (Signed)
Remote ICM transmission received.  Attempted call to wife Tye [place] per DPR regarding ICM remote transmission and no answer.

## 2021-10-03 ENCOUNTER — Encounter: Payer: Self-pay | Admitting: Cardiovascular Disease

## 2021-10-03 ENCOUNTER — Ambulatory Visit: Payer: Medicare HMO | Attending: Cardiovascular Disease | Admitting: Cardiovascular Disease

## 2021-10-03 ENCOUNTER — Other Ambulatory Visit: Payer: Self-pay | Admitting: Internal Medicine

## 2021-10-03 ENCOUNTER — Ambulatory Visit
Admission: RE | Admit: 2021-10-03 | Discharge: 2021-10-03 | Disposition: A | Discharge status: Home / Self Care | Payer: Medicare HMO | Source: Ambulatory Visit | Attending: Internal Medicine | Admitting: Internal Medicine

## 2021-10-03 VITALS — BP 130/58 | HR 65 | Ht 69.0 in | Wt 231.4 lb

## 2021-10-03 DIAGNOSIS — R0602 Shortness of breath: Secondary | ICD-10-CM

## 2021-10-03 DIAGNOSIS — E785 Hyperlipidemia, unspecified: Secondary | ICD-10-CM | POA: Diagnosis not present

## 2021-10-03 DIAGNOSIS — I5022 Chronic systolic (congestive) heart failure: Secondary | ICD-10-CM

## 2021-10-03 DIAGNOSIS — I255 Ischemic cardiomyopathy: Secondary | ICD-10-CM | POA: Diagnosis not present

## 2021-10-03 DIAGNOSIS — E1169 Type 2 diabetes mellitus with other specified complication: Secondary | ICD-10-CM

## 2021-10-03 DIAGNOSIS — I10 Essential (primary) hypertension: Secondary | ICD-10-CM

## 2021-10-03 MED ORDER — NITROGLYCERIN 0.4 MG SL SUBL
SUBLINGUAL_TABLET | SUBLINGUAL | 3 refills | Status: AC
Start: 2021-10-03 — End: ?

## 2021-10-03 NOTE — Progress Notes (Signed)
Date:  10/03/2021   ID:  [name], DOB 05-Aug-1950, MRN [id number]  Patient Location:  [place] [address]   Provider location:   [facility], [place] office  PCP:  Verita Lamb, NP  Cardiologist:  Arvid Right [facility]  Chief Complaint  Patient presents with   6 month follow up     Patient c/o shortness of breath with little to no exertion. Medications reviewed by the patient verbally.     History of Present Illness:    Adam Douglas is a 71 y.o. male past medical history of diabetes,  Hypertension, hyperlipidemia,  Former smoker, >30 years ago, smoked for 20 yrs  ocular strokes,  Admission 03/03/2017  for non-STEMI with finding of left bundle branch block,  severe multivessel CAD and an ischemic cardiomyopathy with an EF of 20-25% ICD Ejection fraction 50% on echocardiogram September 2022 Who presents for follow up of his cardiomyopathy and CAD  Last seen in clinic by myself November 2021 Seen by one of our providers May 2023 Episode of angioedema felt to be secondary to [facility], stopped March 2023 Was seen in the emergency room April 14 2021 for tongue swelling lip swelling Was treated with steroids, antihistamines Back to the emergency room April 27 for motor symptoms No sx recently  Reports having chronic shortness of breath No regular exercise program, weight trending higher Poor diet  Pacer download reviewed, predominantly ventricularly paced Remains on Lasix 40 daily  Labs reviewed A1c 6.6 up to 7.2 Total chol 88, LDL 31  EKG personally reviewed by myself on todays visit Paced rhythm rate 65 bpm, underlying sinus  Other past medical history reviewed echocardiogram March 2020 Ejection fraction 30 to 35%, mild LVH, moderately dilated LV size, mild to moderate MR  EF 20 in 2019   30-35% March 2020  Hemoglobin A1c 7.1 down from 7.9  EKG personally reviewed by myself on todays visit Shows normal sinus  rhythm rate 72 bpm PACs bundle branch block  Other past medical history reviewed  diagnostic catheterization 03/03/2017 revealing moderate to severe diffuse, small vessel coronary artery disease with an EF of less than 20% by ventriculography.   Moderate to severe mid and distal LAD disease severe disease of small diagonal vessels, Severe disease of small OM1 and om2 vessels Medical management recommended   Past Medical History:  Diagnosis Date   Angioedema    a. 04/2021 - presumed to be 2/2 entresto; b. 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   CAD (coronary artery disease)    a. 02/2017 NSTEMI/Cath: LM 40d, LAD 35m, 66m/d, D2 small w/ sev prox/m dzs, LCX nl, OM1 sev diff dzs - small vessel, OM2 80-90p/m, RCA dominant 56m, RPDA 50, EF <20%-->Med Rx.   CHF (congestive heart failure) ([employer])    Diabetes mellitus without complication ([place])    HFimpEF (heart failure with improved ejection fraction) ([place])    a. 02/2017 Echo: EF 20-25%; b. 03/2018 Echo: EF 30-35%, mild conc LVH. Impaired relaxation. Nl RV fxn. Mild BAE. Mild to mod MR. Mild Ao root dil; c. 09/2020 Echo: EF 50%, glob HK, mod LVH. GrI DD. Nl RV size/fxn. Mod dil LA. Mild MR.   Hyperlipidemia    Hypertension    Ischemic cardiomyopathy    a. 02/2017 Echo: EF 20-25%; b. 03/2018 Echo: EF 30-35%; c. 03/2018 s/p MDT [id number] [facility] MRI CRT-D; d. 09/2020 Echo: EF 50%.   LBBB (left bundle branch block)    a.  Noted 02/2017. Pt denies any known prior history of LBBB (not present on 2014 [facility] ECG interpretation).   Morbid obesity ([place])    Pacemaker    Stroke [facility])    a. Ocular strokes x 3 - prev eval in [facility] for first 2, [facility] for last one.   Past Surgical History:  Procedure Laterality Date   BIV ICD INSERTION CRT-D N/A 03/18/2018   Procedure: BIV ICD INSERTION CRT-D;  Surgeon: Deboraha Sprang, MD;  Location: [place] CV LAB;  Service: Cardiovascular;  Laterality: N/A;   CERVICAL DISC SURGERY     CHOLECYSTECTOMY     LEFT HEART  CATH AND CORONARY ANGIOGRAPHY N/A 03/03/2017   Procedure: LEFT HEART CATH AND CORONARY ANGIOGRAPHY;  Surgeon: Minna Merritts, MD;  Location: [place] CV LAB;  Service: Cardiovascular;  Laterality: N/A;      Allergies:   Sacubitril-valsartan   Social History   Tobacco Use   Smoking status: Former    Packs/day: 1.00    Years: 15.00    Total pack years: 15.00    Types: Cigarettes   Smokeless tobacco: Never   Tobacco comments:    Quit in his 45's.  Vaping Use   Vaping Use: Never used  Substance Use Topics   Alcohol use: No   Drug use: No     Current Outpatient Medications on File Prior to Visit  Medication Sig Dispense Refill   acetaminophen (TYLENOL) 650 MG CR tablet Take 1,300 mg by mouth 2 (two) times daily.     amLODipine (NORVASC) 10 MG tablet Take 1 tablet (10 mg total) by mouth daily. 90 tablet 1   aspirin EC 81 MG tablet Take 1 tablet (81 mg total) by mouth daily. 90 tablet 3   atorvastatin (LIPITOR) 80 MG tablet Take 80 mg by mouth daily.     carvedilol (COREG) 12.5 MG tablet Take 1 tablet by mouth twice daily 180 tablet 0   citalopram (CELEXA) 40 MG tablet Take 40 mg by mouth daily.     furosemide (LASIX) 40 MG tablet Take one tablet (40mg ) by mouth once daily, you may take one additional tablet (40mg ) once daily as needed for swelling or increase shortness of breath. 135 tablet 3   insulin aspart (NOVOLOG) 100 UNIT/ML injection Inject 15 Units into the skin 3 (three) times daily with meals.     insulin detemir (LEVEMIR) 100 UNIT/ML injection Inject 50 Units into the skin 2 (two) times daily.     metFORMIN (GLUCOPHAGE-XR) 500 MG 24 hr tablet Take 500 mg by mouth 2 (two) times daily.     nitroGLYCERIN (NITROSTAT) 0.4 MG SL tablet PLACE 1 TABLET UNDER THE TONGUE EVERY 5 MINUTES AS NEEDED 25 tablet 0   pantoprazole (PROTONIX) 20 MG tablet Take 1 tablet by mouth daily.     spironolactone (ALDACTONE) 25 MG tablet Take 1/2 (one-half) tablet by mouth once daily 45 tablet  0   traMADol (ULTRAM) 50 MG tablet Take 50-100 mg by mouth every 8 (eight) hours as needed for pain.     warfarin (COUMADIN) 5 MG tablet TAKE 1 TO 2 TABLETS BY MOUTH ONCE DAILY OR AS  DIRECTED  BY  THE  COUMADIN  CLINIC. 90 tablet 1   cyclobenzaprine (FLEXERIL) 10 MG tablet Take 10 mg by mouth 3 (three) times daily as needed for muscle spasms. (Patient not taking: Reported on 10/03/2021)     EPINEPHrine 0.3 mg/0.3 mL IJ SOAJ injection Inject 0.3 mg into the muscle as  needed for up to 1 dose for anaphylaxis. (Patient not taking: Reported on 10/03/2021) 2 each 0   predniSONE (DELTASONE) 10 MG tablet 4 tabs po day1,2; 3 tabs po day3,4; 2 tabs po day 5,6; 1 tab po day day 7,8; 1/2 tab po day9,10,11,12 (Patient not taking: Reported on 10/03/2021) 22 tablet 0   No current facility-administered medications on file prior to visit.     Family Hx: The patient's family history includes CAD in his sister; CAD (age of onset: 52) in his father; CAD (age of onset: 35) in his mother; Heart attack in his father and mother.  ROS:   Please see the history of present illness.    Review of Systems  Constitutional: Negative.   HENT: Negative.    Respiratory: Negative.    Cardiovascular: Negative.   Gastrointestinal: Negative.   Musculoskeletal: Negative.   Neurological: Negative.   Psychiatric/Behavioral: Negative.    All other systems reviewed and are negative.    Labs/Other Tests and Data Reviewed:    Recent Labs: 05/01/2021: B Natriuretic Peptide 139.2 05/17/2021: ALT 22; BUN 19; Creatinine, Ser 1.18; Hemoglobin 13.0; Platelets 304; Potassium 3.9; Sodium 135   Recent Lipid Panel Lab Results  Component Value Date/Time   CHOL 94 03/04/2017 08:15 PM   TRIG 117 03/04/2017 08:15 PM   HDL 35 (L) 03/04/2017 08:15 PM   CHOLHDL 2.7 03/04/2017 08:15 PM   LDLCALC 36 03/04/2017 08:15 PM    Wt Readings from Last 3 Encounters:  10/03/21 231 lb 6 oz (105 kg)  05/17/21 232 lb (105.2 kg)  05/08/21 232 lb (105.2  kg)     Exam:    Vital Signs: Vital signs may also be detailed in the HPI BP (!) 130/58 (BP Location: Left Arm, Patient Position: Sitting, Cuff Size: Normal)   Pulse 65   Ht 5\' 9"  (1.753 m)   Wt 231 lb 6 oz (105 kg)   SpO2 94%   BMI 34.17 kg/m   Constitutional:  oriented to person, place, and time. No distress.  HENT:  Head: Grossly normal Eyes:  no discharge. No scleral icterus.  Neck: No JVD, no carotid bruits  Cardiovascular: Regular rate and rhythm, no murmurs appreciated Pulmonary/Chest: Clear to auscultation bilaterally, no wheezes or rails Abdominal: Soft.  no distension.  no tenderness.  Musculoskeletal: Normal range of motion Neurological:  normal muscle tone. Coordination normal. No atrophy Skin: Skin warm and dry Psychiatric: normal affect, pleasant  ASSESSMENT & PLAN:    Problem List Items Addressed This Visit       Cardiology Problems   Essential hypertension   Relevant Orders   EKG 12-Lead   Type 2 diabetes mellitus with hyperlipidemia (HCC)   Relevant Orders   EKG [id number]   Chronic systolic heart failure ([place]) - Primary   Relevant Orders   EKG 12-Lead   Ischemic cardiomyopathy   Ischemic cardiomyopathy Ejection fraction improved up to 50 to 55% on echo September 2022 We recommended he continue Entresto, carvedilol, spironolactone, Lasix He is declining cardiac rehab  Cad with chronic stable angina Stressed importance of weight loss, walking program, limiting his carbohydrate intake -We did discuss if shortness of breath gets worse we can order stress testing He prefers to work on his conditioning first  Diabetes type 2 poorly controlled complications [id number] stable 7.2 We have encouraged continued exercise, careful diet management in an effort to lose weight.  Hyperlipidemia Cholesterol is at goal on the current lipid regimen. No changes to the medications were made.  Total encounter time more than 30 minutes  Greater than 50% was spent in  counseling and coordination of care with the patient    Signed, Ida Rogue, [place] [address], [place], [place] [address]

## 2021-10-03 NOTE — Patient Instructions (Addendum)
Medication Instructions:  No changes  If you need a refill on your cardiac medications before your next appointment, please call your pharmacy.   Lab work: No new labs needed  Testing/Procedures: No new testing needed  Follow-Up: At [facility], you and your health needs are our priority.  As part of our continuing mission to provide you with exceptional heart care, we have created designated Provider Care Teams.  These Care Teams include your primary Cardiologist (physician) and Advanced Practice Providers (APPs -  Physician Assistants and Nurse Practitioners) who all work together to provide you with the care you need, when you need it.  You will need a follow up appointment in 12 months  Providers on your designated Care Team:   Christopher Berge, NP Ryan Dunn, PA-C Cadence Furth, PA-C  COVID-19 Vaccine Information can be found at: [email or web address] For questions related to vaccine distribution or appointments, please email [email or web address] or call [phone].   

## 2021-10-06 ENCOUNTER — Ambulatory Visit (INDEPENDENT_AMBULATORY_CARE_PROVIDER_SITE_OTHER): Payer: Medicare HMO

## 2021-10-06 DIAGNOSIS — I5022 Chronic systolic (congestive) heart failure: Secondary | ICD-10-CM

## 2021-10-06 DIAGNOSIS — Z95810 Presence of automatic (implantable) cardiac defibrillator: Secondary | ICD-10-CM

## 2021-10-07 NOTE — Progress Notes (Signed)
EPIC Encounter for ICM Monitoring  Patient Name: Adam Douglas is a 71 y.o. male Date: 10/07/2021 Primary Care Physican: Verita Lamb, NP Primary Cardiologist: Rockey Situ Electrophysiologist: Vergie Living Pacing: 98.3%         05/08/2021 Office Weight: 232 lbs                                                             Transmission reviewed.     Optivol thoracic impedance suggesting fluid levels returned to normal.     Prescribed:   Furosemide 40 mg Take one tablet (40mg ) by mouth once daily, you may take one additional tablet (40mg ) once daily as needed for swelling or increase shortness of breath. Spironolactone 25 mg take 0.5 tablet (12.5 mg total) daily   Labs: 05/26/2021 Creatinine 0.99, BUN 24, Potassium 4.3, Sodium 135, GFR 81 05/17/2021 Creatinine 1.18, BUN 19, Potassium 3.9, Sodium 135, GFR >60  05/08/2021 Creatinine 1.21, BUN 21, Potassium 3.8, Sodium 138, GFR >60  05/02/2021 Creatinine 1.32, BUN 24, Potassium 3.9, Sodium 133, GFR 58  05/01/2021 Creatinine 1.03, BUN 17, Potassium 3.6, Sodium 136, GFR >60 04/14/2021 Creatinine 0.88, BUN 16, Potassium 2.7, Sodium 139, GFR >60  02/24/2021 Creatinine 0.97, BUN 20, Potassium 4.1, Sodium 138, GFR 83 A complete set of results can be found in Results Review.   Recommendations:  Unable to reach.     Follow-up plan: ICM clinic phone appointment on 11/03/2021.    91 day device clinic remote transmission 12/12/2021.      EP/Cardiology Next Office Visit:   10/28/2021 with Dr Caryl Comes.  Recall 10/03/2022 with Dr Rockey Situ.   Copy of ICM check sent to Dr. Caryl Comes.   3 month ICM trend: 10/06/2021.    12-14 Month ICM trend:     Rosalene Billings, RN 10/07/2021 2:00 PM

## 2021-10-08 ENCOUNTER — Ambulatory Visit: Payer: Medicare HMO | Attending: Cardiovascular Disease

## 2021-10-08 DIAGNOSIS — I480 Paroxysmal atrial fibrillation: Secondary | ICD-10-CM

## 2021-10-08 DIAGNOSIS — Z5181 Encounter for therapeutic drug level monitoring: Secondary | ICD-10-CM

## 2021-10-08 DIAGNOSIS — Z8673 Personal history of transient ischemic attack (TIA), and cerebral infarction without residual deficits: Secondary | ICD-10-CM | POA: Diagnosis not present

## 2021-10-08 LAB — POCT INR: INR: 3.6 — AB (ref 2.0–3.0)

## 2021-10-08 NOTE — Patient Instructions (Signed)
-   HOLD THURSDAY ONLY and then CONTINUE 1 tablet (5 mg) every day. - recheck in 4 weeks

## 2021-10-28 ENCOUNTER — Ambulatory Visit: Payer: Medicare HMO | Attending: Internal Medicine | Admitting: Internal Medicine

## 2021-10-28 NOTE — Progress Notes (Deleted)
Patient Care Team: Etheleen Nicks, NP as PCP - General Mariah Milling Tollie Pizza, MD as PCP - Cardiology (Cardiology)   HPI  Adam Douglas is a 71 y.o. male seen in follow-up CRT-ICD Med[employer] implanted 3/20; last seen by me telehealth 3/21  History of ischemic heart disease with prior non-STEMI three-vessel coronary disease treated medically.  He has a combined nonischemic/ischemic cardiomyopathy.  Prior strokes.  Previously on DAPT; at last visit changed to apixaban no interval bleeding , however he has not been taking it for the last month because of cost.  Now on warfarin  The patient denies chest pain***, shortness of breath***, nocturnal dyspnea***, orthopnea*** or peripheral edema***.  There have been no palpitations***, lightheadedness*** or syncope***.  Complains of ***.    DATE TEST EF    2/19 LHC  <20 % Severe 3V CAD>> med Rx  2/19 Echo   20-25 %    7/19 Echo 30-35%    10/19 Echo  30-35%   3/20 Echo  30-35%   3/22 Echo  50-55%     Date Cr K Hgb  3/19 1.26 4.9 11.5  6/19   11.9   10/19 1.10 4.7    5/23 1.18 3.9 13.0     Thromboembolic risk factors ( age -86 , HTN-1, TIA/CVA-2, DM-1, Vasc disease -1, CHF-1) for a CHADSVASc Score of >/=7     Past Medical History:  Diagnosis Date   Angioedema    a. 04/2021 - presumed to be 2/2 entresto; b. 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   CAD (coronary artery disease)    a. 02/2017 NSTEMI/Cath: LM 40d, LAD 65m, 76m/d, D2 small w/ sev prox/m dzs, LCX nl, OM1 sev diff dzs - small vessel, OM2 80-90p/m, RCA dominant 79m, RPDA 50, EF <20%-->Med Rx.   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 02/2017 Echo: EF 20-25%; b. 03/2018 Echo: EF 30-35%, mild conc LVH. Impaired relaxation. Nl RV fxn. Mild BAE. Mild to mod MR. Mild Ao root dil; c. 09/2020 Echo: EF 50%, glob HK, mod LVH. GrI DD. Nl RV size/fxn. Mod dil LA.  Mild MR.   Hyperlipidemia    Hypertension    Ischemic cardiomyopathy    a. 02/2017 Echo: EF 20-25%; b. 03/2018 Echo: EF 30-35%; c. 03/2018 s/p MDT [id number] [facility] MRI CRT-D; d. 09/2020 Echo: EF 50%.   LBBB (left bundle branch block)    a. Noted 02/2017. Pt denies any known prior history of LBBB (not present on 2014 [facility] ECG interpretation).   Morbid obesity (HCC)    Pacemaker    Stroke [facility])    a. Ocular strokes x 3 - prev eval in [facility] for first 2, [facility] for last one.    Past Surgical History:  Procedure Laterality Date   BIV ICD INSERTION CRT-D N/A 03/18/2018   Procedure: BIV ICD INSERTION CRT-D;  Surgeon: Duke Salvia, MD;  Location: [facility] INVASIVE CV LAB;  Service: Cardiovascular;  Laterality: N/A;   CERVICAL DISC SURGERY     CHOLECYSTECTOMY     LEFT HEART CATH AND CORONARY ANGIOGRAPHY N/A 03/03/2017   Procedure: LEFT HEART CATH AND CORONARY ANGIOGRAPHY;  Surgeon: Antonieta Iba, MD;  Location: [facility] INVASIVE CV LAB;  Service: Cardiovascular;  Laterality: N/A;    No outpatient medications have been marked as taking for the 10/28/21 encounter (Appointment) with Duke Salvia, MD.  Allergies  Allergen Reactions   Sacubitril-Valsartan Swelling    Of tongue Of tongue Entresto      Review of Systems negative except from HPI and PMH  Physical Exam There were no vitals taken for this visit. Well developed and well nourished in no acute distress HENT normal Neck supple with JVP-flat Clear Device pocket well healed; without hematoma or erythema.  There is no tethering  Regular rate and rhythm, no *** gallop No ***/*** murmur Abd-soft with active BS No Clubbing cyanosis *** edema Skin-warm and dry A & Oriented  Grossly normal sensory and motor function  ECG ***     Assessment and Plan:  Cardiomyopathy ischemic/nonischemic  Congestive heart failure class 2b-3a  Left bundle branch block  Hypertension    Strokes x3  Atrial fibrillation  Anemia   Memory  deficits following stroke

## 2021-10-29 ENCOUNTER — Encounter: Payer: Self-pay | Admitting: Internal Medicine

## 2021-11-03 ENCOUNTER — Ambulatory Visit (INDEPENDENT_AMBULATORY_CARE_PROVIDER_SITE_OTHER): Payer: Medicare HMO

## 2021-11-03 DIAGNOSIS — I5022 Chronic systolic (congestive) heart failure: Secondary | ICD-10-CM | POA: Diagnosis not present

## 2021-11-03 DIAGNOSIS — Z95810 Presence of automatic (implantable) cardiac defibrillator: Secondary | ICD-10-CM | POA: Diagnosis not present

## 2021-11-04 NOTE — Progress Notes (Signed)
EPIC Encounter for ICM Monitoring  Patient Name: Adam Douglas is a 71 y.o. male Date: 11/04/2021 Primary Care Physican: Verita Lamb, NP Primary Cardiologist: Rockey Situ Electrophysiologist: Vergie Living Pacing: 98.2%         10/03/2021 Office Weight: 231 lbs                                                             Transmission reviewed.     Optivol thoracic impedance trending close to normal and suggesting possible intermittent days with fluid accumulation within the last month.     Prescribed:   Furosemide 40 mg Take one tablet (40mg ) by mouth once daily, you may take one additional tablet (40mg ) once daily as needed for swelling or increase shortness of breath. Spironolactone 25 mg take 0.5 tablet (12.5 mg total) daily   Labs: 05/26/2021 Creatinine 0.99, BUN 24, Potassium 4.3, Sodium 135, GFR 81 05/17/2021 Creatinine 1.18, BUN 19, Potassium 3.9, Sodium 135, GFR >60  05/08/2021 Creatinine 1.21, BUN 21, Potassium 3.8, Sodium 138, GFR >60  05/02/2021 Creatinine 1.32, BUN 24, Potassium 3.9, Sodium 133, GFR 58  05/01/2021 Creatinine 1.03, BUN 17, Potassium 3.6, Sodium 136, GFR >60 04/14/2021 Creatinine 0.88, BUN 16, Potassium 2.7, Sodium 139, GFR >60  02/24/2021 Creatinine 0.97, BUN 20, Potassium 4.1, Sodium 138, GFR 83 A complete set of results can be found in Results Review.   Recommendations:  No changes.    Follow-up plan: ICM clinic phone appointment on 12/08/2021.    91 day device clinic remote transmission 12/12/2021.      EP/Cardiology Next Office Visit:  Missed 10/28/2021 with Dr Caryl Comes and not rescheduled.  Recall 10/03/2022 with Dr Rockey Situ.   Copy of ICM check sent to Dr. Caryl Comes.   3 month ICM trend: 11/03/2021.    12-14 Month ICM trend:     Rosalene Billings, RN 11/04/2021 9:26 AM

## 2021-11-05 ENCOUNTER — Ambulatory Visit: Payer: Medicare HMO | Attending: Cardiovascular Disease

## 2021-11-05 ENCOUNTER — Ambulatory Visit: Payer: Medicare HMO

## 2021-11-05 DIAGNOSIS — I480 Paroxysmal atrial fibrillation: Secondary | ICD-10-CM

## 2021-11-05 DIAGNOSIS — Z8673 Personal history of transient ischemic attack (TIA), and cerebral infarction without residual deficits: Secondary | ICD-10-CM

## 2021-11-05 DIAGNOSIS — Z5181 Encounter for therapeutic drug level monitoring: Secondary | ICD-10-CM

## 2021-11-05 LAB — POCT INR: INR: 3.7 — AB (ref 2.0–3.0)

## 2021-11-05 NOTE — Patient Instructions (Signed)
-   HOLD TODAY ONLY and then CONTINUE 1 tablet (5 mg) every day. - recheck in 4 weeks

## 2021-11-06 ENCOUNTER — Encounter: Payer: Medicare HMO | Admitting: Internal Medicine

## 2021-11-21 ENCOUNTER — Other Ambulatory Visit: Payer: Self-pay | Admitting: Cardiovascular Disease

## 2021-11-21 DIAGNOSIS — I255 Ischemic cardiomyopathy: Secondary | ICD-10-CM

## 2021-11-21 DIAGNOSIS — I5022 Chronic systolic (congestive) heart failure: Secondary | ICD-10-CM

## 2021-12-03 ENCOUNTER — Telehealth: Payer: Self-pay

## 2021-12-03 ENCOUNTER — Ambulatory Visit: Payer: Medicare HMO | Attending: Cardiovascular Disease

## 2021-12-03 NOTE — Telephone Encounter (Signed)
Lpmtcb and reschedule INR check 

## 2021-12-04 ENCOUNTER — Other Ambulatory Visit: Payer: Self-pay | Admitting: Internal Medicine

## 2021-12-04 ENCOUNTER — Ambulatory Visit
Admission: RE | Admit: 2021-12-04 | Discharge: 2021-12-04 | Disposition: A | Discharge status: Home / Self Care | Payer: Medicare HMO | Source: Ambulatory Visit | Attending: Internal Medicine | Admitting: Internal Medicine

## 2021-12-04 DIAGNOSIS — M5442 Lumbago with sciatica, left side: Secondary | ICD-10-CM

## 2021-12-08 ENCOUNTER — Ambulatory Visit (INDEPENDENT_AMBULATORY_CARE_PROVIDER_SITE_OTHER): Payer: Medicare HMO

## 2021-12-08 DIAGNOSIS — I5022 Chronic systolic (congestive) heart failure: Secondary | ICD-10-CM | POA: Diagnosis not present

## 2021-12-08 DIAGNOSIS — Z95810 Presence of automatic (implantable) cardiac defibrillator: Secondary | ICD-10-CM

## 2021-12-10 ENCOUNTER — Ambulatory Visit: Payer: Medicare HMO | Attending: Cardiovascular Disease

## 2021-12-10 DIAGNOSIS — Z5181 Encounter for therapeutic drug level monitoring: Secondary | ICD-10-CM

## 2021-12-10 DIAGNOSIS — Z8673 Personal history of transient ischemic attack (TIA), and cerebral infarction without residual deficits: Secondary | ICD-10-CM | POA: Diagnosis not present

## 2021-12-10 DIAGNOSIS — I480 Paroxysmal atrial fibrillation: Secondary | ICD-10-CM | POA: Diagnosis not present

## 2021-12-10 LAB — POCT INR: INR: 1.8 — AB (ref 2.0–3.0)

## 2021-12-10 NOTE — Patient Instructions (Signed)
-  TAKE ANOTHER 0.5 TABLET TONIGHT THEN CONTINUE 1 tablet (5 mg) every day. - recheck in 5 weeks

## 2021-12-12 ENCOUNTER — Ambulatory Visit (INDEPENDENT_AMBULATORY_CARE_PROVIDER_SITE_OTHER): Payer: Medicare HMO

## 2021-12-12 DIAGNOSIS — I255 Ischemic cardiomyopathy: Secondary | ICD-10-CM | POA: Diagnosis not present

## 2021-12-12 LAB — CUP PACEART REMOTE DEVICE CHECK
Battery Remaining Longevity: 65 mo
Battery Voltage: 2.98 V
Brady Statistic AP VP Percent: 32.7 %
Brady Statistic AP VS Percent: 0.55 %
Brady Statistic AS VP Percent: 65.74 %
Brady Statistic AS VS Percent: 1 %
Brady Statistic RA Percent Paced: 33.25 %
Brady Statistic RV Percent Paced: 22.55 %
Date Time Interrogation Session: 20231208022824
HighPow Impedance: 69 Ohm
Implantable Lead Connection Status: 753985
Implantable Lead Connection Status: 753985
Implantable Lead Connection Status: 753985
Implantable Lead Implant Date: 20200313
Implantable Lead Implant Date: 20200313
Implantable Lead Implant Date: 20200313
Implantable Lead Location: 753858
Implantable Lead Location: 753859
Implantable Lead Location: 753860
Implantable Lead Model: 4396
Implantable Lead Model: 5076
Implantable Lead Model: 6935
Implantable Pulse Generator Implant Date: 20200313
Lead Channel Impedance Value: 247 Ohm
Lead Channel Impedance Value: 304 Ohm
Lead Channel Impedance Value: 342 Ohm
Lead Channel Impedance Value: 456 Ohm
Lead Channel Impedance Value: 475 Ohm
Lead Channel Impedance Value: 760 Ohm
Lead Channel Pacing Threshold Amplitude: 0.625 V
Lead Channel Pacing Threshold Amplitude: 0.875 V
Lead Channel Pacing Threshold Amplitude: 1 V
Lead Channel Pacing Threshold Pulse Width: 0.4 ms
Lead Channel Pacing Threshold Pulse Width: 0.4 ms
Lead Channel Pacing Threshold Pulse Width: 0.4 ms
Lead Channel Sensing Intrinsic Amplitude: 3 mV
Lead Channel Sensing Intrinsic Amplitude: 3 mV
Lead Channel Sensing Intrinsic Amplitude: 6.5 mV
Lead Channel Sensing Intrinsic Amplitude: 6.5 mV
Lead Channel Setting Pacing Amplitude: 1.5 V
Lead Channel Setting Pacing Amplitude: 2 V
Lead Channel Setting Pacing Amplitude: 2 V
Lead Channel Setting Pacing Pulse Width: 0.4 ms
Lead Channel Setting Pacing Pulse Width: 0.4 ms
Lead Channel Setting Sensing Sensitivity: 0.3 mV
Zone Setting Status: 755011
Zone Setting Status: 755011

## 2021-12-12 NOTE — Progress Notes (Signed)
EPIC Encounter for ICM Monitoring  Patient Name: Adam Douglas is a 71 y.o. male Date: 12/12/2021 Primary Care Physican: Etheleen Nicks, NP Primary Cardiologist: Mariah Milling Electrophysiologist: Joycelyn Schmid Pacing: 98.3%         10/03/2021 Office Weight: 231 lbs                                                             Transmission reviewed.     Optivol thoracic impedance normal but was suggesting possible intermittent days with fluid accumulation within the last month.     Prescribed:   Furosemide 40 mg Take one tablet (40mg ) by mouth once daily, you may take one additional tablet (40mg ) once daily as needed for swelling or increase shortness of breath. Spironolactone 25 mg take 0.5 tablet (12.5 mg total) daily   Labs: 05/26/2021 Creatinine 0.99, BUN 24, Potassium 4.3, Sodium 135, GFR 81 05/17/2021 Creatinine 1.18, BUN 19, Potassium 3.9, Sodium 135, GFR >60  05/08/2021 Creatinine 1.21, BUN 21, Potassium 3.8, Sodium 138, GFR >60  05/02/2021 Creatinine 1.32, BUN 24, Potassium 3.9, Sodium 133, GFR 58  05/01/2021 Creatinine 1.03, BUN 17, Potassium 3.6, Sodium 136, GFR >60 04/14/2021 Creatinine 0.88, BUN 16, Potassium 2.7, Sodium 139, GFR >60  02/24/2021 Creatinine 0.97, BUN 20, Potassium 4.1, Sodium 138, GFR 83 A complete set of results can be found in Results Review.   Recommendations:  No changes.    Follow-up plan: ICM clinic phone appointment on 01/19/2022.    91 day device clinic remote transmission 03/13/2022.      EP/Cardiology Next Office Visit:  01/02/2022 with Dr 05/13/2022.  Recall 10/03/2022 with Dr Graciela Husbands.   Copy of ICM check sent to Dr. 10/05/2022.   3 month ICM trend: 12/12/2021.    12-14 Month ICM trend:     Graciela Husbands, RN 12/12/2021 11:57 AM

## 2021-12-26 DIAGNOSIS — Z95810 Presence of automatic (implantable) cardiac defibrillator: Secondary | ICD-10-CM | POA: Insufficient documentation

## 2021-12-26 DIAGNOSIS — I509 Heart failure, unspecified: Secondary | ICD-10-CM | POA: Insufficient documentation

## 2021-12-26 DIAGNOSIS — I447 Left bundle-branch block, unspecified: Secondary | ICD-10-CM | POA: Insufficient documentation

## 2021-12-26 DIAGNOSIS — I4891 Unspecified atrial fibrillation: Secondary | ICD-10-CM | POA: Insufficient documentation

## 2022-01-02 ENCOUNTER — Ambulatory Visit: Payer: Medicare HMO | Attending: Internal Medicine | Admitting: Internal Medicine

## 2022-01-02 DIAGNOSIS — I447 Left bundle-branch block, unspecified: Secondary | ICD-10-CM

## 2022-01-02 DIAGNOSIS — Z95810 Presence of automatic (implantable) cardiac defibrillator: Secondary | ICD-10-CM

## 2022-01-02 DIAGNOSIS — I255 Ischemic cardiomyopathy: Secondary | ICD-10-CM

## 2022-01-02 DIAGNOSIS — I509 Heart failure, unspecified: Secondary | ICD-10-CM

## 2022-01-02 DIAGNOSIS — I4891 Unspecified atrial fibrillation: Secondary | ICD-10-CM

## 2022-01-02 NOTE — Progress Notes (Signed)
Remote ICD transmission.   

## 2022-01-08 ENCOUNTER — Other Ambulatory Visit: Payer: Self-pay | Admitting: Cardiovascular Disease

## 2022-01-08 NOTE — Telephone Encounter (Signed)
Refill request for warfarin:  Last INR was 1.8 on 12/10/21 Next INR due 01/14/22 LOV was 10/03/21  Johnny Bridge MD  Refill approved.

## 2022-01-14 ENCOUNTER — Ambulatory Visit: Payer: Medicare HMO

## 2022-01-19 ENCOUNTER — Ambulatory Visit (INDEPENDENT_AMBULATORY_CARE_PROVIDER_SITE_OTHER): Payer: Medicare HMO

## 2022-01-19 DIAGNOSIS — Z95810 Presence of automatic (implantable) cardiac defibrillator: Secondary | ICD-10-CM

## 2022-01-19 DIAGNOSIS — I5022 Chronic systolic (congestive) heart failure: Secondary | ICD-10-CM | POA: Diagnosis not present

## 2022-01-21 ENCOUNTER — Ambulatory Visit: Payer: Medicare HMO | Attending: Cardiovascular Disease

## 2022-01-21 DIAGNOSIS — Z8673 Personal history of transient ischemic attack (TIA), and cerebral infarction without residual deficits: Secondary | ICD-10-CM

## 2022-01-21 DIAGNOSIS — I480 Paroxysmal atrial fibrillation: Secondary | ICD-10-CM

## 2022-01-21 DIAGNOSIS — Z5181 Encounter for therapeutic drug level monitoring: Secondary | ICD-10-CM

## 2022-01-21 LAB — POCT INR: INR: 1.6 — AB (ref 2.0–3.0)

## 2022-01-21 NOTE — Patient Instructions (Signed)
-  TAKE ANOTHER 1 TABLET TONIGHT ONLY THEN CONTINUE 1 tablet (5 mg) every day. - recheck in 4 weeks

## 2022-01-22 NOTE — Progress Notes (Signed)
EPIC Encounter for ICM Monitoring  Patient Name: Adam Douglas is a 72 y.o. male Date: 01/22/2022 Primary Care Physican: Verita Lamb, NP Primary Cardiologist: Rockey Situ Electrophysiologist: Vergie Living Pacing: 98.2%         10/03/2021 Office Weight: 231 lbs                                                             Transmission reviewed.     Optivol thoracic impedance normal but was suggesting possible intermittent days with fluid accumulation within the last month.     Prescribed:   Furosemide 40 mg Take one tablet (40mg ) by mouth once daily, you may take one additional tablet (40mg ) once daily as needed for swelling or increase shortness of breath. Spironolactone 25 mg take 0.5 tablet (12.5 mg total) daily   Labs: 05/26/2021 Creatinine 0.99, BUN 24, Potassium 4.3, Sodium 135, GFR 81 05/17/2021 Creatinine 1.18, BUN 19, Potassium 3.9, Sodium 135, GFR >60  05/08/2021 Creatinine 1.21, BUN 21, Potassium 3.8, Sodium 138, GFR >60  05/02/2021 Creatinine 1.32, BUN 24, Potassium 3.9, Sodium 133, GFR 58  05/01/2021 Creatinine 1.03, BUN 17, Potassium 3.6, Sodium 136, GFR >60 04/14/2021 Creatinine 0.88, BUN 16, Potassium 2.7, Sodium 139, GFR >60  02/24/2021 Creatinine 0.97, BUN 20, Potassium 4.1, Sodium 138, GFR 83 A complete set of results can be found in Results Review.   Recommendations:  No changes.    Follow-up plan: ICM clinic phone appointment on 02/24/2022.    91 day device clinic remote transmission 03/13/2022.      EP/Cardiology Next Office Visit:  Missed 01/02/2022 with Dr Caryl Comes and not rescheduled.  Recall 10/03/2022 with Dr Rockey Situ.   Copy of ICM check sent to Dr. Caryl Comes.    3 month ICM trend: 01/19/2022.    12-14 Month ICM trend:     Rosalene Billings, RN 01/22/2022 8:04 AM

## 2022-02-18 ENCOUNTER — Ambulatory Visit: Payer: Medicare HMO | Attending: Cardiovascular Disease

## 2022-02-24 ENCOUNTER — Ambulatory Visit: Payer: Medicare HMO

## 2022-02-24 DIAGNOSIS — I5022 Chronic systolic (congestive) heart failure: Secondary | ICD-10-CM

## 2022-02-24 DIAGNOSIS — Z95810 Presence of automatic (implantable) cardiac defibrillator: Secondary | ICD-10-CM | POA: Diagnosis not present

## 2022-02-27 NOTE — Progress Notes (Signed)
EPIC Encounter for ICM Monitoring  Patient Name: Adam Douglas is a 72 y.o. male Date: 02/27/2022 Primary Care Physican: Verita Lamb, NP Primary Cardiologist: Rockey Situ Electrophysiologist: Vergie Living Pacing: 98.5%         10/03/2021 Office Weight: 231 lbs                                                             Transmission reviewed.     Optivol thoracic impedance normal but was suggesting possible intermittent days with fluid accumulation within the last month.     Prescribed:   Furosemide 40 mg Take one tablet ('40mg'$ ) by mouth once daily, you may take one additional tablet ('40mg'$ ) once daily as needed for swelling or increase shortness of breath. Spironolactone 25 mg take 0.5 tablet (12.5 mg total) daily   Labs: 05/26/2021 Creatinine 0.99, BUN 24, Potassium 4.3, Sodium 135, GFR 81 05/17/2021 Creatinine 1.18, BUN 19, Potassium 3.9, Sodium 135, GFR >60  05/08/2021 Creatinine 1.21, BUN 21, Potassium 3.8, Sodium 138, GFR >60  05/02/2021 Creatinine 1.32, BUN 24, Potassium 3.9, Sodium 133, GFR 58  05/01/2021 Creatinine 1.03, BUN 17, Potassium 3.6, Sodium 136, GFR >60 04/14/2021 Creatinine 0.88, BUN 16, Potassium 2.7, Sodium 139, GFR >60  02/24/2021 Creatinine 0.97, BUN 20, Potassium 4.1, Sodium 138, GFR 83 A complete set of results can be found in Results Review.   Recommendations:  No changes.    Follow-up plan: ICM clinic phone appointment on 03/30/2022.    91 day device clinic remote transmission 03/13/2022.      EP/Cardiology Next Office Visit:  Missed 01/02/2022 with Dr Caryl Comes and not rescheduled.  Recall 10/03/2022 with Dr Rockey Situ.   Copy of ICM check sent to Dr. Caryl Comes.   3 month ICM trend: 02/24/2022.    12-14 Month ICM trend:     Rosalene Billings, RN 02/27/2022 12:00 PM

## 2022-03-13 ENCOUNTER — Ambulatory Visit: Payer: Medicare HMO

## 2022-03-13 DIAGNOSIS — I214 Non-ST elevation (NSTEMI) myocardial infarction: Secondary | ICD-10-CM | POA: Diagnosis not present

## 2022-03-13 LAB — CUP PACEART REMOTE DEVICE CHECK
Battery Remaining Longevity: 59 mo
Battery Voltage: 2.98 V
Brady Statistic AP VP Percent: 45.81 %
Brady Statistic AP VS Percent: 0.49 %
Brady Statistic AS VP Percent: 52.95 %
Brady Statistic AS VS Percent: 0.75 %
Brady Statistic RA Percent Paced: 46.28 %
Brady Statistic RV Percent Paced: 61.82 %
Date Time Interrogation Session: 20240308033426
HighPow Impedance: 61 Ohm
Implantable Lead Connection Status: 753985
Implantable Lead Connection Status: 753985
Implantable Lead Connection Status: 753985
Implantable Lead Implant Date: 20200313
Implantable Lead Implant Date: 20200313
Implantable Lead Implant Date: 20200313
Implantable Lead Location: 753858
Implantable Lead Location: 753859
Implantable Lead Location: 753860
Implantable Lead Model: 4396
Implantable Lead Model: 5076
Implantable Lead Model: 6935
Implantable Pulse Generator Implant Date: 20200313
Lead Channel Impedance Value: 247 Ohm
Lead Channel Impedance Value: 304 Ohm
Lead Channel Impedance Value: 304 Ohm
Lead Channel Impedance Value: 418 Ohm
Lead Channel Impedance Value: 418 Ohm
Lead Channel Impedance Value: 722 Ohm
Lead Channel Pacing Threshold Amplitude: 0.5 V
Lead Channel Pacing Threshold Amplitude: 1 V
Lead Channel Pacing Threshold Amplitude: 1.125 V
Lead Channel Pacing Threshold Pulse Width: 0.4 ms
Lead Channel Pacing Threshold Pulse Width: 0.4 ms
Lead Channel Pacing Threshold Pulse Width: 0.4 ms
Lead Channel Sensing Intrinsic Amplitude: 1.125 mV
Lead Channel Sensing Intrinsic Amplitude: 1.125 mV
Lead Channel Sensing Intrinsic Amplitude: 8.125 mV
Lead Channel Sensing Intrinsic Amplitude: 8.125 mV
Lead Channel Setting Pacing Amplitude: 1.5 V
Lead Channel Setting Pacing Amplitude: 1.75 V
Lead Channel Setting Pacing Amplitude: 2 V
Lead Channel Setting Pacing Pulse Width: 0.4 ms
Lead Channel Setting Pacing Pulse Width: 0.4 ms
Lead Channel Setting Sensing Sensitivity: 0.3 mV
Zone Setting Status: 755011
Zone Setting Status: 755011

## 2022-03-30 ENCOUNTER — Ambulatory Visit: Payer: Medicare HMO | Attending: Internal Medicine

## 2022-03-30 DIAGNOSIS — Z95810 Presence of automatic (implantable) cardiac defibrillator: Secondary | ICD-10-CM | POA: Diagnosis not present

## 2022-03-30 DIAGNOSIS — I5022 Chronic systolic (congestive) heart failure: Secondary | ICD-10-CM | POA: Diagnosis not present

## 2022-04-03 NOTE — Progress Notes (Signed)
EPIC Encounter for ICM Monitoring  Patient Name: Adam Douglas is a 72 y.o. male Date: 04/03/2022 Primary Care Physican: Verita Lamb, NP Primary Cardiologist: Rockey Situ Electrophysiologist: Vergie Living Pacing: 98.4%         10/03/2021 Office Weight: 231 lbs                                                             Transmission reviewed.     Optivol thoracic impedance normal but was suggesting possible intermittent days with fluid accumulation within the last month.     Prescribed:   Furosemide 40 mg Take one tablet (40mg ) by mouth once daily, you may take one additional tablet (40mg ) once daily as needed for swelling or increase shortness of breath. Spironolactone 25 mg take 0.5 tablet (12.5 mg total) daily   Labs: 05/26/2021 Creatinine 0.99, BUN 24, Potassium 4.3, Sodium 135, GFR 81 05/17/2021 Creatinine 1.18, BUN 19, Potassium 3.9, Sodium 135, GFR >60  05/08/2021 Creatinine 1.21, BUN 21, Potassium 3.8, Sodium 138, GFR >60  05/02/2021 Creatinine 1.32, BUN 24, Potassium 3.9, Sodium 133, GFR 58  05/01/2021 Creatinine 1.03, BUN 17, Potassium 3.6, Sodium 136, GFR >60 04/14/2021 Creatinine 0.88, BUN 16, Potassium 2.7, Sodium 139, GFR >60  02/24/2021 Creatinine 0.97, BUN 20, Potassium 4.1, Sodium 138, GFR 83 A complete set of results can be found in Results Review.   Recommendations:  No changes.    Follow-up plan: ICM clinic phone appointment on 05/04/2022.    91 day device clinic remote transmission 06/12/2022.      EP/Cardiology Next Office Visit:  Missed 01/02/2022 with Dr Caryl Comes and not rescheduled.  Recall 10/03/2022 with Dr Rockey Situ.   Copy of ICM check sent to Dr. Caryl Comes.   3 month ICM trend: 03/30/2022.    12-14 Month ICM trend:     Rosalene Billings, RN 04/03/2022 10:23 AM

## 2022-04-13 NOTE — Progress Notes (Signed)
Remote ICD transmission.   

## 2022-05-04 ENCOUNTER — Ambulatory Visit: Payer: Medicare HMO | Attending: Internal Medicine

## 2022-05-04 DIAGNOSIS — I5022 Chronic systolic (congestive) heart failure: Secondary | ICD-10-CM | POA: Diagnosis not present

## 2022-05-04 DIAGNOSIS — Z95810 Presence of automatic (implantable) cardiac defibrillator: Secondary | ICD-10-CM

## 2022-05-05 NOTE — Progress Notes (Signed)
EPIC Encounter for ICM Monitoring  Patient Name: Adam Douglas is a 72 y.o. male Date: 05/05/2022 Primary Care Physican: Etheleen Nicks, NP Primary Cardiologist: Mariah Milling Electrophysiologist: Joycelyn Schmid Pacing: 98.5%         10/03/2021 Office Weight: 231 lbs                                                             Transmission reviewed.     Optivol thoracic impedance normal but was suggesting possible intermittent days with fluid accumulation within the last month.     Prescribed:   Furosemide 40 mg Take one tablet (40mg ) by mouth once daily, you may take one additional tablet (40mg ) once daily as needed for swelling or increase shortness of breath. Spironolactone 25 mg take 0.5 tablet (12.5 mg total) daily   Labs: 05/26/2021 Creatinine 0.99, BUN 24, Potassium 4.3, Sodium 135, GFR 81 05/17/2021 Creatinine 1.18, BUN 19, Potassium 3.9, Sodium 135, GFR >60  05/08/2021 Creatinine 1.21, BUN 21, Potassium 3.8, Sodium 138, GFR >60  05/02/2021 Creatinine 1.32, BUN 24, Potassium 3.9, Sodium 133, GFR 58  05/01/2021 Creatinine 1.03, BUN 17, Potassium 3.6, Sodium 136, GFR >60 04/14/2021 Creatinine 0.88, BUN 16, Potassium 2.7, Sodium 139, GFR >60  02/24/2021 Creatinine 0.97, BUN 20, Potassium 4.1, Sodium 138, GFR 83 A complete set of results can be found in Results Review.   Recommendations:  No changes.    Follow-up plan: ICM clinic phone appointment on 06/08/2022.    91 day device clinic remote transmission 06/12/2022.      EP/Cardiology Next Office Visit:  Missed 01/02/2022 with Dr Graciela Husbands and not rescheduled.  Recall 10/03/2022 with Dr Mariah Milling.   Copy of ICM check sent to Dr. Graciela Husbands.   3 month ICM trend: 05/04/2022.    12-14 Month ICM trend:     Karie Soda, RN 05/05/2022 5:01 PM

## 2022-05-26 ENCOUNTER — Emergency Department
Admission: EM | Admit: 2022-05-26 | Discharge: 2022-05-26 | Disposition: A | Discharge status: Home / Self Care | Payer: Medicare HMO | Attending: Emergency Medicine | Admitting: Emergency Medicine

## 2022-05-26 ENCOUNTER — Emergency Department: Payer: Medicare HMO

## 2022-05-26 ENCOUNTER — Other Ambulatory Visit: Payer: Self-pay

## 2022-05-26 DIAGNOSIS — E860 Dehydration: Secondary | ICD-10-CM | POA: Diagnosis not present

## 2022-05-26 DIAGNOSIS — R55 Syncope and collapse: Secondary | ICD-10-CM | POA: Diagnosis present

## 2022-05-26 DIAGNOSIS — I10 Essential (primary) hypertension: Secondary | ICD-10-CM | POA: Insufficient documentation

## 2022-05-26 LAB — CBC
HCT: 36.6 % — ABNORMAL LOW (ref 39.0–52.0)
Hemoglobin: 12.4 g/dL — ABNORMAL LOW (ref 13.0–17.0)
MCH: 31.3 pg (ref 26.0–34.0)
MCHC: 33.9 g/dL (ref 30.0–36.0)
MCV: 92.4 fL (ref 80.0–100.0)
Platelets: 228 10*3/uL (ref 150–400)
RBC: 3.96 MIL/uL — ABNORMAL LOW (ref 4.22–5.81)
RDW: 12.8 % (ref 11.5–15.5)
WBC: 8.7 10*3/uL (ref 4.0–10.5)
nRBC: 0 % (ref 0.0–0.2)

## 2022-05-26 LAB — BASIC METABOLIC PANEL
Anion gap: 9 (ref 5–15)
BUN: 27 mg/dL — ABNORMAL HIGH (ref 8–23)
CO2: 20 mmol/L — ABNORMAL LOW (ref 22–32)
Calcium: 7.8 mg/dL — ABNORMAL LOW (ref 8.9–10.3)
Chloride: 108 mmol/L (ref 98–111)
Creatinine, Ser: 1.39 mg/dL — ABNORMAL HIGH (ref 0.61–1.24)
GFR, Estimated: 54 mL/min — ABNORMAL LOW (ref >60)
Glucose, Bld: 142 mg/dL — ABNORMAL HIGH (ref 70–99)
Potassium: 3.9 mmol/L (ref 3.5–5.1)
Sodium: 137 mmol/L (ref 135–145)

## 2022-05-26 LAB — URINALYSIS, ROUTINE W REFLEX MICROSCOPIC
Bacteria, UA: NONE SEEN
Bilirubin Urine: NEGATIVE
Glucose, UA: NEGATIVE mg/dL
Hgb urine dipstick: NEGATIVE
Ketones, ur: NEGATIVE mg/dL
Leukocytes,Ua: NEGATIVE
Nitrite: NEGATIVE
Protein, ur: 100 mg/dL — AB
Specific Gravity, Urine: 1.011 (ref 1.005–1.030)
pH: 5 (ref 5.0–8.0)

## 2022-05-26 LAB — TROPONIN I (HIGH SENSITIVITY): Troponin I (High Sensitivity): 14 ng/L (ref <18)

## 2022-05-26 NOTE — ED Triage Notes (Signed)
Pt here via [facility] with hypotension, positive orthostatics. 18G RFA, 500 cc of fluid given, pt feels better. Pt denies pain, N/V.   117/76 86 paced 88% RA---96% 2L 17 146-cbg

## 2022-05-26 NOTE — ED Provider Notes (Signed)
[facility] Provider Note  Patient Contact: 3:25 PM (approximate)   History   Hypotension   HPI  Semir Scimeca is a 72 y.o. male who presents the emergency department complaining of hypertension, syncopal episode.  Patient states that today he is in the restroom, to stand up, started feeling lightheaded so he sat down.  Call for his wife and when she arrived, she states that patient was saying that he was dizzy, did not feel good and sweating.  Patient attempted to stand up, this made the symptoms worse and then patient had a brief syncopal episode.  Patient came to, is felt much better since.  Patient was noted to have orthostatic hypotension with EMS.   Patient does have congestive heart failure and so EMS gave 500 fluids with an immediate improvement in his blood pressure.  Patient states that he feels completely back to normal.  He has not had any recent illnesses, he had no headache, visual changes, unilateral weakness, slurred speech, chest pain, abdominal pain, nausea, vomiting, diarrhea, URI symptoms prior to the episode of hypotension and syncope.  Patient states that he has recently switched over to caffeinated beverages and has been drinking several caffeinated beverages including taking his diuretic.  Patient has not had any water weight gain or loss recently.     Physical Exam   Triage Vital Signs: ED Triage Vitals  Enc Vitals Group     BP 05/26/22 1307 (!) 135/58     Pulse Rate 05/26/22 1306 60     Resp 05/26/22 1306 18     Temp 05/26/22 1306 97.9 F (36.6 C)     Temp Source 05/26/22 1306 Oral     SpO2 05/26/22 1306 91 %     Weight 05/26/22 1306 231 lb 7.7 oz (105 kg)     Height 05/26/22 1306 5\' 9"  (1.753 m)     Head Circumference --      Peak Flow --      Pain Score 05/26/22 1306 0     Pain Loc --      Pain Edu? --      Excl. in GC? --     Most recent vital signs: Vitals:   05/26/22 1306 05/26/22 1307  BP:  (!) 135/58  Pulse: 60    Resp: 18   Temp: 97.9 F (36.6 C)   SpO2: 91%      General: Alert and in no acute distres  Neck: No stridor. No cervical spine tenderness to palpation.  Cardiovascular:  Good peripheral perfusion.  Normal S1 and S2. Respiratory: Normal respiratory effort without tachypnea or retractions. Lungs CTAB. Good air entry to the bases with no decreased or absent breath sounds. Gastrointestinal: Bowel sounds 4 quadrants. Soft and nontender to palpation. No guarding or rigidity. No palpable masses. No distention. No CVA tenderness. Musculoskeletal: Full range of motion to all extremities.  Neurologic:  No gross focal neurologic deficits are appreciated.  Cranial nerves II through XII grossly intact. Skin:   No rash noted Other:   ED Results / Procedures / Treatments   Labs (all labs ordered are listed, but only abnormal results are displayed) Labs Reviewed  BASIC METABOLIC PANEL - Abnormal; Notable for the following components:      Result Value   CO2 20 (*)    Glucose, Bld 142 (*)    BUN 27 (*)    Creatinine, Ser 1.39 (*)    Calcium 7.8 (*)    GFR, Estimated 54 (*)  All other components within normal limits  CBC - Abnormal; Notable for the following components:   RBC 3.96 (*)    Hemoglobin 12.4 (*)    HCT 36.6 (*)    All other components within normal limits  URINALYSIS, ROUTINE W REFLEX MICROSCOPIC - Abnormal; Notable for the following components:   Color, Urine YELLOW (*)    APPearance CLEAR (*)    Protein, ur 100 (*)    All other components within normal limits  TROPONIN I (HIGH SENSITIVITY)     EKG  ED ECG REPORT I, Delorise Royals Ruble Pumphrey,  personally viewed and interpreted this ECG.   Date: 05/26/2022  EKG Time: 1313 hrs.  Rate: 60 bpm  Rhythm: unchanged from previous tracings, biventricularly paced rhythm.  Compared to previous EKGs from 2023 no significant change.  Axis: Normal axis  Intervals: Paced rhythm  ST&T Change: No gross ST elevation or  depression   Biventricularly paced rhythm.  No significant change compared to previous EKGs from 2023    RADIOLOGY  I personally viewed, evaluated, and interpreted these images as part of my medical decision making, as well as reviewing the written report by the radiologist.  ED Provider Interpretation: No acute cardiopulmonary findings on chest x-ray.  DG Chest 2 View  Result Date: 05/26/2022 CLINICAL DATA:  Syncope. EXAM: CHEST - 2 VIEW COMPARISON:  October 03, 2021. FINDINGS: Stable cardiomediastinal silhouette. Left-sided defibrillator is unchanged in position. Both lungs are clear. The visualized skeletal structures are unremarkable. IMPRESSION: No active cardiopulmonary disease. Electronically Signed   By: Lupita Raider M.D.   On: 05/26/2022 15:59    PROCEDURES:  Critical Care performed: No  Procedures   MEDICATIONS ORDERED IN ED: Medications - No data to display   IMPRESSION / MDM / ASSESSMENT AND PLAN / ED COURSE  I reviewed the triage vital signs and the nursing notes.                                 Differential diagnosis includes, but is not limited to, dehydration, CHF exacerbation, ACS/STEMI, CVA, vasovagal episode  The patient is on the cardiac monitor to evaluate for evidence of arrhythmia and/or significant heart rate changes.  Patient's presentation is most consistent with acute presentation with potential threat to life or bodily function.   Patient's diagnosis is consistent with dehydration, vasovagal syncope.  Patient presents emergency department after having what appears to be some hypotension secondary to dehydration.  Patient states that he has recently started drinking caffeinated soda throughout the day.  He states that he is typically drinking 1 caffeinated beverages but did switch over to caffeine.  Patient is been drinking primarily caffeinated beverages and taking his fluid pill at the same time.  Today patient was using the restroom, stood  up, started to feel lightheaded, as he sat back down he started to feel little dizzy.  He tried once again, had worsening symptoms.  Patient's wife stated that he became diaphoretic, and then had a brief syncopal episode.  Since waking patient has felt better.  He was orthostatic hypotensive with EMS, received 500 mL of saline and route.  Patient arrived and was borderline hypoxic but has been in the room without oxygen throughout his encounter and maintain O2 saturations in the mid to upper 90 percentile.  Patient feels completely back to baseline.  Blood pressure has returned back to normal range.  No orthostatics with me in the  room.  Patient had labs, chest x-ray, EKG.  He is in a paced rhythm.  Troponin is reassuring.  Patient has reassuring labs though he has a very slight bump in his creatinine also likely secondary to some mild dehydration.  Did not want to provide more fluids as patient is back to his baseline, he has a CHF patient.  He has been well-maintained on his diuretics and his weight is well-controlled without any peripheral edema.  At this time as patient is back at his baseline I feel he is stable for discharge.  Given concerning signs and symptoms and return precaution with the patient.  Recommend switching back to an caffeinated beverages as the caffeine plus diuretics can dehydrate him.  Patient is agreeable with this plan.  Follow-up primary care as needed..  Patient is given ED precautions to return to the ED for any worsening or new symptoms.     FINAL CLINICAL IMPRESSION(S) / ED DIAGNOSES   Final diagnoses:  Dehydration  Vasovagal syncope     Rx / DC Orders   ED Discharge Orders     None        Note:  This document was prepared using Dragon voice recognition software and may include unintentional dictation errors.   Racheal Patches, PA-C 05/26/22 1639    Corena Herter, MD 05/26/22 2352

## 2022-06-08 ENCOUNTER — Ambulatory Visit: Payer: Medicare HMO | Attending: Internal Medicine

## 2022-06-08 DIAGNOSIS — I5022 Chronic systolic (congestive) heart failure: Secondary | ICD-10-CM | POA: Diagnosis not present

## 2022-06-08 DIAGNOSIS — Z95810 Presence of automatic (implantable) cardiac defibrillator: Secondary | ICD-10-CM | POA: Diagnosis not present

## 2022-06-10 NOTE — Progress Notes (Signed)
EPIC Encounter for ICM Monitoring  Patient Name: Adam Douglas is a 72 y.o. male Date: 06/10/2022 Primary Care Physican: Sherrin Daisy Danton Clap, MD Primary Cardiologist: Mariah Milling Electrophysiologist: Joycelyn Schmid Pacing: 98.5%         10/03/2021 Office Weight: 231 lbs                                                             Transmission reviewed.  ED on 5/21   Optivol thoracic impedance normal but was suggesting possible intermittent days with fluid accumulation within the last month.     Prescribed:   Furosemide 40 mg Take one tablet (40mg ) by mouth once daily, you may take one additional tablet (40mg ) once daily as needed for swelling or increase shortness of breath. Spironolactone 25 mg take 0.5 tablet (12.5 mg total) daily   Labs: 05/26/2021 Creatinine 0.99, BUN 24, Potassium 4.3, Sodium 135, GFR 81 05/17/2021 Creatinine 1.18, BUN 19, Potassium 3.9, Sodium 135, GFR >60  05/08/2021 Creatinine 1.21, BUN 21, Potassium 3.8, Sodium 138, GFR >60  05/02/2021 Creatinine 1.32, BUN 24, Potassium 3.9, Sodium 133, GFR 58  05/01/2021 Creatinine 1.03, BUN 17, Potassium 3.6, Sodium 136, GFR >60 04/14/2021 Creatinine 0.88, BUN 16, Potassium 2.7, Sodium 139, GFR >60  02/24/2021 Creatinine 0.97, BUN 20, Potassium 4.1, Sodium 138, GFR 83 A complete set of results can be found in Results Review.   Recommendations:  No changes.    Follow-up plan: ICM clinic phone appointment on 07/13/2022.    91 day device clinic remote transmission 09/11/2022.      EP/Cardiology Next Office Visit:  Message sent to EP scheduler to call patient for overdue appt with Dr Graciela Husbands (last visit 03/09/2020).  Recall 10/03/2022 with Dr Mariah Milling.   Copy of ICM check sent to Dr. Graciela Husbands.   3 month ICM trend: 06/08/2022.    12-14 Month ICM trend:     Karie Soda, RN 06/10/2022 4:39 PM

## 2022-06-12 ENCOUNTER — Ambulatory Visit (INDEPENDENT_AMBULATORY_CARE_PROVIDER_SITE_OTHER): Payer: Medicare HMO

## 2022-06-12 DIAGNOSIS — I255 Ischemic cardiomyopathy: Secondary | ICD-10-CM | POA: Diagnosis not present

## 2022-06-12 LAB — CUP PACEART REMOTE DEVICE CHECK
Battery Remaining Longevity: 53 mo
Battery Voltage: 2.97 V
Brady Statistic AP VP Percent: 43.72 %
Brady Statistic AP VS Percent: 0.65 %
Brady Statistic AS VP Percent: 54.79 %
Brady Statistic AS VS Percent: 0.84 %
Brady Statistic RA Percent Paced: 44.35 %
Brady Statistic RV Percent Paced: 57.82 %
Date Time Interrogation Session: 20240607012502
HighPow Impedance: 69 Ohm
Implantable Lead Connection Status: 753985
Implantable Lead Connection Status: 753985
Implantable Lead Connection Status: 753985
Implantable Lead Implant Date: 20200313
Implantable Lead Implant Date: 20200313
Implantable Lead Implant Date: 20200313
Implantable Lead Location: 753858
Implantable Lead Location: 753859
Implantable Lead Location: 753860
Implantable Lead Model: 4396
Implantable Lead Model: 5076
Implantable Lead Model: 6935
Implantable Pulse Generator Implant Date: 20200313
Lead Channel Impedance Value: 247 Ohm
Lead Channel Impedance Value: 342 Ohm
Lead Channel Impedance Value: 342 Ohm
Lead Channel Impedance Value: 475 Ohm
Lead Channel Impedance Value: 475 Ohm
Lead Channel Impedance Value: 817 Ohm
Lead Channel Pacing Threshold Amplitude: 0.625 V
Lead Channel Pacing Threshold Amplitude: 1 V
Lead Channel Pacing Threshold Amplitude: 1 V
Lead Channel Pacing Threshold Pulse Width: 0.4 ms
Lead Channel Pacing Threshold Pulse Width: 0.4 ms
Lead Channel Pacing Threshold Pulse Width: 0.4 ms
Lead Channel Sensing Intrinsic Amplitude: 2.5 mV
Lead Channel Sensing Intrinsic Amplitude: 2.5 mV
Lead Channel Sensing Intrinsic Amplitude: 7.5 mV
Lead Channel Sensing Intrinsic Amplitude: 7.5 mV
Lead Channel Setting Pacing Amplitude: 1.5 V
Lead Channel Setting Pacing Amplitude: 1.5 V
Lead Channel Setting Pacing Amplitude: 2 V
Lead Channel Setting Pacing Pulse Width: 0.4 ms
Lead Channel Setting Pacing Pulse Width: 0.4 ms
Lead Channel Setting Sensing Sensitivity: 0.3 mV
Zone Setting Status: 755011
Zone Setting Status: 755011

## 2022-06-17 ENCOUNTER — Telehealth: Payer: Self-pay | Admitting: Cardiovascular Disease

## 2022-06-17 NOTE — Telephone Encounter (Signed)
Returned call to pt's wife, pt is now having Warfarin managed and monitored by PCP.  Will discontinue pt's anticoagulation episode in Epic.

## 2022-06-17 NOTE — Telephone Encounter (Signed)
Patient's wife called stating his gets his INR check at every month at his PCP office, sometimes every goes every two weeks.  He had one done this past Friday it was 3.2.

## 2022-06-23 ENCOUNTER — Encounter: Payer: Self-pay | Admitting: General Practice

## 2022-06-29 NOTE — Progress Notes (Signed)
Remote ICD transmission.   

## 2022-07-07 ENCOUNTER — Encounter: Payer: Self-pay | Admitting: Cardiovascular Disease

## 2022-07-13 ENCOUNTER — Ambulatory Visit: Payer: Medicare HMO | Attending: Internal Medicine

## 2022-07-13 DIAGNOSIS — Z95810 Presence of automatic (implantable) cardiac defibrillator: Secondary | ICD-10-CM | POA: Diagnosis not present

## 2022-07-13 DIAGNOSIS — I5022 Chronic systolic (congestive) heart failure: Secondary | ICD-10-CM

## 2022-07-17 NOTE — Progress Notes (Signed)
EPIC Encounter for ICM Monitoring  Patient Name: Adam Douglas is a 72 y.o. male Date: 07/17/2022 Primary Care Physican: Louis Matte, MD Primary Cardiologist: Mariah Milling Electrophysiologist: Joycelyn Schmid Pacing: 98.5%         10/03/2021 Office Weight: 231 lbs                                                             Transmission reviewed.     Optivol thoracic impedance suggesting normal fluid levels within the last month.     Prescribed:   Furosemide 40 mg Take one tablet (40mg ) by mouth once daily, you may take one additional tablet (40mg ) once daily as needed for swelling or increase shortness of breath. Spironolactone 25 mg take 0.5 tablet (12.5 mg total) daily   Labs: 05/26/2022 Creatinine 1.39, BUN 27, Potassium 3.9, Sodium 137, GFR 54 A complete set of results can be found in Results Review.   Recommendations:  No changes.    Follow-up plan: ICM clinic phone appointment on 08/17/2022.    91 day device clinic remote transmission 09/11/2022.      EP/Cardiology Next Office Visit:  08/04/2022 with Dr Graciela Husbands (last visit 03/09/2020).  Recall 10/03/2022 with Dr Mariah Milling.   Copy of ICM check sent to Dr. Graciela Husbands.   3 month ICM trend: 07/13/2022.    12-14 Month ICM trend:     Karie Soda, RN 07/17/2022 2:33 PM

## 2022-08-04 ENCOUNTER — Encounter: Payer: Self-pay | Admitting: Internal Medicine

## 2022-08-04 ENCOUNTER — Ambulatory Visit: Payer: Medicare HMO | Attending: Internal Medicine | Admitting: Internal Medicine

## 2022-08-04 VITALS — BP 152/60 | HR 60 | Ht 69.0 in | Wt 243.8 lb

## 2022-08-04 DIAGNOSIS — I447 Left bundle-branch block, unspecified: Secondary | ICD-10-CM | POA: Diagnosis not present

## 2022-08-04 DIAGNOSIS — I255 Ischemic cardiomyopathy: Secondary | ICD-10-CM | POA: Diagnosis not present

## 2022-08-04 DIAGNOSIS — I509 Heart failure, unspecified: Secondary | ICD-10-CM

## 2022-08-04 DIAGNOSIS — I4891 Unspecified atrial fibrillation: Secondary | ICD-10-CM

## 2022-08-04 DIAGNOSIS — Z95810 Presence of automatic (implantable) cardiac defibrillator: Secondary | ICD-10-CM

## 2022-08-04 MED ORDER — SPIRONOLACTONE 25 MG PO TABS: 25.0000 mg | ORAL_TABLET | Freq: Every day | ORAL | 3 refills | Status: AC

## 2022-08-04 NOTE — Patient Instructions (Addendum)
Medication Instructions:  Increase Furosemide 80 mg for three days then decrease back to 40 mg daily  Increase Spironolactone to 25 mg daily   *If you need a refill on your cardiac medications before your next appointment, please call your pharmacy*   Lab Work: Your provider would like for you to have following labs drawn in 2 weeks BMET.   If you have labs (blood work) drawn today and your tests are completely normal, you will receive your results only by: MyChart Message (if you have MyChart) OR A paper copy in the mail If you have any lab test that is abnormal or we need to change your treatment, we will call you to review the results.   Testing/Procedures: Your physician has recommended that you have a sleep study. This test records several body functions during sleep, including: brain activity, eye movement, oxygen and carbon dioxide blood levels, heart rate and rhythm, breathing rate and rhythm, the flow of air through your mouth and nose, snoring, body muscle movements, and chest and belly movement. (Referral to Pulmonology- they will reach out to get this scheduled)   Follow-Up: At [facility], you and your health needs are our priority.  As part of our continuing mission to provide you with exceptional heart care, we have created designated Provider Care Teams.  These Care Teams include your primary Cardiologist (physician) and Advanced Practice Providers (APPs -  Physician Assistants and Nurse Practitioners) who all work together to provide you with the care you need, when you need it.  We recommend signing up for the patient portal called "MyChart".  Sign up information is provided on this After Visit Summary.  MyChart is used to connect with patients for Virtual Visits (Telemedicine).  Patients are able to view lab/test results, encounter notes, upcoming appointments, etc.  Non-urgent messages can be sent to your provider as well.   To learn more about what you can do  with MyChart, go to [email or web address].    Your next appointment:   12 month(s)  Provider:   Sherryl Manges, MD

## 2022-08-04 NOTE — Progress Notes (Signed)
Patient Care Team: Entzminger, Danton Clap, MD as PCP - General (Internal Medicine) Antonieta Iba, MD as PCP - Cardiology (Cardiology)   HPI  Adam Douglas is a 72 y.o. male seen in follow-up CRT-D [employer]   implantation 3/20 for primary prevention in the setting of nonischemic/ischemic heart disease prior non-STEMI and medically treated three-vessel coronary disease   He has a history of prior strokes.  And managed with a combination of warfarin (previously apixaban) and aspirin  The patient denies chest pain,, nocturnal dyspnea orthopnea.  There have been no palpitations, lightheadedness or syncope.  Complains of dyspnea on exertion which is worse over the last 6 months concurrent with weight gain.  Some edema.  Sleep disordered breathing and apneas witnessed by his wife Has been offered cardiac rehab and has declined, acknowledges depression.    DATE TEST EF    2/19 LHC  <20 % Severe 3V CAD>> med Rx  2/19 Echo   20-25 %    7/19 Echo 30-35%    10/19 Echo  30-35%   3/20 Echo  30-35%   9/22 Echo  50-55%     Date Cr K Hgb  3/19 1.26 4.9 11.5  6/19   11.9   }10/19 1.10 4.7    5/24 1.39 3.9 12.4     Thromboembolic risk factors ( age -65 , HTN-1, TIA/CVA-2, DM-1, Vasc disease -1, CHF-1) for a CHADSVASc Score of >/=7     Past Medical History:  Diagnosis Date   Angioedema    a. 04/2021 - presumed to be 2/2 entresto; b. 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   CAD (coronary artery disease)    a. 02/2017 NSTEMI/Cath: LM 40d, LAD 79m, 61m/d, D2 small w/ sev prox/m dzs, LCX nl, OM1 sev diff dzs - small vessel, OM2 80-90p/m, RCA dominant 84m, RPDA 50, EF <20%-->Med Rx.   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 02/2017 Echo: EF 20-25%; b. 03/2018 Echo: EF 30-35%, mild conc LVH. Impaired relaxation. Nl RV fxn. Mild BAE. Mild to mod MR. Mild Ao root dil;  c. 09/2020 Echo: EF 50%, glob HK, mod LVH. GrI DD. Nl RV size/fxn. Mod dil LA. Mild MR.   Hyperlipidemia    Hypertension    Ischemic cardiomyopathy    a. 02/2017 Echo: EF 20-25%; b. 03/2018 Echo: EF 30-35%; c. 03/2018 s/p MDT [id number] [place] MRI CRT-D; d. 09/2020 Echo: EF 50%.   LBBB (left bundle branch block)    a. Noted 02/2017. Pt denies any known prior history of LBBB (not present on 2014 [facility] ECG interpretation).   Morbid obesity (HCC)    Pacemaker    Stroke [facility])    a. Ocular strokes x 3 - prev eval in [facility] for first 2, [facility] for last one.    Past Surgical History:  Procedure Laterality Date   BIV ICD INSERTION CRT-D N/A 03/18/2018   Procedure: BIV ICD INSERTION CRT-D;  Surgeon: Duke Salvia, MD;  Location: [facility] INVASIVE CV LAB;  Service: Cardiovascular;  Laterality: N/A;   CERVICAL DISC SURGERY     CHOLECYSTECTOMY     LEFT HEART CATH AND CORONARY ANGIOGRAPHY N/A 03/03/2017   Procedure: LEFT HEART CATH AND CORONARY ANGIOGRAPHY;  Surgeon: Antonieta Iba, MD;  Location: [facility] INVASIVE CV LAB;  Service: Cardiovascular;  Laterality: N/A;    Current Meds  Medication Sig  acetaminophen (TYLENOL) 650 MG CR tablet Take 1,300 mg by mouth 2 (two) times daily.   aspirin EC 81 MG tablet Take 1 tablet (81 mg total) by mouth daily.   atorvastatin (LIPITOR) 80 MG tablet Take 80 mg by mouth daily.   carvedilol (COREG) 12.5 MG tablet Take 1 tablet by mouth twice daily   citalopram (CELEXA) 40 MG tablet Take 40 mg by mouth daily.   furosemide (LASIX) 40 MG tablet TAKE 1 TABLET BY MOUTH ONCE DAILY *YOU MAY TAKE ONE ADDITIONAL TABLET ONCE DAILY AS NEEDED FOR SWELLING OR INCREASE SHORTNESS OF BREATH*   insulin aspart (NOVOLOG) 100 UNIT/ML injection Inject 15 Units into the skin 3 (three) times daily with meals.   metFORMIN (GLUCOPHAGE-XR) 500 MG 24 hr tablet Take 500 mg by mouth 2 (two) times daily.   nitroGLYCERIN (NITROSTAT) 0.4 MG SL tablet PLACE 1 TABLET UNDER THE TONGUE EVERY 5 MINUTES AS  NEEDED   spironolactone (ALDACTONE) 25 MG tablet Take 1/2 (one-half) tablet by mouth once daily   traMADol (ULTRAM) 50 MG tablet Take 50-100 mg by mouth every 8 (eight) hours as needed for pain.   warfarin (COUMADIN) 5 MG tablet TAKE 1 TO 2 TABLETS BY MOUTH ONCE DAILY OR AS DIRECTED BY THE COUMADIN CLINIC    Allergies  Allergen Reactions   Sacubitril-Valsartan Swelling    Of tongue Of tongue Entresto      Review of Systems negative except from HPI and PMH  Physical Exam BP (!) 140/78   Pulse 60   Ht 5\' 9"  (1.753 m)   Wt 243 lb 12.8 oz (110.6 kg)   SpO2 96%   BMI 36.00 kg/m  Well developed and well nourished in no acute distress HENT normal Neck supple with JVP-7 Clear Device pocket well healed; without hematoma or erythema.  There is no tethering  Regular rate and rhythm, no  gallop murmur Abd-soft with active BS No Clubbing cyanosis 1+ edema Skin-warm and dry A & Oriented  Grossly normal sensory and motor function  ECG AV pacing with an upright QRS lead I and a RSR prime in lead V1 QRSd about 120 ms Post implant ECG 3/20 demonstrated a QR in lead I and a negative Q RS lead V1 Device function is normal. Programming changes none  See Paceart for details       Assessment and Plan:  Cardiomyopathy ischemic/nonischemic  Congestive heart failure class 2b-3a  Left bundle branch block  Hypertension    Strokes x3  Atrial fibrillation  Anemia   Memory deficits following stroke  Significant interval improvement in LV function following CRT.  Even though the ECG does not look great, QRS duration is impressively narrow.  No reprogramming.  Worsening dyspnea concurrent with weight gain.  Acknowledges depression lack of "gumption "willing to talk to his PCP about depression.  Sleep disordered breathing and obstructive sleep apnea; will arrange for sleep study  With aspirin concomitant with warfarin, will check with Dr. Knute Neu as to whether the aspirin is necessary,  on a brief survey I cannot find information on this but I suspect it is.  The INR target in this cohort is 2--2.5  No interval atrial fibrillation detected on his device.  Continue warfarin  Blood pressure is elevated.  Will increase his spironolactone from 12.5--25 mg daily and recheck a metabolic profile in 2 weeks

## 2022-08-05 ENCOUNTER — Encounter: Payer: Self-pay | Admitting: Cardiovascular Disease

## 2022-08-17 ENCOUNTER — Ambulatory Visit: Payer: Medicare HMO | Attending: Internal Medicine

## 2022-08-17 DIAGNOSIS — I5022 Chronic systolic (congestive) heart failure: Secondary | ICD-10-CM | POA: Diagnosis not present

## 2022-08-17 DIAGNOSIS — Z95810 Presence of automatic (implantable) cardiac defibrillator: Secondary | ICD-10-CM

## 2022-08-21 NOTE — Progress Notes (Signed)
EPIC Encounter for ICM Monitoring  Patient Name: Adam Douglas is a 72 y.o. male Date: 08/21/2022 Primary Care Physican: Sherrin Daisy Danton Clap, MD Primary Cardiologist: Mariah Milling Electrophysiologist: Joycelyn Schmid Pacing: 98.6%         08/04/2022 Office Weight: 2243 lbs                                                             Transmission reviewed.     Optivol thoracic impedance suggesting intermittent days with possible fluid accumulation within the last month.     Prescribed:   Furosemide 40 mg Take one tablet (40mg ) by mouth once daily, you may take one additional tablet (40mg ) once daily as needed for swelling or increase shortness of breath. Spironolactone 25 mg take 1 tablet (25 mg total) daily   Labs: 05/26/2022 Creatinine 1.39, BUN 27, Potassium 3.9, Sodium 137, GFR 54 A complete set of results can be found in Results Review.   Recommendations:  No changes.    Follow-up plan: ICM clinic phone appointment on 09/21/2022.    91 day device clinic remote transmission 09/11/2022.      EP/Cardiology Next Office Visit:  Recall 08/04/2023 with Dr Graciela Husbands (last visit 03/09/2020).  10/06/2022 with Dr Mariah Milling.   Copy of ICM check sent to Dr. Graciela Husbands.   3 month ICM trend: 08/17/2022.    12-14 Month ICM trend:     Karie Soda, RN 08/21/2022 11:48 AM

## 2022-08-25 ENCOUNTER — Ambulatory Visit: Payer: Medicare HMO | Admitting: Pulmonary Disease

## 2022-08-25 DIAGNOSIS — Z91199 Patient's noncompliance with other medical treatment and regimen due to unspecified reason: Secondary | ICD-10-CM

## 2022-09-03 ENCOUNTER — Encounter: Payer: Self-pay | Admitting: Internal Medicine

## 2022-09-03 ENCOUNTER — Ambulatory Visit (INDEPENDENT_AMBULATORY_CARE_PROVIDER_SITE_OTHER): Payer: Medicare HMO | Admitting: Internal Medicine

## 2022-09-03 VITALS — BP 146/70 | HR 60 | Temp 97.9°F | Ht 70.0 in | Wt 246.6 lb

## 2022-09-03 DIAGNOSIS — G4719 Other hypersomnia: Secondary | ICD-10-CM | POA: Diagnosis not present

## 2022-09-03 NOTE — Progress Notes (Signed)
Name: Adam Douglas MRN: [id number] DOB: 12-28-1950    CHIEF COMPLAINT:  EXCESSIVE DAYTIME SLEEPINESS   HISTORY OF PRESENT ILLNESS: Patient is seen today for problems and issues with sleep related to excessive daytime sleepiness Patient  has been having sleep problems for many years Patient has been having excessive daytime sleepiness for a long time Patient has been having extreme fatigue and tiredness, lack of energy +  very Loud snoring every night + struggling breathe at night and gasps for air   Discussed sleep data and reviewed with patient.  Encouraged proper weight management.  Discussed driving precautions and its relationship with hypersomnolence.  Discussed operating dangerous equipment and its relationship with hypersomnolence.  Discussed sleep hygiene, and benefits of a fixed sleep waked time.  The importance of getting eight or more hours of sleep discussed with patient.  Discussed limiting the use of the computer and television before bedtime.  Decrease naps during the day, so night time sleep will become enhanced.  Limit caffeine, and sleep deprivation.  HTN, stroke, and heart failure are potential risk factors.    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  has a past surgical history that includes Cervical disc surgery; Cholecystectomy; LEFT HEART CATH AND CORONARY ANGIOGRAPHY (N/A, 03/03/2017); and BIV ICD INSERTION CRT-D (N/A, 03/18/2018). Prior to Admission medications   Medication Sig Start Date End Date Taking? Authorizing Provider  acetaminophen (TYLENOL) 650 MG CR tablet Take 1,300 mg by mouth  2 (two) times daily.    [provider]  amLODipine (NORVASC) 10 MG tablet Take 1 tablet (10 mg total) by mouth daily. 05/08/21 05/03/22  Creig Hines, NP  aspirin EC 81 MG tablet Take 1 tablet (81 mg total) by mouth daily. 03/09/17   Creig Hines, NP  atorvastatin (LIPITOR) 80 MG tablet Take 80 mg by mouth daily. 02/14/19   [provider]  carvedilol (COREG) 12.5 MG tablet Take 1 tablet by mouth twice daily 06/30/21   Antonieta Iba, MD  citalopram (CELEXA) 40 MG tablet Take 40 mg by mouth daily.    [provider]  cyclobenzaprine (FLEXERIL) 10 MG tablet Take 10 mg by mouth 3 (three) times daily as needed for muscle spasms. 01/19/20   [provider]  EPINEPHrine 0.3 mg/0.3 mL IJ SOAJ injection Inject 0.3 mg into the muscle as needed for up to 1 dose for anaphylaxis. 05/17/21   Gilles Chiquito, MD  furosemide (LASIX) 40 MG tablet TAKE 1 TABLET BY MOUTH ONCE DAILY *YOU MAY TAKE ONE ADDITIONAL TABLET ONCE DAILY AS NEEDED FOR SWELLING OR INCREASE SHORTNESS OF BREATH* 11/21/21   Gollan, Tollie Pizza, MD  insulin aspart (NOVOLOG) 100 UNIT/ML injection Inject 15 Units into the skin 3 (three) times daily with meals.    [provider]  insulin detemir (LEVEMIR) 100 UNIT/ML injection Inject 50 Units into the skin 2 (two) times daily.    [provider]  metFORMIN (GLUCOPHAGE-XR) 500 MG 24 hr tablet Take 500 mg by mouth 2 (two) times daily.    [provider]  nitroGLYCERIN (NITROSTAT) 0.4 MG SL tablet PLACE 1 TABLET UNDER THE TONGUE EVERY 5 MINUTES AS NEEDED 10/03/21   Antonieta Iba,  MD  predniSONE (DELTASONE) 10 MG tablet 4 tabs po day1,2; 3 tabs po day3,4; 2 tabs po day 5,6; 1 tab po day day 7,8; 1/2 tab po day9,10,11,12 05/02/21   Alford Highland, MD  spironolactone (ALDACTONE) 25 MG tablet Take 1 tablet (25 mg total) by mouth daily. 08/04/22   Duke Salvia, MD  traMADol (ULTRAM) 50 MG tablet Take 50-100 mg by mouth  every 8 (eight) hours as needed for pain.    [provider]  warfarin (COUMADIN) 5 MG tablet TAKE 1 TO 2 TABLETS BY MOUTH ONCE DAILY OR AS DIRECTED BY THE COUMADIN CLINIC 01/08/22   Antonieta Iba, MD   Allergies  Allergen Reactions   Sacubitril-Valsartan Swelling    Of tongue Of tongue Entresto    FAMILY HISTORY:  family history includes CAD in his sister; CAD (age of onset: 30) in his father; CAD (age of onset: 14) in his mother; Heart attack in his father and mother. SOCIAL HISTORY:  reports that he has quit smoking. His smoking use included cigarettes. He has a 15 pack-year smoking history. He has never used smokeless tobacco. He reports that he does not drink alcohol and does not use drugs.   Review of Systems:  Gen:  Denies  fever, sweats, chills weight loss  HEENT: Denies blurred vision, double vision, ear pain, eye pain, hearing loss, nose bleeds, sore throat Cardiac:  No dizziness, chest pain or heaviness, chest tightness,edema, No JVD Resp:   No cough, -sputum production, -shortness of breath,-wheezing, -hemoptysis,  Gi: Denies swallowing difficulty, stomach pain, nausea or vomiting, diarrhea, constipation, bowel incontinence Gu:  Denies bladder incontinence, burning urine Ext:   Denies Joint pain, stiffness or swelling Skin: Denies  skin rash, easy bruising or bleeding or hives Endoc:  Denies polyuria, polydipsia , polyphagia or weight change Psych:   Denies depression, insomnia or hallucinations  Other:  All other systems negative   ALL OTHER ROS ARE NEGATIVE   BP (!) 146/70 (BP Location: Left Arm, Cuff Size: Normal)   Pulse 60   Temp 97.9 F (36.6 C) (Oral)   Ht 5\' 10"  (1.778 m)   Wt 246 lb 9.6 oz (111.9 kg)   SpO2 94%   BMI 35.38 kg/m     Physical Examination:   General Appearance: No distress  EYES PERRLA, EOM intact.   NECK Supple, No JVD Pulmonary: normal breath sounds, No wheezing.  CardiovascularNormal S1,S2.  No m/r/g.   Abdomen:  Benign, Soft, non-tender. Skin:   warm, no rashes, no ecchymosis  Extremities: normal, no cyanosis, clubbing. Neuro:without focal findings,  speech normal  PSYCHIATRIC: Mood, affect within normal limits.   ALL OTHER ROS ARE NEGATIVE    ASSESSMENT AND PLAN SYNOPSIS  Patient with signs and symptoms of excessive daytime sleepiness with probable underlying diagnosis of obstructive sleep apnea in the setting of obesity and deconditioned state Underlying CAD  Recommend Sleep Study for definitve diagnosis  Obesity -recommend significant weight loss -recommend changing diet  Deconditioned state -Recommend increased daily activity and exercise   MEDICATION ADJUSTMENTS/LABS AND TESTS ORDERED: Recommend Sleep Study Recommend weight loss   CURRENT MEDICATIONS REVIEWED AT LENGTH WITH PATIENT TODAY   Patient  satisfied with Plan of action and management. All questions answered  Follow up  3 months  Total Time Spent  35 mins   Wallis Bamberg Santiago Glad, M.D.  [facility]  Medical Director [facility] Medical Director [facility] Cardio-Pulmonary Department

## 2022-09-03 NOTE — Patient Instructions (Signed)
Recommend home sleep study to assess for sleep apnea Recommend weight loss 

## 2022-09-04 ENCOUNTER — Encounter: Payer: Self-pay | Admitting: Cardiovascular Disease

## 2022-09-11 ENCOUNTER — Ambulatory Visit (INDEPENDENT_AMBULATORY_CARE_PROVIDER_SITE_OTHER): Payer: Medicare HMO

## 2022-09-11 DIAGNOSIS — I255 Ischemic cardiomyopathy: Secondary | ICD-10-CM

## 2022-09-11 LAB — CUP PACEART REMOTE DEVICE CHECK
Battery Remaining Longevity: 50 mo
Battery Voltage: 2.96 V
Brady Statistic AP VP Percent: 63.96 %
Brady Statistic AP VS Percent: 0.43 %
Brady Statistic AS VP Percent: 35.22 %
Brady Statistic AS VS Percent: 0.4 %
Brady Statistic RA Percent Paced: 64.37 %
Brady Statistic RV Percent Paced: 80.36 %
Date Time Interrogation Session: 20240906043822
HighPow Impedance: 63 Ohm
Implantable Lead Connection Status: 753985
Implantable Lead Connection Status: 753985
Implantable Lead Connection Status: 753985
Implantable Lead Implant Date: 20200313
Implantable Lead Implant Date: 20200313
Implantable Lead Implant Date: 20200313
Implantable Lead Location: 753858
Implantable Lead Location: 753859
Implantable Lead Location: 753860
Implantable Lead Model: 4396
Implantable Lead Model: 5076
Implantable Lead Model: 6935
Implantable Pulse Generator Implant Date: 20200313
Lead Channel Impedance Value: 247 Ohm
Lead Channel Impedance Value: 342 Ohm
Lead Channel Impedance Value: 342 Ohm
Lead Channel Impedance Value: 475 Ohm
Lead Channel Impedance Value: 513 Ohm
Lead Channel Impedance Value: 874 Ohm
Lead Channel Pacing Threshold Amplitude: 0.625 V
Lead Channel Pacing Threshold Amplitude: 0.75 V
Lead Channel Pacing Threshold Amplitude: 1 V
Lead Channel Pacing Threshold Pulse Width: 0.4 ms
Lead Channel Pacing Threshold Pulse Width: 0.4 ms
Lead Channel Pacing Threshold Pulse Width: 0.4 ms
Lead Channel Sensing Intrinsic Amplitude: 1.375 mV
Lead Channel Sensing Intrinsic Amplitude: 1.375 mV
Lead Channel Sensing Intrinsic Amplitude: 7.75 mV
Lead Channel Sensing Intrinsic Amplitude: 7.75 mV
Lead Channel Setting Pacing Amplitude: 1.25 V
Lead Channel Setting Pacing Amplitude: 1.5 V
Lead Channel Setting Pacing Amplitude: 2 V
Lead Channel Setting Pacing Pulse Width: 0.4 ms
Lead Channel Setting Pacing Pulse Width: 0.4 ms
Lead Channel Setting Sensing Sensitivity: 0.3 mV
Zone Setting Status: 755011
Zone Setting Status: 755011

## 2022-09-15 DIAGNOSIS — G4719 Other hypersomnia: Secondary | ICD-10-CM

## 2022-09-15 NOTE — Progress Notes (Signed)
Remote ICD transmission.   

## 2022-09-21 ENCOUNTER — Ambulatory Visit: Payer: Medicare HMO | Attending: Internal Medicine

## 2022-09-21 DIAGNOSIS — Z95810 Presence of automatic (implantable) cardiac defibrillator: Secondary | ICD-10-CM

## 2022-09-21 DIAGNOSIS — I5022 Chronic systolic (congestive) heart failure: Secondary | ICD-10-CM

## 2022-09-24 NOTE — Progress Notes (Signed)
EPIC Encounter for ICM Monitoring  Patient Name: Adam Douglas is a 72 y.o. male Date: 09/24/2022 Primary Care Physican: Louis Matte, MD Primary Cardiologist: Mariah Milling Electrophysiologist: Joycelyn Schmid Pacing: 98.7%         09/03/2022 Office Weight: 246 lbs                                                             Transmission reviewed.     Optivol thoracic impedance suggesting normal fluid levels within the last month.     Prescribed:   Furosemide 40 mg Take one tablet (40mg ) by mouth once daily, you may take one additional tablet (40mg ) once daily as needed for swelling or increase shortness of breath. Spironolactone 25 mg take 1 tablet (25 mg total) daily   Labs: 05/26/2022 Creatinine 1.39, BUN 27, Potassium 3.9, Sodium 137, GFR 54 A complete set of results can be found in Results Review.   Recommendations:  No changes.    Follow-up plan: ICM clinic phone appointment on 10/26/2022.    91 day device clinic remote transmission 12/11/2022.      EP/Cardiology Next Office Visit:  Recall 08/04/2023 with Dr Graciela Husbands.  10/06/2022 with Dr Mariah Milling.   Copy of ICM check sent to Dr. Graciela Husbands.   3 month ICM trend: 09/21/2022.    12-14 Month ICM trend:     Karie Soda, RN 09/24/2022 7:28 AM

## 2022-10-05 NOTE — Progress Notes (Deleted)
Date:  10/05/2022   ID:  Adam Douglas, DOB 1950-05-05, MRN [id number]  Patient Location:  [address] [facility] [place] [address]   Provider location:   [facility], [place] office  PCP:  Adam Daisy Danton Clap, MD  Cardiologist:  [facility]  No chief complaint on file.   History of Present Illness:    Adam Douglas is a 72 y.o. male past medical history of diabetes,  Hypertension, hyperlipidemia,  Former smoker, >30 years ago, smoked for 20 yrs  ocular strokes,  Admission 03/03/2017  for non-STEMI with finding of left bundle branch block,  severe multivessel CAD and an ischemic cardiomyopathy with an EF of 20-25% ICD Ejection fraction 50% on echocardiogram September 2022 Who presents for follow up of his cardiomyopathy and CAD  Last seen in clinic by myself September 2023   Seen by one of our providers May 2023 Episode of angioedema felt to be secondary to [name], stopped March 2023 Was seen in the emergency room April 14 2021 for tongue swelling lip swelling Was treated with steroids, antihistamines Back to the emergency room April 27 for motor symptoms No sx recently  Reports having chronic shortness of breath No regular exercise program, weight trending higher Poor diet  Pacer download reviewed, predominantly ventricularly paced Remains on Lasix 40 daily  Labs reviewed A1c 6.6 up to 7.2 Total chol 88, LDL 31  EKG personally reviewed by myself on todays visit Paced rhythm rate 65 bpm, underlying sinus  Other past medical history reviewed echocardiogram March 2020 Ejection fraction 30 to 35%, mild LVH, moderately dilated LV size, mild to moderate MR  EF 20 in 2019   30-35% March 2020  Hemoglobin A1c 7.1 down from 7.9  EKG personally reviewed by myself on todays visit Shows normal sinus rhythm rate 72 bpm PACs bundle branch block  Other past medical history reviewed  diagnostic catheterization 03/03/2017 revealing  moderate to severe diffuse, small vessel coronary artery disease with an EF of less than 20% by ventriculography.   Moderate to severe mid and distal LAD disease severe disease of small diagonal vessels, Severe disease of small OM1 and om2 vessels Medical management recommended   Past Medical History:  Diagnosis Date   Angioedema    a. 04/2021 - presumed to be 2/2 entresto; b. 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   CAD (coronary artery disease)    a. 02/2017 NSTEMI/Cath: LM 40d, LAD 63m, 53m/d, D2 small w/ sev prox/m dzs, LCX nl, OM1 sev diff dzs - small vessel, OM2 80-90p/m, RCA dominant 8m, RPDA 50, EF <20%-->Med Rx.   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 02/2017 Echo: EF 20-25%; b. 03/2018 Echo: EF 30-35%, mild conc LVH. Impaired relaxation. Nl RV fxn. Mild BAE. Mild to mod MR. Mild Ao root dil; c. 09/2020 Echo: EF 50%, glob HK, mod LVH. GrI DD. Nl RV size/fxn. Mod dil LA. Mild MR.   Hyperlipidemia    Hypertension    Ischemic cardiomyopathy    a. 02/2017 Echo: EF 20-25%; b. 03/2018 Echo: EF 30-35%; c. 03/2018 s/p MDT [id number] [facility] MRI CRT-D; d. 09/2020 Echo: EF 50%.   LBBB (left bundle branch block)    a. Noted 02/2017. Pt denies any known prior history of LBBB (not present on 2014 [facility] ECG interpretation).   Morbid obesity (HCC)    Pacemaker  Stroke [facility])    a. Ocular strokes x 3 - prev eval in [facility] for first 2, [facility] for last one.   Past Surgical History:  Procedure Laterality Date   BIV ICD INSERTION CRT-D N/A 03/18/2018   Procedure: BIV ICD INSERTION CRT-D;  Surgeon: Duke Salvia, MD;  Location: [facility] INVASIVE CV LAB;  Service: Cardiovascular;  Laterality: N/A;   CERVICAL DISC SURGERY     CHOLECYSTECTOMY     LEFT HEART CATH AND CORONARY ANGIOGRAPHY N/A 03/03/2017   Procedure: LEFT HEART CATH AND CORONARY ANGIOGRAPHY;  Surgeon: Antonieta Iba, MD;   Location: [facility] INVASIVE CV LAB;  Service: Cardiovascular;  Laterality: N/A;      Allergies:   Sacubitril-valsartan   Social History   Tobacco Use   Smoking status: Former    Current packs/day: 1.00    Average packs/day: 1 pack/day for 15.0 years (15.0 ttl pk-yrs)    Types: Cigarettes   Smokeless tobacco: Never   Tobacco comments:    Quit in his 30's. 1985  Vaping Use   Vaping status: Never Used  Substance Use Topics   Alcohol use: No   Drug use: No     Current Outpatient Medications on File Prior to Visit  Medication Sig Dispense Refill   acetaminophen (TYLENOL) 650 MG CR tablet Take 1,300 mg by mouth 2 (two) times daily.     amLODipine (NORVASC) 10 MG tablet Take 1 tablet (10 mg total) by mouth daily. 90 tablet 1   aspirin EC 81 MG tablet Take 1 tablet (81 mg total) by mouth daily. 90 tablet 3   atorvastatin (LIPITOR) 80 MG tablet Take 80 mg by mouth daily.     carvedilol (COREG) 12.5 MG tablet Take 1 tablet by mouth twice daily 180 tablet 0   citalopram (CELEXA) 40 MG tablet Take 40 mg by mouth daily.     cyclobenzaprine (FLEXERIL) 10 MG tablet Take 10 mg by mouth 3 (three) times daily as needed for muscle spasms.     EPINEPHrine 0.3 mg/0.3 mL IJ SOAJ injection Inject 0.3 mg into the muscle as needed for up to 1 dose for anaphylaxis. 2 each 0   furosemide (LASIX) 40 MG tablet TAKE 1 TABLET BY MOUTH ONCE DAILY *YOU MAY TAKE ONE ADDITIONAL TABLET ONCE DAILY AS NEEDED FOR SWELLING OR INCREASE SHORTNESS OF BREATH* 135 tablet 1   insulin aspart (NOVOLOG) 100 UNIT/ML injection Inject 15 Units into the skin 3 (three) times daily with meals.     insulin detemir (LEVEMIR) 100 UNIT/ML injection Inject 50 Units into the skin 2 (two) times daily.     metFORMIN (GLUCOPHAGE-XR) 500 MG 24 hr tablet Take 500 mg by mouth 2 (two) times daily.     nitroGLYCERIN (NITROSTAT) 0.4 MG SL tablet PLACE 1 TABLET UNDER THE TONGUE EVERY 5 MINUTES AS NEEDED 25 tablet 3   predniSONE (DELTASONE) 10 MG  tablet 4 tabs po day1,2; 3 tabs po day3,4; 2 tabs po day 5,6; 1 tab po day day 7,8; 1/2 tab po day9,10,11,12 (Patient not taking: Reported on 09/03/2022) 22 tablet 0   spironolactone (ALDACTONE) 25 MG tablet Take 1 tablet (25 mg total) by mouth daily. 90 tablet 3   traMADol (ULTRAM) 50 MG tablet Take 50-100 mg by mouth every 8 (eight) hours as needed for pain.     warfarin (COUMADIN) 5 MG tablet TAKE 1 TO 2 TABLETS BY MOUTH ONCE DAILY OR AS DIRECTED BY THE COUMADIN CLINIC 90 tablet 1   No  current facility-administered medications on file prior to visit.     Family Hx: The patient's family history includes CAD in his sister; CAD (age of onset: 28) in his father; CAD (age of onset: 48) in his mother; Heart attack in his father and mother.  ROS:   Please see the history of present illness.    Review of Systems  Constitutional: Negative.   HENT: Negative.    Respiratory: Negative.    Cardiovascular: Negative.   Gastrointestinal: Negative.   Musculoskeletal: Negative.   Neurological: Negative.   Psychiatric/Behavioral: Negative.    All other systems reviewed and are negative.    Labs/Other Tests and Data Reviewed:    Recent Labs: 05/26/2022: BUN 27; Creatinine, Ser 1.39; Hemoglobin 12.4; Platelets 228; Potassium 3.9; Sodium 137   Recent Lipid Panel Lab Results  Component Value Date/Time   CHOL 94 03/04/2017 08:15 PM   TRIG 117 03/04/2017 08:15 PM   HDL 35 (L) 03/04/2017 08:15 PM   CHOLHDL 2.7 03/04/2017 08:15 PM   LDLCALC 36 03/04/2017 08:15 PM    Wt Readings from Last 3 Encounters:  09/03/22 246 lb 9.6 oz (111.9 kg)  08/04/22 243 lb 12.8 oz (110.6 kg)  05/26/22 231 lb 7.7 oz (105 kg)     Exam:    Vital Signs: Vital signs may also be detailed in the HPI There were no vitals taken for this visit.  Constitutional:  oriented to person, place, and time. No distress.  HENT:  Head: Grossly normal Eyes:  no discharge. No scleral icterus.  Neck: No JVD, no carotid bruits   Cardiovascular: Regular rate and rhythm, no murmurs appreciated Pulmonary/Chest: Clear to auscultation bilaterally, no wheezes or rails Abdominal: Soft.  no distension.  no tenderness.  Musculoskeletal: Normal range of motion Neurological:  normal muscle tone. Coordination normal. No atrophy Skin: Skin warm and dry Psychiatric: normal affect, pleasant  ASSESSMENT & PLAN:    Problem List Items Addressed This Visit   None   Ischemic cardiomyopathy Ejection fraction improved up to 50 to 55% on echo September 2022 We recommended he continue Entresto, carvedilol, spironolactone, Lasix He is declining cardiac rehab  Cad with chronic stable angina Stressed importance of weight loss, walking program, limiting his carbohydrate intake -We did discuss if shortness of breath gets worse we can order stress testing He prefers to work on his conditioning first  Diabetes type 2 poorly controlled complications A1c stable 7.2 We have encouraged continued exercise, careful diet management in an effort to lose weight.  Hyperlipidemia Cholesterol is at goal on the current lipid regimen. No changes to the medications were made.   Total encounter time more than 30 minutes  Greater than 50% was spent in counseling and coordination of care with the patient    Signed, Julien Nordmann, MD  [facility] [address], [place], [place] [address]

## 2022-10-06 ENCOUNTER — Ambulatory Visit: Payer: Medicare HMO | Attending: Cardiovascular Disease | Admitting: Cardiovascular Disease

## 2022-10-06 DIAGNOSIS — Z95810 Presence of automatic (implantable) cardiac defibrillator: Secondary | ICD-10-CM

## 2022-10-06 DIAGNOSIS — I509 Heart failure, unspecified: Secondary | ICD-10-CM

## 2022-10-06 DIAGNOSIS — I447 Left bundle-branch block, unspecified: Secondary | ICD-10-CM

## 2022-10-06 DIAGNOSIS — I5022 Chronic systolic (congestive) heart failure: Secondary | ICD-10-CM

## 2022-10-06 DIAGNOSIS — I4891 Unspecified atrial fibrillation: Secondary | ICD-10-CM

## 2022-10-06 DIAGNOSIS — I214 Non-ST elevation (NSTEMI) myocardial infarction: Secondary | ICD-10-CM

## 2022-10-06 DIAGNOSIS — Z8673 Personal history of transient ischemic attack (TIA), and cerebral infarction without residual deficits: Secondary | ICD-10-CM

## 2022-10-06 DIAGNOSIS — I480 Paroxysmal atrial fibrillation: Secondary | ICD-10-CM

## 2022-10-06 DIAGNOSIS — I255 Ischemic cardiomyopathy: Secondary | ICD-10-CM

## 2022-10-07 ENCOUNTER — Encounter: Payer: Self-pay | Admitting: Cardiovascular Disease

## 2022-10-07 ENCOUNTER — Telehealth: Payer: Self-pay | Admitting: Cardiovascular Disease

## 2022-10-07 NOTE — Telephone Encounter (Signed)
Left voicemail to reschedule appt

## 2022-10-08 ENCOUNTER — Telehealth (INDEPENDENT_AMBULATORY_CARE_PROVIDER_SITE_OTHER): Payer: Medicare HMO | Admitting: Primary Care

## 2022-10-08 DIAGNOSIS — G4730 Sleep apnea, unspecified: Secondary | ICD-10-CM

## 2022-10-11 ENCOUNTER — Telehealth: Payer: Self-pay | Admitting: Pulmonary Disease

## 2022-10-11 DIAGNOSIS — G4733 Obstructive sleep apnea (adult) (pediatric): Secondary | ICD-10-CM

## 2022-10-11 NOTE — Telephone Encounter (Signed)
Call patient  Sleep study result  Date of study: 09/15/2022  Impression:  Severe obstructive sleep apnea with mild oxygen desaturations  Recommendation: DME referral  Recommend CPAP therapy for severe obstructive sleep apnea  Auto titrating CPAP with pressure settings of 5-20 will be appropriate  Encourage weight loss measures  Follow-up in the office 4 to 6 weeks following initiation of treatment

## 2022-10-13 NOTE — Telephone Encounter (Signed)
Spoke to both patient and patient's spouse, Olegario Messier. They both are aware of results. Patient agrees with cpap.  Order has been placed.  Patient will call back to schedule appt once setup on cpap. Nothing further needed.

## 2022-10-26 ENCOUNTER — Ambulatory Visit: Payer: Medicare HMO | Attending: Internal Medicine

## 2022-10-26 DIAGNOSIS — Z95810 Presence of automatic (implantable) cardiac defibrillator: Secondary | ICD-10-CM | POA: Diagnosis not present

## 2022-10-26 DIAGNOSIS — I5022 Chronic systolic (congestive) heart failure: Secondary | ICD-10-CM

## 2022-10-29 NOTE — Progress Notes (Signed)
EPIC Encounter for ICM Monitoring  Patient Name: Adam Douglas is a 72 y.o. male Date: 10/29/2022 Primary Care Physican: Sherrin Daisy Danton Clap, MD Primary Cardiologist: Mariah Milling Electrophysiologist: Joycelyn Schmid Pacing: 98.9%         09/03/2022 Office Weight: 246 lbs                                                             Transmission reviewed.     Optivol thoracic impedance suggesting normal fluid levels within the last month.     Prescribed:   Furosemide 40 mg Take one tablet (40mg ) by mouth once daily, you may take one additional tablet (40mg ) once daily as needed for swelling or increase shortness of breath. Spironolactone 25 mg take 1 tablet (25 mg total) daily   Labs: 05/26/2022 Creatinine 1.39, BUN 27, Potassium 3.9, Sodium 137, GFR 54 A complete set of results can be found in Results Review.   Recommendations:  No changes.    Follow-up plan: ICM clinic phone appointment on 12/07/2022.    91 day device clinic remote transmission 12/11/2022.      EP/Cardiology Next Office Visit:  Recall 08/04/2023 with Dr Graciela Husbands.  11/17/2022 with Dr Mariah Milling.   Copy of ICM check sent to Dr. Graciela Husbands.    3 month ICM trend: 10/26/2022.    12-14 Month ICM trend:     Karie Soda, RN 10/29/2022 12:19 PM

## 2022-11-16 NOTE — Progress Notes (Deleted)
Date:  11/16/2022   ID:  [name], DOB Jun 03, 1950, MRN [id number]  Patient Location:  [address] [facility] [place] [address]   Provider location:   [facility], [place] office  PCP:  Sherrin Daisy Danton Clap, MD  Cardiologist:  [facility]  No chief complaint on file.   History of Present Illness:    Adam Douglas is a 72 y.o. male past medical history of diabetes,  Hypertension, hyperlipidemia,  Former smoker, >30 years ago, smoked for 20 yrs  ocular strokes,  Admission 03/03/2017  for non-STEMI with finding of left bundle branch block,  severe multivessel CAD and an ischemic cardiomyopathy with an EF of 20-25% ICD Ejection fraction 50% on echocardiogram September 2022 Who presents for follow up of his cardiomyopathy and CAD  Last seen in clinic by myself September 2023 Seen by EP July 2024    Seen by one of our providers May 2023 Episode of angioedema felt to be secondary to [facility], stopped March 2023 Was seen in the emergency room April 14 2021 for tongue swelling lip swelling Was treated with steroids, antihistamines Back to the emergency room April 27 for motor symptoms No sx recently  Reports having chronic shortness of breath No regular exercise program, weight trending higher Poor diet  Pacer download reviewed, predominantly ventricularly paced Remains on Lasix 40 daily  Labs reviewed A1c 6.6 up to 7.2 Total chol 88, LDL 31  EKG personally reviewed by myself on todays visit Paced rhythm rate 65 bpm, underlying sinus  Other past medical history reviewed echocardiogram March 2020 Ejection fraction 30 to 35%, mild LVH, moderately dilated LV size, mild to moderate MR  EF 20 in 2019   30-35% March 2020  Hemoglobin A1c 7.1 down from 7.9  EKG personally reviewed by myself on todays visit Shows normal sinus rhythm rate 72 bpm PACs bundle branch block  Other past medical history reviewed  diagnostic catheterization  03/03/2017 revealing moderate to severe diffuse, small vessel coronary artery disease with an EF of less than 20% by ventriculography.   Moderate to severe mid and distal LAD disease severe disease of small diagonal vessels, Severe disease of small OM1 and om2 vessels Medical management recommended   Past Medical History:  Diagnosis Date   Angioedema    a. 04/2021 - presumed to be 2/2 entresto; b. 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   CAD (coronary artery disease)    a. 02/2017 NSTEMI/Cath: LM 40d, LAD 38m, 16m/d, D2 small w/ sev prox/m dzs, LCX nl, OM1 sev diff dzs - small vessel, OM2 80-90p/m, RCA dominant 7m, RPDA 50, EF <20%-->Med Rx.   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 02/2017 Echo: EF 20-25%; b. 03/2018 Echo: EF 30-35%, mild conc LVH. Impaired relaxation. Nl RV fxn. Mild BAE. Mild to mod MR. Mild Ao root dil; c. 09/2020 Echo: EF 50%, glob HK, mod LVH. GrI DD. Nl RV size/fxn. Mod dil LA. Mild MR.   Hyperlipidemia    Hypertension    Ischemic cardiomyopathy    a. 02/2017 Echo: EF 20-25%; b. 03/2018 Echo: EF 30-35%; c. 03/2018 s/p MDT [id number] [facility] MRI CRT-D; d. 09/2020 Echo: EF 50%.   LBBB (left bundle branch block)    a. Noted 02/2017. Pt denies any known prior history of LBBB (not present on 2014 [facility] ECG interpretation).   Morbid  obesity (HCC)    Pacemaker    Stroke [facility])    a. Ocular strokes x 3 - prev eval in [facility] for first 2, [facility] for last one.   Past Surgical History:  Procedure Laterality Date   BIV ICD INSERTION CRT-D N/A 03/18/2018   Procedure: BIV ICD INSERTION CRT-D;  Surgeon: Duke Salvia, MD;  Location: [facility] INVASIVE CV LAB;  Service: Cardiovascular;  Laterality: N/A;   CERVICAL DISC SURGERY     CHOLECYSTECTOMY     LEFT HEART CATH AND CORONARY ANGIOGRAPHY N/A 03/03/2017   Procedure: LEFT HEART CATH AND CORONARY ANGIOGRAPHY;  Surgeon:  Antonieta Iba, MD;  Location: [facility] INVASIVE CV LAB;  Service: Cardiovascular;  Laterality: N/A;      Allergies:   Sacubitril-valsartan   Social History   Tobacco Use   Smoking status: Former    Current packs/day: 1.00    Average packs/day: 1 pack/day for 15.0 years (15.0 ttl pk-yrs)    Types: Cigarettes   Smokeless tobacco: Never   Tobacco comments:    Quit in his 30's. 1985  Vaping Use   Vaping status: Never Used  Substance Use Topics   Alcohol use: No   Drug use: No     Current Outpatient Medications on File Prior to Visit  Medication Sig Dispense Refill   acetaminophen (TYLENOL) 650 MG CR tablet Take 1,300 mg by mouth 2 (two) times daily.     amLODipine (NORVASC) 10 MG tablet Take 1 tablet (10 mg total) by mouth daily. 90 tablet 1   aspirin EC 81 MG tablet Take 1 tablet (81 mg total) by mouth daily. 90 tablet 3   atorvastatin (LIPITOR) 80 MG tablet Take 80 mg by mouth daily.     carvedilol (COREG) 12.5 MG tablet Take 1 tablet by mouth twice daily 180 tablet 0   citalopram (CELEXA) 40 MG tablet Take 40 mg by mouth daily.     cyclobenzaprine (FLEXERIL) 10 MG tablet Take 10 mg by mouth 3 (three) times daily as needed for muscle spasms. (Patient not taking: Reported on 10/08/2022)     EPINEPHrine 0.3 mg/0.3 mL IJ SOAJ injection Inject 0.3 mg into the muscle as needed for up to 1 dose for anaphylaxis. 2 each 0   furosemide (LASIX) 40 MG tablet TAKE 1 TABLET BY MOUTH ONCE DAILY *YOU MAY TAKE ONE ADDITIONAL TABLET ONCE DAILY AS NEEDED FOR SWELLING OR INCREASE SHORTNESS OF BREATH* 135 tablet 1   insulin aspart (NOVOLOG) 100 UNIT/ML injection Inject 10 Units into the skin 3 (three) times daily with meals.     insulin detemir (LEVEMIR) 100 UNIT/ML injection Inject 50 Units into the skin 2 (two) times daily. (Patient not taking: Reported on 10/08/2022)     LANTUS SOLOSTAR 100 UNIT/ML Solostar Pen Inject 40 Units into the skin 2 (two) times daily.     metFORMIN (GLUCOPHAGE-XR) 500 MG 24  hr tablet Take 500 mg by mouth 2 (two) times daily.     nitroGLYCERIN (NITROSTAT) 0.4 MG SL tablet PLACE 1 TABLET UNDER THE TONGUE EVERY 5 MINUTES AS NEEDED 25 tablet 3   predniSONE (DELTASONE) 10 MG tablet 4 tabs po day1,2; 3 tabs po day3,4; 2 tabs po day 5,6; 1 tab po day day 7,8; 1/2 tab po day9,10,11,12 (Patient not taking: Reported on 09/03/2022) 22 tablet 0   spironolactone (ALDACTONE) 25 MG tablet Take 1 tablet (25 mg total) by mouth daily. 90 tablet 3   traMADol (ULTRAM) 50 MG tablet Take 50-100 mg  by mouth every 8 (eight) hours as needed for pain.     warfarin (COUMADIN) 5 MG tablet TAKE 1 TO 2 TABLETS BY MOUTH ONCE DAILY OR AS DIRECTED BY THE COUMADIN CLINIC 90 tablet 1   No current facility-administered medications on file prior to visit.     Family Hx: The patient's family history includes CAD in his sister; CAD (age of onset: 29) in his father; CAD (age of onset: 81) in his mother; Heart attack in his father and mother.  ROS:   Please see the history of present illness.    Review of Systems  Constitutional: Negative.   HENT: Negative.    Respiratory: Negative.    Cardiovascular: Negative.   Gastrointestinal: Negative.   Musculoskeletal: Negative.   Neurological: Negative.   Psychiatric/Behavioral: Negative.    All other systems reviewed and are negative.    Labs/Other Tests and Data Reviewed:    Recent Labs: 05/26/2022: BUN 27; Creatinine, Ser 1.39; Hemoglobin 12.4; Platelets 228; Potassium 3.9; Sodium 137   Recent Lipid Panel Lab Results  Component Value Date/Time   CHOL 94 03/04/2017 08:15 PM   TRIG 117 03/04/2017 08:15 PM   HDL 35 (L) 03/04/2017 08:15 PM   CHOLHDL 2.7 03/04/2017 08:15 PM   LDLCALC 36 03/04/2017 08:15 PM    Wt Readings from Last 3 Encounters:  09/03/22 246 lb 9.6 oz (111.9 kg)  08/04/22 243 lb 12.8 oz (110.6 kg)  05/26/22 231 lb 7.7 oz (105 kg)     Exam:    Vital Signs: Vital signs may also be detailed in the HPI There were no vitals  taken for this visit.  Constitutional:  oriented to person, place, and time. No distress.  HENT:  Head: Grossly normal Eyes:  no discharge. No scleral icterus.  Neck: No JVD, no carotid bruits  Cardiovascular: Regular rate and rhythm, no murmurs appreciated Pulmonary/Chest: Clear to auscultation bilaterally, no wheezes or rails Abdominal: Soft.  no distension.  no tenderness.  Musculoskeletal: Normal range of motion Neurological:  normal muscle tone. Coordination normal. No atrophy Skin: Skin warm and dry Psychiatric: normal affect, pleasant  ASSESSMENT & PLAN:    Problem List Items Addressed This Visit   None   Ischemic cardiomyopathy Ejection fraction improved up to 50 to 55% on echo September 2022 We recommended he continue Entresto, carvedilol, spironolactone, Lasix He is declining cardiac rehab  Cad with chronic stable angina Stressed importance of weight loss, walking program, limiting his carbohydrate intake -We did discuss if shortness of breath gets worse we can order stress testing He prefers to work on his conditioning first  Diabetes type 2 poorly controlled complications A1c stable 7.2 We have encouraged continued exercise, careful diet management in an effort to lose weight.  Hyperlipidemia Cholesterol is at goal on the current lipid regimen. No changes to the medications were made.   Total encounter time more than 30 minutes  Greater than 50% was spent in counseling and coordination of care with the patient    Signed, Julien Nordmann, MD  [facility] [address], [place], [place] [address]

## 2022-11-17 ENCOUNTER — Ambulatory Visit: Payer: Medicare HMO | Admitting: Cardiovascular Disease

## 2022-11-17 DIAGNOSIS — I255 Ischemic cardiomyopathy: Secondary | ICD-10-CM

## 2022-11-17 DIAGNOSIS — Z8673 Personal history of transient ischemic attack (TIA), and cerebral infarction without residual deficits: Secondary | ICD-10-CM

## 2022-11-17 DIAGNOSIS — I5022 Chronic systolic (congestive) heart failure: Secondary | ICD-10-CM

## 2022-11-17 DIAGNOSIS — E1169 Type 2 diabetes mellitus with other specified complication: Secondary | ICD-10-CM

## 2022-11-17 DIAGNOSIS — I4891 Unspecified atrial fibrillation: Secondary | ICD-10-CM

## 2022-11-17 DIAGNOSIS — I509 Heart failure, unspecified: Secondary | ICD-10-CM

## 2022-11-17 DIAGNOSIS — E785 Hyperlipidemia, unspecified: Secondary | ICD-10-CM

## 2022-11-17 DIAGNOSIS — Z95810 Presence of automatic (implantable) cardiac defibrillator: Secondary | ICD-10-CM

## 2022-11-26 NOTE — Progress Notes (Signed)
No show

## 2022-12-07 ENCOUNTER — Ambulatory Visit: Payer: Medicare HMO | Attending: Internal Medicine

## 2022-12-07 DIAGNOSIS — Z95810 Presence of automatic (implantable) cardiac defibrillator: Secondary | ICD-10-CM | POA: Diagnosis not present

## 2022-12-07 DIAGNOSIS — I5022 Chronic systolic (congestive) heart failure: Secondary | ICD-10-CM | POA: Diagnosis not present

## 2022-12-11 ENCOUNTER — Ambulatory Visit (INDEPENDENT_AMBULATORY_CARE_PROVIDER_SITE_OTHER): Payer: Medicare HMO

## 2022-12-11 DIAGNOSIS — I255 Ischemic cardiomyopathy: Secondary | ICD-10-CM | POA: Diagnosis not present

## 2022-12-11 LAB — CUP PACEART REMOTE DEVICE CHECK
Battery Remaining Longevity: 45 mo
Battery Voltage: 2.96 V
Brady Statistic AP VP Percent: 56.66 %
Brady Statistic AP VS Percent: 0.5 %
Brady Statistic AS VP Percent: 42.31 %
Brady Statistic AS VS Percent: 0.53 %
Brady Statistic RA Percent Paced: 57.14 %
Brady Statistic RV Percent Paced: 77.3 %
Date Time Interrogation Session: 20241206001803
HighPow Impedance: 64 Ohm
Implantable Lead Connection Status: 753985
Implantable Lead Connection Status: 753985
Implantable Lead Connection Status: 753985
Implantable Lead Implant Date: 20200313
Implantable Lead Implant Date: 20200313
Implantable Lead Implant Date: 20200313
Implantable Lead Location: 753858
Implantable Lead Location: 753859
Implantable Lead Location: 753860
Implantable Lead Model: 4396
Implantable Lead Model: 5076
Implantable Lead Model: 6935
Implantable Pulse Generator Implant Date: 20200313
Lead Channel Impedance Value: 228 Ohm
Lead Channel Impedance Value: 304 Ohm
Lead Channel Impedance Value: 342 Ohm
Lead Channel Impedance Value: 399 Ohm
Lead Channel Impedance Value: 475 Ohm
Lead Channel Impedance Value: 779 Ohm
Lead Channel Pacing Threshold Amplitude: 0.625 V
Lead Channel Pacing Threshold Amplitude: 0.875 V
Lead Channel Pacing Threshold Amplitude: 1 V
Lead Channel Pacing Threshold Pulse Width: 0.4 ms
Lead Channel Pacing Threshold Pulse Width: 0.4 ms
Lead Channel Pacing Threshold Pulse Width: 0.4 ms
Lead Channel Sensing Intrinsic Amplitude: 1.375 mV
Lead Channel Sensing Intrinsic Amplitude: 1.375 mV
Lead Channel Sensing Intrinsic Amplitude: 7 mV
Lead Channel Sensing Intrinsic Amplitude: 7 mV
Lead Channel Setting Pacing Amplitude: 1.5 V
Lead Channel Setting Pacing Amplitude: 1.5 V
Lead Channel Setting Pacing Amplitude: 2 V
Lead Channel Setting Pacing Pulse Width: 0.4 ms
Lead Channel Setting Pacing Pulse Width: 0.4 ms
Lead Channel Setting Sensing Sensitivity: 0.3 mV
Zone Setting Status: 755011
Zone Setting Status: 755011

## 2022-12-11 NOTE — Progress Notes (Signed)
EPIC Encounter for ICM Monitoring  Patient Name: Adam Douglas is a 72 y.o. male Date: 12/11/2022 Primary Care Physican: Sherrin Daisy Danton Clap, MD Primary Cardiologist: Mariah Milling Electrophysiologist: Joycelyn Schmid Pacing: 98.9%         09/03/2022 Office Weight: 246 lbs                                                             Transmission reviewed.     Optivol thoracic impedance suggesting normal fluid levels within the last month.     Prescribed:   Furosemide 40 mg Take one tablet (40mg ) by mouth once daily, you may take one additional tablet (40mg ) once daily as needed for swelling or increase shortness of breath. Spironolactone 25 mg take 1 tablet (25 mg total) daily   Labs: 05/26/2022 Creatinine 1.39, BUN 27, Potassium 3.9, Sodium 137, GFR 54 A complete set of results can be found in Results Review.   Recommendations:  No changes.    Follow-up plan: ICM clinic phone appointment on 01/11/2023.    91 day device clinic remote transmission 03/12/2023.      EP/Cardiology Next Office Visit:  Recall 08/04/2023 with Dr Graciela Husbands.  Canceled 11/17/2022 with Dr Mariah Milling.   Copy of ICM check sent to Dr. Graciela Husbands.    3 month ICM trend: 12/07/2022.    12-14 Month ICM trend:     Karie Soda, RN 12/11/2022 9:16 AM

## 2023-01-11 ENCOUNTER — Ambulatory Visit: Payer: Medicare HMO | Attending: Internal Medicine

## 2023-01-11 DIAGNOSIS — I5022 Chronic systolic (congestive) heart failure: Secondary | ICD-10-CM | POA: Diagnosis not present

## 2023-01-11 DIAGNOSIS — Z95810 Presence of automatic (implantable) cardiac defibrillator: Secondary | ICD-10-CM

## 2023-01-14 ENCOUNTER — Emergency Department
Admission: EM | Admit: 2023-01-14 | Discharge: 2023-01-14 | Disposition: A | Discharge status: Home / Self Care | Payer: Medicare HMO | Attending: Emergency Medicine | Admitting: Emergency Medicine

## 2023-01-14 ENCOUNTER — Other Ambulatory Visit: Payer: Self-pay

## 2023-01-14 DIAGNOSIS — E119 Type 2 diabetes mellitus without complications: Secondary | ICD-10-CM | POA: Diagnosis not present

## 2023-01-14 DIAGNOSIS — M545 Low back pain, unspecified: Secondary | ICD-10-CM | POA: Diagnosis present

## 2023-01-14 DIAGNOSIS — M5442 Lumbago with sciatica, left side: Secondary | ICD-10-CM | POA: Insufficient documentation

## 2023-01-14 DIAGNOSIS — I10 Essential (primary) hypertension: Secondary | ICD-10-CM | POA: Diagnosis not present

## 2023-01-14 MED ORDER — OXYCODONE-ACETAMINOPHEN 5-325 MG PO TABS: 1.0000 | ORAL_TABLET | Freq: Four times a day (QID) | ORAL | 0 refills | Status: AC | PRN

## 2023-01-14 MED ORDER — HYDROMORPHONE HCL 1 MG/ML IJ SOLN
0.5000 mg | Freq: Once | INTRAMUSCULAR | Status: DC
  Filled 2023-01-14: qty 0.5

## 2023-01-14 MED ORDER — HYDROMORPHONE HCL 1 MG/ML IJ SOLN
0.5000 mg | Freq: Once | INTRAMUSCULAR | Status: AC
  Administered 2023-01-14: 0.5 mg via INTRAMUSCULAR

## 2023-01-14 MED ORDER — PREDNISONE 20 MG PO TABS: 40.0000 mg | ORAL_TABLET | Freq: Every day | ORAL | 0 refills | Status: AC

## 2023-01-14 MED ORDER — DEXAMETHASONE SODIUM PHOSPHATE 4 MG/ML IJ SOLN
4.0000 mg | Freq: Once | INTRAMUSCULAR | Status: AC
  Administered 2023-01-14: 4 mg via INTRAMUSCULAR
  Filled 2023-01-14: qty 1

## 2023-01-14 NOTE — ED Provider Triage Note (Signed)
 Emergency Medicine Provider Triage Evaluation Note  Adam Douglas , a 73 y.o. male  was evaluated in triage.  Pt complains of chronic back pain. Pain radiates into the legs, no numbness, weakness in left leg. Worsening of the pain started last week. No falls. No fevers, no loss of bladder or bowel function.   Review of Systems  Positive:  Negative:   Physical Exam  BP (!) 162/62 (BP Location: Right Arm)   Pulse 66   Temp 97.6 F (36.4 C) (Oral)   Resp 16   Ht 5' 10 (1.778 m)   Wt 111.1 kg   SpO2 98%   BMI 35.15 kg/m  Gen:   Awake, no distress   Resp:  Normal effort  MSK:   Moves extremities without difficulty  Other:    Medical Decision Making  Medically screening exam initiated at 2:06 PM.  Appropriate orders placed.  Adam Douglas was informed that the remainder of the evaluation will be completed by another provider, this initial triage assessment does not replace that evaluation, and the importance of remaining in the ED until their evaluation is complete.     Adam Douglas LABOR, PA-C 01/14/23 1408

## 2023-01-14 NOTE — Progress Notes (Signed)
 EPIC Encounter for ICM Monitoring  Patient Name: Adam Douglas is a 73 y.o. male Date: 01/14/2023 Primary Care Physican: [name] LABOR, MD Primary Cardiologist: Perla Electrophysiologist: Fernande Pore Pacing: 98.4%         09/03/2022 Office Weight: 246 lbs                                                             Transmission reviewed.     Optivol thoracic impedance suggesting normal fluid levels with the exception of possible fluid accumulation from 12/18-12/22.     Prescribed:   Furosemide  40 mg Take one tablet (40mg ) by mouth once daily, you may take one additional tablet (40mg ) once daily as needed for swelling or increase shortness of breath. Spironolactone  25 mg take 1 tablet (25 mg total) daily   Labs: 05/26/2022 Creatinine 1.39, BUN 27, Potassium 3.9, Sodium 137, GFR 54 A complete set of results can be found in Results Review.   Recommendations:  No changes.    Follow-up plan: ICM clinic phone appointment on 02/15/2023.    91 day device clinic remote transmission 03/12/2023.      EP/Cardiology Next Office Visit:  Recall 08/04/2023 with Dr Fernande.  Canceled 11/17/2022 with Dr Gollan.   Copy of ICM check sent to Dr. Fernande.    3 month ICM trend: 01/11/2023.    12-14 Month ICM trend:     Mitzie GORMAN Garner, RN 01/14/2023 1:30 PM

## 2023-01-14 NOTE — ED Triage Notes (Signed)
 Pt c/o back pain x1 week with no injury and increase in pain. Pt has hx of chronic back pain

## 2023-01-14 NOTE — Discharge Instructions (Addendum)
 Discontinue the hydrocodone  that you were prescribed a few days ago and start taking the oxycodone  instead.  Do not take with them at the same time.  Take the prednisone  for an additional 4 days starting tomorrow.  Follow-up with your primary care provider.  If the pain persist you may need imaging such as x-rays or a CT scan.  Return to the ER for new, worsening, or persistent severe pain, pain not relieved by any of the medications at home, any weakness or numbness in the leg, incontinence or inability urinate or have a bowel movement, abdominal pain, vomiting, fever, or any other new or worsening symptoms that concern you.

## 2023-01-14 NOTE — ED Provider Notes (Signed)
 [facility] Provider Note    Event Date/Time   First MD Initiated Contact with Patient 01/14/23 1446     (approximate)   History   Back Pain   HPI  Adam Douglas is a 73 y.o. male with a history of type 2 diabetes, hypertension, hyperlipidemia, and paroxysmal atrial fibrillation who presents with left-sided lower back pain for the last several weeks.  The patient states that the pain has been present since around December 20.  It is only in the midline and towards the left side.  It radiates down to the left leg.  He reports some tingling in that leg but denies any numbness or weakness.  He does not have any difficulty using the leg or putting weight on it.  He has no dysuria, frequency, hematuria, or any urinary or fecal incontinence or retention.  He denies any direct trauma or injury to the back.  The patient reports that he was seen at outside urgent care and prescribed a steroid and Flexeril with minimal relief.  Subsequently he was seen by his primary care provider a few days ago, given a steroid shot, and prescribed hydrocodone  also with little relief.  I reviewed the past medical records.  The patient has no other recent ED visits or admissions.  His most recent outpatient encounter that I have access to is with cardiology in 1/6 for review of his heart monitor.  Physical Exam   Triage Vital Signs: ED Triage Vitals  Encounter Vitals Group     BP 01/14/23 1403 (!) 162/62     Systolic BP Percentile --      Diastolic BP Percentile --      Pulse Rate 01/14/23 1403 66     Resp 01/14/23 1403 16     Temp 01/14/23 1403 97.6 F (36.4 C)     Temp Source 01/14/23 1403 Oral     SpO2 01/14/23 1403 98 %     Weight 01/14/23 1404 245 lb (111.1 kg)     Height 01/14/23 1404 5' 10 (1.778 m)     Head Circumference --      Peak Flow --      Pain Score 01/14/23 1404 6     Pain Loc --      Pain Education --      Exclude from Growth Chart --     Most recent  vital signs: Vitals:   01/14/23 1403  BP: (!) 162/62  Pulse: 66  Resp: 16  Temp: 97.6 F (36.4 C)  SpO2: 98%     General: Awake, no distress.  CV:  Good peripheral perfusion.  Resp:  Normal effort.  Abd:  No distention.  Other:  5/5 motor strength intact sensation to bilateral lower extremities, proximal and distal.  No midline spinal tenderness.  Left lumbar paraspinal muscle tenderness.   ED Results / Procedures / Treatments   Labs (all labs ordered are listed, but only abnormal results are displayed) Labs Reviewed - No data to display   EKG   RADIOLOGY   PROCEDURES:  Critical Care performed: No  Procedures   MEDICATIONS ORDERED IN ED: Medications  dexamethasone  (DECADRON ) injection 4 mg (4 mg Intramuscular Given 01/14/23 1508)  HYDROmorphone  (DILAUDID ) injection 0.5 mg (0.5 mg Intramuscular Given 01/14/23 1508)     IMPRESSION / MDM / ASSESSMENT AND PLAN / ED COURSE  I reviewed the triage vital signs and the nursing notes.  73 year old male with PMH as noted above presents with persistent  left lower back pain over the last several weeks, with minimal relief with a muscle relaxant, steroid shot, and hydrocodone .  On exam the patient is overall well-appearing with normal vital signs other than hypertension.  There are no neuro deficits to the left lower extremity and no midline spinal tenderness.  The patient has no red flag symptoms.    Differential diagnosis includes, but is not limited to, lumbar radiculopathy, muscle strain or spasm.  The patient has no findings to suggest cauda equina syndrome.  Given the normal neuroexam and the fact that he has not had any acute trauma, there is no indication for emergent imaging in the ED.  Patient's presentation is most consistent with acute, uncomplicated illness.  I had an extensive discussion with the patient about the likely etiologies of his symptoms and the plan of care.  We will give a dose of Decadron  here as well  as IM Dilaudid  for pain relief.  I reviewed the patient's record in the PDMP.  I instructed him to discontinue the hydrocodone  that was prescribed a few days ago, and have replaced with a prescription for oxycodone , as well as additional short steroid course.  I gave the patient and his family member strict return precautions and they expressed understanding.  They will follow-up with her primary care provider.  FINAL CLINICAL IMPRESSION(S) / ED DIAGNOSES   Final diagnoses:  Acute left-sided low back pain with left-sided sciatica     Rx / DC Orders   ED Discharge Orders          Ordered    predniSONE  (DELTASONE ) 20 MG tablet  Daily        01/14/23 1519    oxyCODONE -acetaminophen  (PERCOCET) 5-325 MG tablet  Every 6 hours PRN        01/14/23 1519             Note:  This document was prepared using Dragon voice recognition software and may include unintentional dictation errors.    Jacolyn Pae, MD 01/14/23 [id number]

## 2023-01-15 ENCOUNTER — Other Ambulatory Visit: Payer: Self-pay | Admitting: Internal Medicine

## 2023-01-15 DIAGNOSIS — M5442 Lumbago with sciatica, left side: Secondary | ICD-10-CM

## 2023-01-22 ENCOUNTER — Ambulatory Visit: Payer: Medicare HMO

## 2023-01-26 ENCOUNTER — Other Ambulatory Visit: Payer: Self-pay | Admitting: Internal Medicine

## 2023-01-26 DIAGNOSIS — M5442 Lumbago with sciatica, left side: Secondary | ICD-10-CM

## 2023-02-02 NOTE — Progress Notes (Signed)
Patient for DG Lumbar Myelogram Inj/CT Lumbar Myelogram on Wed 02/03/2023, I called and spoke with the patient's wife, Lynden Ang on the phone and gave pre-procedure instructions. Lynden Ang was made aware to have the patient here at 10:30a and check in at the new entrance. Lynden Ang stated understanding.  Called 02/02/2023

## 2023-02-03 ENCOUNTER — Ambulatory Visit
Admission: RE | Admit: 2023-02-03 | Discharge: 2023-02-03 | Disposition: A | Discharge status: Home / Self Care | Payer: Medicare HMO | Source: Ambulatory Visit | Attending: Internal Medicine

## 2023-02-03 ENCOUNTER — Other Ambulatory Visit: Payer: Self-pay

## 2023-02-03 ENCOUNTER — Emergency Department
Admission: EM | Admit: 2023-02-03 | Discharge: 2023-02-03 | Disposition: A | Discharge status: Home / Self Care | Payer: Medicare HMO | Attending: Emergency Medicine | Admitting: Emergency Medicine

## 2023-02-03 ENCOUNTER — Ambulatory Visit
Admission: RE | Admit: 2023-02-03 | Discharge: 2023-02-03 | Disposition: A | Discharge status: Home / Self Care | Payer: Medicare HMO | Source: Ambulatory Visit | Attending: Internal Medicine | Admitting: Internal Medicine

## 2023-02-03 ENCOUNTER — Emergency Department: Payer: Medicare HMO

## 2023-02-03 DIAGNOSIS — M5442 Lumbago with sciatica, left side: Secondary | ICD-10-CM | POA: Diagnosis present

## 2023-02-03 DIAGNOSIS — T508X5A Adverse effect of diagnostic agents, initial encounter: Secondary | ICD-10-CM | POA: Insufficient documentation

## 2023-02-03 DIAGNOSIS — R519 Headache, unspecified: Secondary | ICD-10-CM | POA: Insufficient documentation

## 2023-02-03 DIAGNOSIS — M47816 Spondylosis without myelopathy or radiculopathy, lumbar region: Secondary | ICD-10-CM | POA: Insufficient documentation

## 2023-02-03 DIAGNOSIS — M5126 Other intervertebral disc displacement, lumbar region: Secondary | ICD-10-CM | POA: Insufficient documentation

## 2023-02-03 DIAGNOSIS — I700 Atherosclerosis of aorta: Secondary | ICD-10-CM | POA: Diagnosis not present

## 2023-02-03 DIAGNOSIS — M48061 Spinal stenosis, lumbar region without neurogenic claudication: Secondary | ICD-10-CM | POA: Insufficient documentation

## 2023-02-03 DIAGNOSIS — R55 Syncope and collapse: Secondary | ICD-10-CM | POA: Insufficient documentation

## 2023-02-03 LAB — BASIC METABOLIC PANEL
Anion gap: 10 (ref 5–15)
BUN: 26 mg/dL — ABNORMAL HIGH (ref 8–23)
CO2: 23 mmol/L (ref 22–32)
Calcium: 9 mg/dL (ref 8.9–10.3)
Chloride: 106 mmol/L (ref 98–111)
Creatinine, Ser: 1.25 mg/dL — ABNORMAL HIGH (ref 0.61–1.24)
GFR, Estimated: >60 mL/min (ref >60)
Glucose, Bld: 119 mg/dL — ABNORMAL HIGH (ref 70–99)
Potassium: 3.5 mmol/L (ref 3.5–5.1)
Sodium: 139 mmol/L (ref 135–145)

## 2023-02-03 LAB — CBC
HCT: 35.6 % — ABNORMAL LOW (ref 39.0–52.0)
Hemoglobin: 12.6 g/dL — ABNORMAL LOW (ref 13.0–17.0)
MCH: 32 pg (ref 26.0–34.0)
MCHC: 35.4 g/dL (ref 30.0–36.0)
MCV: 90.4 fL (ref 80.0–100.0)
Platelets: 225 10*3/uL (ref 150–400)
RBC: 3.94 MIL/uL — ABNORMAL LOW (ref 4.22–5.81)
RDW: 13.1 % (ref 11.5–15.5)
WBC: 9.3 10*3/uL (ref 4.0–10.5)
nRBC: 0 % (ref 0.0–0.2)

## 2023-02-03 LAB — GLUCOSE, CAPILLARY: Glucose-Capillary: 156 mg/dL — ABNORMAL HIGH (ref 70–99)

## 2023-02-03 MED ORDER — IOHEXOL 180 MG/ML  SOLN
20.0000 mL | Freq: Once | INTRAMUSCULAR | Status: AC | PRN
  Administered 2023-02-03: 20 mL

## 2023-02-03 MED ORDER — LIDOCAINE HCL (PF) 1 % IJ SOLN
10.0000 mL | Freq: Once | INTRAMUSCULAR | Status: AC
  Administered 2023-02-03: 10 mL
  Filled 2023-02-03: qty 10

## 2023-02-03 NOTE — Progress Notes (Signed)
   02/03/23 1100  Spiritual Encounters  Type of Visit Initial  Care provided to: Pt and family  Conversation partners present during encounter Nurse  Referral source Other (comment) (Rapid Response)  Reason for visit Code  OnCall Visit Yes  Interventions  Spiritual Care Interventions Made Established relationship of care and support;Compassionate presence;Prayer;Encouragement  Intervention Outcomes  Outcomes Connection to spiritual care;Awareness around self/spiritual resourses  Spiritual Care Plan  Spiritual Care Issues Still Outstanding No further spiritual care needs at this time (see row info)

## 2023-02-03 NOTE — Procedures (Signed)
Patient with low back pain, left sided sciatica who presented to radiology today for outpatient lumbar myelogram.  Initially myelogram was attempted at L2-3 however after several attempts at repositioning unable to have return of CSF. Second attempt was made at L4-5 and access was gained to the thecal sac with return of clear, colorless CSF. The patient was conversant throughout the procedure.   Following administration of approximately 15 cc of Omnipaque 180 into the thecal sac the patient's eyes rolled back, he had snoring respirations and he had seizure-like activity (shaking/jerking of arms/legs) and was not responding to verbal cues for approximately 45-60 seconds. No apparent loss of bowel or bladder control. No vomiting. Radial pulse remained palpable and respirations were unlabored, as such CPR was not initiated. A rapid response was immediately called and the patient was placed in the right lateral position for airway protection. At that time the patient was drowsy but able to tell me his name, birthday, where he is and what he was here for. He stated he did not remember what happened but remembers the procedure beforehand. He denies a history of seizures or contrast allergy, although he is not sure that he has had contrast prior to today. He is unsure what medications he takes but thinks he takes something for HTN. 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  Patient was hypertensive, mildly tachycardic with spO2 90-92% on room air throughout the duration of the rapid response. He denied any complaints except feeling hot.  The patient was taken to CT for completion of CT lumbar spine post myelogram and will be taken to the ED following that for further evaluation.  Discussed with patient and his wife Lynden Ang who state understanding.  Please see imaging section for full procedural dictation.  Lynnette Caffey, PA-C

## 2023-02-03 NOTE — ED Provider Notes (Addendum)
[facility] Provider Note   Event Date/Time   First MD Initiated Contact with Patient 02/03/23 1353     (approximate) History  Seizures  HPI Adam Douglas is a 73 y.o. male with a history of ruptured vertebral disc who presents from a myelogram that was being performed today complaining of headache after an episode of of unconsciousness after getting a contrast load.  Staff also notes some shaking activity to the upper and lower extremities immediately following a contrast bolus.  Patient states that he feels fine at this time and only complains of a headache as well as his chronic back pain ROS: Patient currently denies any vision changes, tinnitus, difficulty speaking, facial droop, sore throat, chest pain, shortness of breath, abdominal pain, nausea/vomiting/diarrhea, dysuria, or weakness/numbness/paresthesias in any extremity   Physical Exam  Triage Vital Signs: ED Triage Vitals [02/03/23 1243]  Encounter Vitals Group     BP (!) 183/65     Systolic BP Percentile      Diastolic BP Percentile      Pulse Rate 66     Resp 16     Temp 98.2 F (36.8 C)     Temp Source Oral     SpO2 95 %     Weight 230 lb (104.3 kg)     Height 5\' 8"  (1.727 m)     Head Circumference      Peak Flow      Pain Score 0     Pain Loc      Pain Education      Exclude from Growth Chart    Most recent vital signs: Vitals:   02/03/23 1243 02/03/23 1506  BP: (!) 183/65 (!) 162/73  Pulse: 66 65  Resp: 16 11  Temp: 98.2 F (36.8 C) 97.8 F (36.6 C)  SpO2: 95% 95%   General: Awake, oriented x4. CV:  Good peripheral perfusion.  Resp:  Normal effort.  Abd:  No distention.  Other:  Elderly overweight Caucasian male resting comfortably in no acute distress ED Results / Procedures / Treatments  Labs (all labs ordered are listed, but only abnormal results are displayed) Labs Reviewed  BASIC METABOLIC PANEL - Abnormal; Notable for the following components:      Result Value    Glucose, Bld 119 (*)    BUN 26 (*)    Creatinine, Ser 1.25 (*)    All other components within normal limits  CBC - Abnormal; Notable for the following components:   RBC 3.94 (*)    Hemoglobin 12.6 (*)    HCT 35.6 (*)    All other components within normal limits   EKG ED ECG REPORT I, Merwyn Katos, the attending physician, personally viewed and interpreted this ECG. Date: 02/03/2023 EKG Time: 1237 Rate: 63 Rhythm: Paced rhythm QRS Axis: normal Intervals: normal ST/T Wave abnormalities: normal Narrative Interpretation: no evidence of acute ischemia RADIOLOGY ED MD interpretation: CT of the head without contrast independently interpreted and shows multiple foci of pneumocephalus in the ventricles and subarachnoid spaces -Agree with radiology assessment Official radiology report(s): CT LUMBAR SPINE W CONTRAST Result Date: 02/03/2023 CLINICAL DATA:  Postmyelogram images EXAM: CT LUMBAR SPINE WITH CONTRAST TECHNIQUE: Multidetector CT imaging of the lumbar spine was performed with intravenous contrast administration. RADIATION DOSE REDUCTION: This exam was performed according to the departmental dose-optimization program which includes automated exposure control, adjustment of the mA and/or kV according to patient size and/or use of iterative reconstruction technique. CONTRAST:  20  mL Omnipaque 180 COMPARISON:  No prior CT of the lumbar spine. FINDINGS: Segmentation: 5 lumbar type vertebral bodies. The last fully formed disc space is labeled L5-S1. Alignment: Preservation of the normal lumbar lordosis. 4 mm anterolisthesis of L4 on L5. 3 mm retrolisthesis of L5 on S1. Minimal dextrocurvature. Vertebrae: No acute fracture or focal pathologic process. Endplate degenerative changes, most prominently at L5-S1. Paraspinal and other soft tissues: Aortic atherosclerosis and branch vessel calcifications. No lymphadenopathy. Disc levels: Evaluation is somewhat complicated by contrast in the subdural  space and air within the thecal sac. The subdural contrast appears to cause some thecal sac narrowing. T12-L1: No significant disc bulge. No spinal canal stenosis or neural foraminal narrowing. L1-L2: Minimal disc bulge. Moderate facet hypertrophy and ligamentum flavum hypertrophy. Moderate spinal canal stenosis. Mild right neural foraminal narrowing. L2-L3: Mild disc bulge. Mild facet arthropathy. Mild thecal sac narrowing due to subdural contrast but likely no more than mild spinal canal stenosis in this location. No neural foraminal narrowing. L3-L4: Mild disc bulge with small central disc protrusion. Mild facet arthropathy. Subdural contrast contributes to some thecal sac narrowing, but there is likely no actual spinal canal stenosis at this level. Mild bilateral neural foraminal narrowing. L4-L5: Grade 1 anterolisthesis with disc unroofing and central/left paracentral disc extrusion with 13 mm of cranial migration (series 5, image 50 and series 3, image 89), which likely contacts the descending left L4 nerve roots posterior to the L4 vertebral body. Moderate to severe facet arthropathy. Effacement of the lateral recesses. Moderate to severe spinal canal stenosis, which is somewhat accentuated by subdural contrast. Severe left and moderate to severe right neural foraminal narrowing. L5-S1: Retrolisthesis and moderate disc bulge. Moderate facet arthropathy. Subdural contrast causes significant thecal sac narrowing at this level, however the overall size of the spinal canal is likely does not result in significant spinal canal stenosis. Moderate to severe bilateral neural foraminal narrowing. IMPRESSION: 1. Evaluation is somewhat complicated by contrast in the subdural space and air within the thecal sac. The subdural contrast appears to cause some thecal sac narrowing, which limits evaluation of spinal canal stenosis. 2. L4-L5 moderate to severe spinal canal stenosis, which is somewhat accentuated by subdural  contrast. Severe left and moderate to severe right neural foraminal narrowing. Central/left paracentral disc extrusion with 13 mm of cranial migration, which likely contacts the descending left L4 nerve roots posterior to the L4 vertebral body. 3. L1-L2 moderate spinal canal stenosis and mild right neural foraminal narrowing. 4. L5-S1 moderate to severe bilateral neural foraminal narrowing. 5. Aortic atherosclerosis. Aortic Atherosclerosis (ICD10-I70.0). Electronically Signed   By: Wiliam Ke M.D.   On: 02/03/2023 13:48   DG FL GUIDED LP INJECT MYELOGRAM LUMBAR Result Date: 02/03/2023 CLINICAL DATA:  Patient with history of chronic low back pain, left sided sciatica. Request for myelogram prior to CT lumbar spine for further evaluation. EXAM: LUMBAR MYELOGRAM UNDER FLUOROSCOPY FLUOROSCOPY: Radiation exposure index: 28.10 mGy reference air kerma TECHNIQUE: The patient was positioned prone on the fluoroscopy table. An appropriate skin entry site was determined under fluoroscopy and marked. The operator donned sterile gloves and a mask. The lower back was prepped and draped in the usual sterile fashion. Local anesthesia was provided with 1% lidocaine. Under intermittent fluoroscopy, lumbar puncture was initially performed at the L2-3 level without return of CSF despite needle repositioning. The needle was removed and a new 20 gauge needle was advanced into the thecal sac at L4-5 with return of clear, colorless CSF. Subsequently, 15 mL  of Omnipaque 180 contrast was slowly hand injected into the thecal sac under intermittent fluoroscopy. During the end of contrast administration the patient's eyes rolled back and he began to have snoring respirations with seizure-like activity including jerking/shaking of arms and legs bilaterally for approximately 45 - 60 seconds. The stylet was replaced and the needle was immediately removed. He did not respond to verbal cues at that time however pulse remained palpable and  respirations unlabored, as such CPR was not initiated. Rapid response was immediately called and the patient was placed in the right lateral position on the fluoroscopy table for airway protection. Approximately 1 minute after cessation of seizure-like activity the patient was drowsy but able to correctly tell me is name, birth date, hospital name and that he came here today for a procedure on his back. He denied history of seizures or contrast allergy. After the patient was stabilized he was taken to CT for completion of CT lumbar spine and he was then taken to the ED for further evaluation. Above was discussed with patient and his wife Evin Loiseau who state understanding of the plan. COMPLICATIONS: Seizure-like activity following administration of intra-thecal contrast as above. IMPRESSION: Technically successful lumbar myelogram with adverse event of seizure-like activity during administration of intra-thecal contrast. Performed by Lynnette Caffey, PA-C and Malachy Moan, MD Electronically Signed   By: Malachy Moan M.D.   On: 02/03/2023 13:37   CT Head Wo Contrast Result Date: 02/03/2023 CLINICAL DATA:  Seizure like activity EXAM: CT HEAD WITHOUT CONTRAST TECHNIQUE: Contiguous axial images were obtained from the base of the skull through the vertex without intravenous contrast. RADIATION DOSE REDUCTION: This exam was performed according to the departmental dose-optimization program which includes automated exposure control, adjustment of the mA and/or kV according to patient size and/or use of iterative reconstruction technique. COMPARISON:  None Available. FINDINGS: Brain: Contrast in the subarachnoid spaces from recent myelogram, with multiple foci of pneumocephalus, which are in the ventricles subarachnoid spaces. No evidence of acute infarction, hemorrhage, mass, mass effect, or midline shift. No hydrocephalus or extra-axial fluid collection. Vascular: No hyperdense vessel. Skull: Negative for  fracture or focal lesion. Sinuses/Orbits: No acute finding. Other: The mastoid air cells are well aerated. IMPRESSION: Multiple foci of pneumocephalus in the ventricles and subarachnoid spaces, which are not normal following a myelogram. Contrast within the subarachnoid spaces is not unexpected following a myelogram. No additional acute intracranial process. These results were called by telephone at the time of interpretation on 02/03/2023 at 1:28 pm to provider Greig Right , who verbally acknowledged these results. Electronically Signed   By: Wiliam Ke M.D.   On: 02/03/2023 13:30   PROCEDURES: Critical Care performed: No Procedures MEDICATIONS ORDERED IN ED: Medications - No data to display IMPRESSION / MDM / ASSESSMENT AND PLAN / ED COURSE  I reviewed the triage vital signs and the nursing notes.                             The patient is on the cardiac monitor to evaluate for evidence of arrhythmia and/or significant heart rate changes. Patient's presentation is most consistent with acute presentation with potential threat to life or bodily function. Patient presents with complaints of syncope/presyncope ED Workup:  CBC, BMP, Troponin, BNP, ECG, CXR Differential diagnosis includes HF, ICH, seizure, stroke, HOCM, ACS, aortic dissection, malignant arrhythmia, or GI bleed. Findings: No evidence of acute laboratory abnormalities.  Troponin negative x1 EKG: No  e/o STEMI. No evidence of Brugada's sign, delta wave, epsilon wave, significantly prolonged QTc, or malignant arrhythmia. CT showing pneumocephalus.  I spoke to Dr. Katrinka Blazing in neurosurgery who agrees that this pneumocephalus is common after a myelogram especially as patient may have had shaking activity and cause negative pressure.  Recommend patient is safe for discharge at this time and to use Tylenol as needed for any continued headache Disposition: Discharge. Patient is at baseline at this time. Return precautions expressed and  understood in person. Advised follow up with primary care provider or clinic physician in next 24 hours.   FINAL CLINICAL IMPRESSION(S) / ED DIAGNOSES   Final diagnoses:  Contrast media adverse reaction, initial encounter  Syncope, unspecified syncope type   Rx / DC Orders   ED Discharge Orders     None      Note:  This document was prepared using Dragon voice recognition software and may include unintentional dictation errors.   Merwyn Katos, MD 02/03/23 1553    Merwyn Katos, MD 02/03/23 [phone]

## 2023-02-03 NOTE — ED Triage Notes (Addendum)
Pt was here in the hospital as a pt and had a myelogram, after the procedure was performed staff reported seizure like activity, pt did not lose continence of urine and does not have bite marks to tongue, pt's wife denies that the pt has a hx of seizures, pt denies any complaints at this time, pt is currently lying on a stretcher and will require lying flat post procedure for the next 2 hours  This RN spoke with the APP who performed the myelogram and she states that after the contrast had been injected, pt's eyes rolled back in his head, he hard bilat arm and leg jerking with snoring resp lasting approx 45 sec, pt came to and seemed sleepy but was able to answer questions, pt does not recall this event happening

## 2023-02-05 ENCOUNTER — Encounter: Payer: Self-pay | Admitting: Emergency Medicine

## 2023-02-05 ENCOUNTER — Observation Stay
Admission: EM | Admit: 2023-02-05 | Discharge: 2023-02-06 | Disposition: A | Discharge status: Home / Self Care | Payer: Medicare HMO | Attending: Internal Medicine | Admitting: Internal Medicine

## 2023-02-05 ENCOUNTER — Other Ambulatory Visit: Payer: Self-pay

## 2023-02-05 DIAGNOSIS — Z8673 Personal history of transient ischemic attack (TIA), and cerebral infarction without residual deficits: Secondary | ICD-10-CM | POA: Diagnosis not present

## 2023-02-05 DIAGNOSIS — I4891 Unspecified atrial fibrillation: Secondary | ICD-10-CM | POA: Diagnosis present

## 2023-02-05 DIAGNOSIS — I2510 Atherosclerotic heart disease of native coronary artery without angina pectoris: Secondary | ICD-10-CM | POA: Insufficient documentation

## 2023-02-05 DIAGNOSIS — I110 Hypertensive heart disease with heart failure: Secondary | ICD-10-CM | POA: Insufficient documentation

## 2023-02-05 DIAGNOSIS — Z950 Presence of cardiac pacemaker: Secondary | ICD-10-CM | POA: Insufficient documentation

## 2023-02-05 DIAGNOSIS — Z7982 Long term (current) use of aspirin: Secondary | ICD-10-CM | POA: Insufficient documentation

## 2023-02-05 DIAGNOSIS — Z87891 Personal history of nicotine dependence: Secondary | ICD-10-CM | POA: Diagnosis not present

## 2023-02-05 DIAGNOSIS — I5022 Chronic systolic (congestive) heart failure: Secondary | ICD-10-CM | POA: Diagnosis present

## 2023-02-05 DIAGNOSIS — I10 Essential (primary) hypertension: Secondary | ICD-10-CM | POA: Diagnosis present

## 2023-02-05 DIAGNOSIS — E785 Hyperlipidemia, unspecified: Secondary | ICD-10-CM | POA: Diagnosis not present

## 2023-02-05 DIAGNOSIS — E1169 Type 2 diabetes mellitus with other specified complication: Secondary | ICD-10-CM | POA: Diagnosis not present

## 2023-02-05 DIAGNOSIS — Z79899 Other long term (current) drug therapy: Secondary | ICD-10-CM | POA: Insufficient documentation

## 2023-02-05 DIAGNOSIS — Z7984 Long term (current) use of oral hypoglycemic drugs: Secondary | ICD-10-CM | POA: Insufficient documentation

## 2023-02-05 DIAGNOSIS — I5042 Chronic combined systolic (congestive) and diastolic (congestive) heart failure: Secondary | ICD-10-CM | POA: Diagnosis not present

## 2023-02-05 DIAGNOSIS — Z7901 Long term (current) use of anticoagulants: Secondary | ICD-10-CM | POA: Diagnosis not present

## 2023-02-05 DIAGNOSIS — Z794 Long term (current) use of insulin: Secondary | ICD-10-CM | POA: Diagnosis not present

## 2023-02-05 DIAGNOSIS — T783XXA Angioneurotic edema, initial encounter: Secondary | ICD-10-CM | POA: Diagnosis not present

## 2023-02-05 LAB — CBC WITH DIFFERENTIAL/PLATELET
Abs Immature Granulocytes: 0.02 10*3/uL (ref 0.00–0.07)
Basophils Absolute: 0 10*3/uL (ref 0.0–0.1)
Basophils Relative: 1 %
Eosinophils Absolute: 0.2 10*3/uL (ref 0.0–0.5)
Eosinophils Relative: 2 %
HCT: 42.4 % (ref 39.0–52.0)
Hemoglobin: 14.6 g/dL (ref 13.0–17.0)
Immature Granulocytes: 0 %
Lymphocytes Relative: 31 %
Lymphs Abs: 2.7 10*3/uL (ref 0.7–4.0)
MCH: 31.8 pg (ref 26.0–34.0)
MCHC: 34.4 g/dL (ref 30.0–36.0)
MCV: 92.4 fL (ref 80.0–100.0)
Monocytes Absolute: 0.6 10*3/uL (ref 0.1–1.0)
Monocytes Relative: 7 %
Neutro Abs: 5.3 10*3/uL (ref 1.7–7.7)
Neutrophils Relative %: 59 %
Platelets: 284 10*3/uL (ref 150–400)
RBC: 4.59 MIL/uL (ref 4.22–5.81)
RDW: 13 % (ref 11.5–15.5)
WBC: 8.9 10*3/uL (ref 4.0–10.5)
nRBC: 0 % (ref 0.0–0.2)

## 2023-02-05 LAB — CBC
HCT: 37 % — ABNORMAL LOW (ref 39.0–52.0)
Hemoglobin: 12.8 g/dL — ABNORMAL LOW (ref 13.0–17.0)
MCH: 32 pg (ref 26.0–34.0)
MCHC: 34.6 g/dL (ref 30.0–36.0)
MCV: 92.5 fL (ref 80.0–100.0)
Platelets: 220 10*3/uL (ref 150–400)
RBC: 4 MIL/uL — ABNORMAL LOW (ref 4.22–5.81)
RDW: 13 % (ref 11.5–15.5)
WBC: 17.1 10*3/uL — ABNORMAL HIGH (ref 4.0–10.5)
nRBC: 0 % (ref 0.0–0.2)

## 2023-02-05 LAB — COMPREHENSIVE METABOLIC PANEL
ALT: 22 U/L (ref 0–44)
AST: 28 U/L (ref 15–41)
Albumin: 3.8 g/dL (ref 3.5–5.0)
Alkaline Phosphatase: 75 U/L (ref 38–126)
Anion gap: 12 (ref 5–15)
BUN: 21 mg/dL (ref 8–23)
CO2: 22 mmol/L (ref 22–32)
Calcium: 8.9 mg/dL (ref 8.9–10.3)
Chloride: 104 mmol/L (ref 98–111)
Creatinine, Ser: 1.23 mg/dL (ref 0.61–1.24)
GFR, Estimated: >60 mL/min (ref >60)
Glucose, Bld: 242 mg/dL — ABNORMAL HIGH (ref 70–99)
Potassium: 3.4 mmol/L — ABNORMAL LOW (ref 3.5–5.1)
Sodium: 138 mmol/L (ref 135–145)
Total Bilirubin: 0.7 mg/dL (ref 0.0–1.2)
Total Protein: 7.3 g/dL (ref 6.5–8.1)

## 2023-02-05 LAB — CREATININE, SERUM
Creatinine, Ser: 1.16 mg/dL (ref 0.61–1.24)
GFR, Estimated: >60 mL/min (ref >60)

## 2023-02-05 LAB — GLUCOSE, CAPILLARY: Glucose-Capillary: 285 mg/dL — ABNORMAL HIGH (ref 70–99)

## 2023-02-05 LAB — TROPONIN I (HIGH SENSITIVITY)
Troponin I (High Sensitivity): 20 ng/L — ABNORMAL HIGH (ref <18)
Troponin I (High Sensitivity): 22 ng/L — ABNORMAL HIGH (ref <18)

## 2023-02-05 LAB — HEMOGLOBIN A1C
Hgb A1c MFr Bld: 7.7 % — ABNORMAL HIGH (ref 4.8–5.6)
Mean Plasma Glucose: 174.29 mg/dL

## 2023-02-05 LAB — PROTIME-INR
INR: 1.2 (ref 0.8–1.2)
Prothrombin Time: 15.2 s (ref 11.4–15.2)

## 2023-02-05 MED ORDER — WARFARIN - PHARMACIST DOSING INPATIENT
Freq: Every day | Status: DC
  Filled 2023-02-05: qty 1

## 2023-02-05 MED ORDER — FAMOTIDINE IN NACL 20-0.9 MG/50ML-% IV SOLN
20.0000 mg | Freq: Once | INTRAVENOUS | Status: AC
  Administered 2023-02-05: 20 mg via INTRAVENOUS
  Filled 2023-02-05: qty 50

## 2023-02-05 MED ORDER — ONDANSETRON HCL 4 MG/2ML IJ SOLN: 4.0000 mg | Freq: Four times a day (QID) | INTRAMUSCULAR | Status: DC | PRN

## 2023-02-05 MED ORDER — ENOXAPARIN SODIUM 60 MG/0.6ML IJ SOSY: 50.0000 mg | PREFILLED_SYRINGE | INTRAMUSCULAR | Status: DC

## 2023-02-05 MED ORDER — METHYLPREDNISOLONE SODIUM SUCC 125 MG IJ SOLR
125.0000 mg | Freq: Once | INTRAMUSCULAR | Status: AC
Start: 2023-02-05 — End: 2023-02-05
  Administered 2023-02-05: 125 mg via INTRAVENOUS
  Filled 2023-02-05: qty 2

## 2023-02-05 MED ORDER — SODIUM CHLORIDE 0.9 % IV BOLUS
1000.0000 mL | Freq: Once | INTRAVENOUS | Status: AC
  Administered 2023-02-05: 1000 mL via INTRAVENOUS

## 2023-02-05 MED ORDER — LORATADINE 10 MG PO TABS: 20.0000 mg | ORAL_TABLET | Freq: Every day | ORAL | Status: DC

## 2023-02-05 MED ORDER — POLYETHYLENE GLYCOL 3350 17 G PO PACK
17.0000 g | PACK | Freq: Every day | ORAL | Status: DC
  Administered 2023-02-06: 17 g via ORAL
  Filled 2023-02-05: qty 1

## 2023-02-05 MED ORDER — PREDNISONE 20 MG PO TABS
40.0000 mg | ORAL_TABLET | Freq: Every day | ORAL | Status: DC
  Administered 2023-02-06: 40 mg via ORAL
  Filled 2023-02-05: qty 4

## 2023-02-05 MED ORDER — ONDANSETRON HCL 4 MG PO TABS: 4.0000 mg | ORAL_TABLET | Freq: Four times a day (QID) | ORAL | Status: DC | PRN

## 2023-02-05 MED ORDER — EPINEPHRINE 0.3 MG/0.3ML IJ SOAJ
0.3000 mg | Freq: Once | INTRAMUSCULAR | Status: AC
Start: 2023-02-05 — End: 2023-02-05
  Administered 2023-02-05: 0.3 mg via INTRAMUSCULAR
  Filled 2023-02-05: qty 0.3

## 2023-02-05 MED ORDER — ENOXAPARIN SODIUM 40 MG/0.4ML IJ SOSY: 40.0000 mg | PREFILLED_SYRINGE | INTRAMUSCULAR | Status: DC

## 2023-02-05 MED ORDER — WARFARIN SODIUM 1 MG PO TABS
1.0000 mg | ORAL_TABLET | Freq: Once | ORAL | Status: AC
Start: 2023-02-05 — End: 2023-02-05
  Administered 2023-02-05: 1 mg via ORAL
  Filled 2023-02-05 (×2): qty 1

## 2023-02-05 MED ORDER — LORATADINE 10 MG PO TABS
20.0000 mg | ORAL_TABLET | Freq: Every day | ORAL | Status: AC
  Administered 2023-02-05 – 2023-02-06 (×2): 20 mg via ORAL
  Filled 2023-02-05 (×2): qty 2

## 2023-02-05 MED ORDER — DIPHENHYDRAMINE HCL 50 MG/ML IJ SOLN
25.0000 mg | Freq: Once | INTRAMUSCULAR | Status: AC
Start: 2023-02-05 — End: 2023-02-05
  Administered 2023-02-05: 25 mg via INTRAVENOUS
  Filled 2023-02-05: qty 1

## 2023-02-05 MED ORDER — INSULIN ASPART 100 UNIT/ML IJ SOLN
0.0000 [IU] | Freq: Three times a day (TID) | INTRAMUSCULAR | Status: DC
  Administered 2023-02-06 (×2): 5 [IU] via SUBCUTANEOUS
  Filled 2023-02-05 (×2): qty 1

## 2023-02-05 MED ORDER — BISACODYL 5 MG PO TBEC
5.0000 mg | DELAYED_RELEASE_TABLET | Freq: Every day | ORAL | Status: DC | PRN
Start: 2023-02-05 — End: 2023-02-06

## 2023-02-05 MED ORDER — ACETAMINOPHEN 650 MG RE SUPP: 650.0000 mg | Freq: Four times a day (QID) | RECTAL | Status: DC | PRN

## 2023-02-05 MED ORDER — ACETAMINOPHEN 325 MG PO TABS: 650.0000 mg | ORAL_TABLET | Freq: Four times a day (QID) | ORAL | Status: DC | PRN

## 2023-02-05 NOTE — Progress Notes (Signed)
PHARMACY - ANTICOAGULATION CONSULT NOTE  Pharmacy Consult for Warfarin Indication: atrial fibrillation  Allergies  Allergen Reactions   Sacubitril-Valsartan Swelling    Of tongue Of tongue Entresto    Patient Measurements: Height: 5\' 8"  (172.7 cm) Weight: 104.3 kg (230 lb) IBW/kg (Calculated) : 68.4 Heparin Dosing Weight: N/A  Vital Signs: Temp: 98 F (36.7 C) (01/31 1525) Temp Source: Oral (01/31 1525) BP: 179/69 (01/31 1800) Pulse Rate: 69 (01/31 1800)  Labs: Recent Labs    02/03/23 1250 02/05/23 1500 02/05/23 1758  HGB 12.6* 14.6 12.8*  HCT 35.6* 42.4 37.0*  PLT 225 284 220  LABPROT  --   --  15.2  INR  --   --  1.2  CREATININE 1.25* 1.23  --   TROPONINIHS  --  20*  --     Estimated Creatinine Clearance: 62.6 mL/min (by C-G formula based on SCr of 1.23 mg/dL).   Medical History: Past Medical History:  Diagnosis Date   Angioedema    a. 04/2021 - presumed to be 2/2 entresto; b. 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   CAD (coronary artery disease)    a. 02/2017 NSTEMI/Cath: LM 40d, LAD 10m, 33m/d, D2 small w/ sev prox/m dzs, LCX nl, OM1 sev diff dzs - small vessel, OM2 80-90p/m, RCA dominant 73m, RPDA 50, EF <20%-->Med Rx.   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 02/2017 Echo: EF 20-25%; b. 03/2018 Echo: EF 30-35%, mild conc LVH. Impaired relaxation. Nl RV fxn. Mild BAE. Mild to mod MR. Mild Ao root dil; c. 09/2020 Echo: EF 50%, glob HK, mod LVH. GrI DD. Nl RV size/fxn. Mod dil LA. Mild MR.   Hyperlipidemia    Hypertension    Ischemic cardiomyopathy    a. 02/2017 Echo: EF 20-25%; b. 03/2018 Echo: EF 30-35%; c. 03/2018 s/p MDT [id number] [facility] MRI CRT-D; d. 09/2020 Echo: EF 50%.   LBBB (left bundle branch block)    a. Noted 02/2017. Pt denies any known prior history of LBBB (not present on 2014 [facility] ECG interpretation).   Morbid obesity (HCC)     Pacemaker    Stroke [facility])    a. Ocular strokes x 3 - prev eval in [facility] for first 2, [facility] for last one.    Assessment: Patient is a 73 year old male with a medical history of CAD, CHF, diabetes, and s/p pacemaker who present to the ED with swelling of his tongue x 30 min prior to presentation. Of note, patient does have a known history of angioedema of the tongue thought to be secondary to his Sherryll Burger which he is now off. Denies any new foods, meds, or exposures. He was admitted for further work-up. Pharmacy has been consulted to continue patient on home warfarin while inpatient.   Home dose: Warfarin 5 mg PO Tuesday/Thursday and 2.5 mg all other days INR goal: 2-3 Last dose: 1/31 AM Baseline INR: 1.2  Date INR Warfarin Dose  1/31 1.2 1 mg (ordered), 2.5 mg taken PTA         Goal of Therapy:  INR 2-3 Monitor platelets by anticoagulation protocol: Yes   Plan:  Patient already took warfarin 2.5 mg PO x 1 this morning Will give an additional 1 mg (to equal ~ 1.5 x home dose) given subtherapeutic INR on arrival  Monitor INR daily  Merryl Hacker, PharmD Clinical Pharmacist  02/05/2023,6:38 PM

## 2023-02-05 NOTE — ED Provider Notes (Signed)
[name] Provider Note    Event Date/Time   First MD Initiated Contact with Patient 02/05/23 1532     (approximate)   History   Allergic Reaction   HPI  Adam [facility] is a 73 y.o. male with prior history of angioedema of the tongue thought secondary to his Entresto, CAD, CHF, diabetes, status post pacemaker, presenting with swelling of his tongue.  States this is similar to his prior presentations.  He felt little bit of nausea earlier but none now, no vomiting, no shortness of breath, is able to swallow his secretions.  States that he is no longer taking the [facility] but he is on amlodipine.  Allergic reaction started 30 minutes prior to presentation, wife gave him an EpiPen at 250.  Patient states that the swelling is improving slightly.  He denies any rash.  States that he does not think he ate anything new, no other new exposures.  No family history of angioedema.  Patient initially states that his lips were not swollen but wife after looking at him says that she thinks his lower lip is a little bit swollen.  Independent chart review he has been to the ED several times for angioedema of the tongue requiring epi pen, last presentation appears to be in May 2023.  He was admitted for this in April 2023 and was discharged on a steroid taper.   Dependent history obtained from wife.  Physical Exam   Triage Vital Signs: ED Triage Vitals  Encounter Vitals Group     BP 02/05/23 1525 (!) 175/71     Systolic BP Percentile --      Diastolic BP Percentile --      Pulse Rate 02/05/23 1525 88     Resp 02/05/23 1525 (!) 21     Temp 02/05/23 1525 98 F (36.7 C)     Temp Source 02/05/23 1525 Oral     SpO2 02/05/23 1525 98 %     Weight 02/05/23 1526 230 lb (104.3 kg)     Height 02/05/23 1526 5\' 8"  (1.727 m)     Head Circumference --      Peak Flow --      Pain Score 02/05/23 1526 0     Pain Loc --      Pain Education --      Exclude from Growth Chart --      Most recent vital signs: Vitals:   02/05/23 1525 02/05/23 1715  BP: (!) 175/71 (!) 168/64  Pulse: 88 67  Resp: (!) 21 16  Temp: 98 F (36.7 C)   SpO2: 98% 94%     General: Awake, no distress.  CV:  Good peripheral perfusion.  Resp:  Normal effort.  Clear bilaterally Abd:  No distention.  Soft nontender Other:  No obvious rash.  Patient's tongue is swollen bilaterally, no significant lip swelling.  There is no stridor or wheezing.  He is swallowing his secretions.  He will see the back of throat, uvula is not swollen, there is no obvious posterior edema   ED Results / Procedures / Treatments   Labs (all labs ordered are listed, but only abnormal results are displayed) Labs Reviewed  COMPREHENSIVE METABOLIC PANEL - Abnormal; Notable for the following components:      Result Value   Potassium 3.4 (*)    Glucose, Bld 242 (*)    All other components within normal limits  TROPONIN I (HIGH SENSITIVITY) - Abnormal; Notable for the following  components:   Troponin I (High Sensitivity) 20 (*)    All other components within normal limits  CBC WITH DIFFERENTIAL/PLATELET  PROTIME-INR  TROPONIN I (HIGH SENSITIVITY)     EKG  Paced rhythm however rate of 68, running QRS, normal QTc, he has an occasional PVC, T wave inversions to inferior leads, no ischemic ST elevation, T wave versions are new compared to prior    PROCEDURES:  Critical Care performed: Yes, see critical care procedure note(s)  .Critical Care  Performed by: Claybon Jabs, MD Authorized by: Claybon Jabs, MD   Critical care provider statement:    Critical care time (minutes):  40   Critical care was necessary to treat or prevent imminent or life-threatening deterioration of the following conditions: Anaphylaxis.   Critical care was time spent personally by me on the following activities:  Development of treatment plan with patient or surrogate, discussions with consultants, evaluation of patient's response to  treatment, examination of patient, ordering and review of laboratory studies, ordering and review of radiographic studies, ordering and performing treatments and interventions, pulse oximetry, re-evaluation of patient's condition and review of old charts    MEDICATIONS ORDERED IN ED: Medications  loratadine (CLARITIN) tablet 20 mg (has no administration in time range)  methylPREDNISolone sodium succinate (SOLU-MEDROL) 125 mg/2 mL injection 125 mg (125 mg Intravenous Given 02/05/23 1538)  famotidine (PEPCID) IVPB 20 mg premix (0 mg Intravenous Stopped 02/05/23 1712)  diphenhydrAMINE (BENADRYL) injection 25 mg (25 mg Intravenous Given 02/05/23 1539)  sodium chloride 0.9 % bolus 1,000 mL (0 mLs Intravenous Stopped 02/05/23 1728)  EPINEPHrine (EPI-PEN) injection 0.3 mg (0.3 mg Intramuscular Given 02/05/23 1538)     IMPRESSION / MDM / ASSESSMENT AND PLAN / ED COURSE  I reviewed the triage vital signs and the nursing notes.                              Differential diagnosis includes, but is not limited to, angioedema of the tongue, suspect anaphylactic reaction versus medication side effect since he is on amlodipine.  He had Benadryl, Pepcid, Solu-Medrol ordered at triage.  Given only slight improvement after the initial EpiPen, will give him another EpiPen here.  Will get some labs and observe.  Patient's presentation is most consistent with acute presentation with potential threat to life or bodily function.  On reassessment at 530, patient states that tongue swelling has not improved.  About the same as before.  Still maintaining secretions, still able to swallow, states no shortness of breath.  I am still able to see the back of his throat, his uvula is not swollen.  Given no improvement after second epi, we will plan to have him admitted for observation.  Consulted hospitalist was agreeable plan for admission will evaluate the patient.  He is admitted.  Clinical Course as of 02/05/23 1740  Fri  Feb 05, 2023  1714 Second dose of epi was given at 340pm. [TT]  1724 Independent review of labs, troponin is mildly elevated, but he has no chest pain or shortness of breath at this time, electrolytes not severely deranged, no leukocytosis. [TT]    Clinical Course User Index [TT] Jodie Echevaria, Franchot Erichsen, MD     FINAL CLINICAL IMPRESSION(S) / ED DIAGNOSES   Final diagnoses:  Angioedema, initial encounter     Rx / DC Orders   ED Discharge Orders     None  Note:  This document was prepared using Dragon voice recognition software and may include unintentional dictation errors.    Claybon Jabs, MD 02/05/23 [phone]

## 2023-02-05 NOTE — Progress Notes (Deleted)
PHARMACIST - PHYSICIAN COMMUNICATION  CONCERNING:  Enoxaparin (Lovenox) for DVT Prophylaxis    RECOMMENDATION: Patient was prescribed enoxaprin 40mg  q24 hours for VTE prophylaxis.   Filed Weights   02/05/23 1526  Weight: 104.3 kg (230 lb)    Body mass index is 34.97 kg/m.  Estimated Creatinine Clearance: 62.6 mL/min (by C-G formula based on SCr of 1.23 mg/dL).   Based on [facility] policy patient is candidate for enoxaparin 0.5mg /kg TBW SQ every 24 hours based on BMI being >30.   DESCRIPTION: Pharmacy has adjusted enoxaparin dose per [facility] policy.  Patient is now receiving enoxaparin 50 mg every 24 hours    Merryl Hacker, PharmD Clinical Pharmacist  02/05/2023 5:45 PM

## 2023-02-05 NOTE — ED Provider Triage Note (Signed)
Emergency Medicine Provider Triage Evaluation Note  Adam Douglas , a 73 y.o. male  was evaluated in triage.  Pt complains of allergic reaction with tongue and lip swelling, wife gave epi injection at 2pm.  Review of Systems  Positive:  Negative:   Physical Exam  BP (!) 175/71   Pulse 88   Temp 98 F (36.7 C) (Oral)   Resp (!) 21   Ht 5\' 8"  (1.727 m)   Wt 104.3 kg   SpO2 98%   BMI 34.97 kg/m  Gen:   Awake, no distress   Resp:  Normal effort  MSK:   Moves extremities without difficulty  Other:    Medical Decision Making  Medically screening exam initiated at 3:28 PM.  Appropriate orders placed.  Adam Douglas was informed that the remainder of the evaluation will be completed by another provider, this initial triage assessment does not replace that evaluation, and the importance of remaining in the ED until their evaluation is complete.     Faythe Ghee, PA-C 02/05/23 1529

## 2023-02-05 NOTE — H&P (Signed)
History and Physical  [name] WGN:[id number] DOB: Nov 06, 1950 DOA: 02/05/2023 PCP: Adam Matte, MD  Chief Complaint: lip/tongue swelling Historian: patient, chart review  HPI:  Adam Douglas is a 73 y.o. male with prior history of CAD, CHF, diabetes, s/p pacemaker, angioedema of the tongue thought secondary to his Entresto. At baseline, they live with wife and independent with ADLs.  They presented from home to the ED on 02/05/2023 with swelling of his tongue x about 30 minutes prior to presentation. Received epipen dose prior to presentation with mild improvement. Denies any preceding new foods, meds, exposures that he's aware of. Is no longer on entresto which was thought to have caused these symptoms in the past. He is now on amlodipine and states that this episode feels similar to previous episodes of angioedema. Denies difficulty with breathing or swallowing after receiving second epi pen in ED.   In the ED, it was found that they had stable vital signs including elevated blood pressures and no respiratory distress.  Significant findings included: unremarkable metabolic panel except mild hypokalemia of 3.4. CBC normal. Initial troponin 20 (14 a few months ago).   They were initially treated with additional epipen, pepcid, benadryl, steroids.  At this time, denying respiratory distress symptoms. Able to swallow saliva without difficulty.  Patient was admitted to medicine service for further workup and management of angioedema as outlined in detail below.  Assessment/Plan Principal Problem:   Angioedema   Angioedema- airway is patent but still with lip and tongue swelling interfering with conversation. Hemodynamically stable, well perfused. No shock. He has been in ED several times in recent days and had suspected contrast reaction on 1/29. This could perhaps be lingering reaction? - admit for observation - continue steroids - adding loratadine  - epinephrine PRN for  worsening - review of recent medication changes  HTN- poorly controlled - continue amlodipine  Type II DM- no recent A1c - sSSI - hgb A1c am  HLD  CAD  h/o NSTEMI  s/p pacemaker- ECG showing paced rhythm. Follows with Dr. Mariah Douglas - holding meds until med review  Afib- rate controlled currently and on warfarin - f/u INR - continue warfarin  CHF- euvolemic on exam. Echo outdated. EF 50-55 - monitor volume status closely with angioedema  Past Medical History:  Diagnosis Date   Angioedema    a. 04/2021 - presumed to be 2/2 entresto; b. 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   CAD (coronary artery disease)    a. 02/2017 NSTEMI/Cath: LM 40d, LAD 37m, 72m/d, D2 small w/ sev prox/m dzs, LCX nl, OM1 sev diff dzs - small vessel, OM2 80-90p/m, RCA dominant 3m, RPDA 50, EF <20%-->Med Rx.   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 02/2017 Echo: EF 20-25%; b. 03/2018 Echo: EF 30-35%, mild conc LVH. Impaired relaxation. Nl RV fxn. Mild BAE. Mild to mod MR. Mild Ao root dil; c. 09/2020 Echo: EF 50%, glob HK, mod LVH. GrI DD. Nl RV size/fxn. Mod dil LA. Mild MR.   Hyperlipidemia    Hypertension    Ischemic cardiomyopathy    a. 02/2017 Echo: EF 20-25%; b. 03/2018 Echo: EF 30-35%; c. 03/2018 s/p MDT [id number] [facility] MRI CRT-D; d. 09/2020 Echo: EF 50%.   LBBB (left bundle branch block)    a. Noted 02/2017. Pt denies any known prior history of LBBB (not present on 2014 [facility] ECG  interpretation).   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    Past Surgical History:  Procedure Laterality Date   BIV ICD INSERTION CRT-D N/A 03/18/2018   Procedure: BIV ICD INSERTION CRT-D;  Surgeon: Duke Salvia, MD;  Location: [facility] INVASIVE CV LAB;  Service: Cardiovascular;  Laterality: N/A;   CERVICAL DISC  SURGERY     CHOLECYSTECTOMY     LEFT HEART CATH AND CORONARY ANGIOGRAPHY N/A 03/03/2017   Procedure: LEFT HEART CATH AND CORONARY ANGIOGRAPHY;  Surgeon: Antonieta Iba, MD;  Location: [facility] INVASIVE CV LAB;  Service: Cardiovascular;  Laterality: N/A;     reports that he has quit smoking. His smoking use included cigarettes. He has a 15 pack-year smoking history. He has never used smokeless tobacco. He reports that he does not drink alcohol and does not use drugs.  Allergies  Allergen Reactions   Sacubitril-Valsartan Swelling    Of tongue Of tongue Entresto    Family History  Problem Relation Age of Onset   CAD Mother 69   Heart attack Mother    CAD Father 64   Heart attack Father    CAD Sister     Prior to Admission medications   Medication Sig Start Date End Date Taking? Authorizing Provider  acetaminophen (TYLENOL) 650 MG CR tablet Take 1,300 mg by mouth 2 (two) times daily.    [provider]  amLODipine (NORVASC) 10 MG tablet Take 1 tablet (10 mg total) by mouth daily. 05/08/21 02/03/23  Creig Hines, NP  aspirin EC 81 MG tablet Take 1 tablet (81 mg total) by mouth daily. 03/09/17   Creig Hines, NP  atorvastatin (LIPITOR) 80 MG tablet Take 80 mg by mouth daily. 02/14/19   [provider]  carvedilol (COREG) 12.5 MG tablet Take 1 tablet by mouth twice daily 06/30/21   Antonieta Iba, MD  citalopram (CELEXA) 40 MG tablet Take 40 mg by mouth daily.    [provider]  cyclobenzaprine (FLEXERIL) 10 MG tablet Take 10 mg by mouth 3 (three) times daily as needed for muscle spasms. Patient not taking: Reported on 10/08/2022 01/19/20   [provider]  EPINEPHrine 0.3 mg/0.3 mL IJ SOAJ injection Inject 0.3 mg into the muscle as needed for up to 1 dose for anaphylaxis. 05/17/21   Gilles Chiquito, MD  furosemide (LASIX) 40 MG tablet TAKE 1 TABLET BY MOUTH ONCE DAILY *YOU MAY TAKE ONE ADDITIONAL TABLET ONCE DAILY AS NEEDED FOR  SWELLING OR INCREASE SHORTNESS OF BREATH* 11/21/21   Gollan, Tollie Pizza, MD  insulin aspart (NOVOLOG) 100 UNIT/ML injection Inject 10 Units into the skin 3 (three) times daily with meals.    [provider]  insulin detemir (LEVEMIR) 100 UNIT/ML injection Inject 50 Units into the skin 2 (two) times daily. Patient not taking: Reported on 10/08/2022    [provider]  LANTUS SOLOSTAR 100 UNIT/ML Solostar Pen Inject 40 Units into the skin 2 (two) times daily. 09/28/22   [provider]  metFORMIN (GLUCOPHAGE-XR) 500 MG 24 hr tablet Take 500 mg by mouth 2 (two) times daily.    [provider]  nitroGLYCERIN (NITROSTAT) 0.4 MG SL tablet PLACE 1 TABLET UNDER THE TONGUE EVERY 5 MINUTES AS NEEDED 10/03/21   Antonieta Iba, MD  spironolactone (ALDACTONE) 25 MG tablet Take 1 tablet (25  mg total) by mouth daily. 08/04/22   Duke Salvia, MD  traMADol (ULTRAM) 50 MG tablet Take 50-100 mg by mouth every 8 (eight) hours as needed for pain.    [provider]  warfarin (COUMADIN) 5 MG tablet TAKE 1 TO 2 TABLETS BY MOUTH ONCE DAILY OR AS DIRECTED BY THE COUMADIN CLINIC 01/08/22   Antonieta Iba, MD   I have personally, briefly reviewed patient's prior medical records in [place] [facility]  Objective: Blood pressure (!) 168/64, pulse 67, temperature 98 F (36.7 C), temperature source Oral, resp. rate 16, height 5\' 8"  (1.727 m), weight 104.3 kg, SpO2 94%.   Constitutional: NAD, calm, comfortable HEENT: lids and conjunctivae normal. MMM. Posterior pharynx clear of any exudate or lesions. No significant edema of pharynx observed. Per wife, lips are mildly larger than baseline.  Neck: normal, supple, no masses, no thyromegaly Respiratory: CTAB, no wheezing, no crackles. Normal respiratory effort. No accessory muscle use.  Cardiovascular: RRR, no murmurs / rubs / gallops. No extremity edema. 2+ pedal pulses. Quick cap refill distally Abdomen: soft, NT, ND, no masses or  HSM palpated. Musculoskeletal: No joint deformity upper and lower extremities. Normal muscle tone.  Skin: dry, intact, normal color, normal temperature on exposed skin Neurologic: Alert and oriented x 3. Normal speech. Grossly non-focal exam. PERRL Psychiatric: Normal mood. Congruent affect.  Labs on Admission: I have personally reviewed admission labs and imaging studies  CBC    Component Value Date/Time   WBC 17.1 (H) 02/05/2023 1758   RBC 4.00 (L) 02/05/2023 1758   HGB 12.8 (L) 02/05/2023 1758   HGB 12.9 (L) 07/30/2020 0855   HCT 37.0 (L) 02/05/2023 1758   HCT 37.9 07/30/2020 0855   PLT 220 02/05/2023 1758   PLT 178 07/30/2020 0855   MCV 92.5 02/05/2023 1758   MCV 93 07/30/2020 0855   MCH 32.0 02/05/2023 1758   MCHC 34.6 02/05/2023 1758   RDW 13.0 02/05/2023 1758   RDW 13.5 07/30/2020 0855   LYMPHSABS 2.7 02/05/2023 1500   LYMPHSABS 2.1 04/05/2019 1505   MONOABS 0.6 02/05/2023 1500   EOSABS 0.2 02/05/2023 1500   EOSABS 0.2 04/05/2019 1505   BASOSABS 0.0 02/05/2023 1500   BASOSABS 0.0 04/05/2019 1505   CMP     Component Value Date/Time   NA 138 02/05/2023 1500   NA 140 07/30/2020 0855   K 3.4 (L) 02/05/2023 1500   CL 104 02/05/2023 1500   CO2 22 02/05/2023 1500   GLUCOSE 242 (H) 02/05/2023 1500   BUN 21 02/05/2023 1500   BUN 22 07/30/2020 0855   CREATININE 1.23 02/05/2023 1500   CALCIUM 8.9 02/05/2023 1500   PROT 7.3 02/05/2023 1500   PROT 6.7 11/30/2018 1548   ALBUMIN 3.8 02/05/2023 1500   ALBUMIN 4.1 11/30/2018 1548   AST 28 02/05/2023 1500   ALT 22 02/05/2023 1500   ALKPHOS 75 02/05/2023 1500   BILITOT 0.7 02/05/2023 1500   BILITOT 0.3 11/30/2018 1548   GFRNONAA >60 02/05/2023 1500   GFRAA 86 04/05/2019 1505    Radiological Exams on Admission: No results found.  EKG: Independently reviewed. Paced HR 68. Similar to previous comparisons  DVT prophylaxis: on warfarin   Code Status: full  Family Communication: called wife- VM  Disposition Plan:  admit for obs  Consults called: none   Leeroy Bock, DO [facility]  02/05/2023, 6:16 PM    To contact the appropriate [facility] Attending or Consulting provider: Check [email or web address] for coverage from  7pm-7am

## 2023-02-05 NOTE — ED Triage Notes (Addendum)
Pt here after an allergic reaction that started 30 mins ago. Pt tongue swollen on arrival, pt still able to talk. Pt breathing is diminished. Pt denies any new medications or food. Wife gave epi-pen at 1450.

## 2023-02-06 DIAGNOSIS — T783XXD Angioneurotic edema, subsequent encounter: Secondary | ICD-10-CM | POA: Diagnosis not present

## 2023-02-06 LAB — GLUCOSE, CAPILLARY
Glucose-Capillary: 312 mg/dL — ABNORMAL HIGH (ref 70–99)
Glucose-Capillary: 261 mg/dL — ABNORMAL HIGH (ref 70–99)
Glucose-Capillary: 259 mg/dL — ABNORMAL HIGH (ref 70–99)

## 2023-02-06 LAB — CBC
HCT: 34.8 % — ABNORMAL LOW (ref 39.0–52.0)
Hemoglobin: 12.3 g/dL — ABNORMAL LOW (ref 13.0–17.0)
MCH: 31.6 pg (ref 26.0–34.0)
MCHC: 35.3 g/dL (ref 30.0–36.0)
MCV: 89.5 fL (ref 80.0–100.0)
Platelets: 220 10*3/uL (ref 150–400)
RBC: 3.89 MIL/uL — ABNORMAL LOW (ref 4.22–5.81)
RDW: 12.7 % (ref 11.5–15.5)
WBC: 13 10*3/uL — ABNORMAL HIGH (ref 4.0–10.5)
nRBC: 0 % (ref 0.0–0.2)

## 2023-02-06 LAB — BASIC METABOLIC PANEL
Anion gap: 10 (ref 5–15)
BUN: 22 mg/dL (ref 8–23)
CO2: 19 mmol/L — ABNORMAL LOW (ref 22–32)
Calcium: 8.5 mg/dL — ABNORMAL LOW (ref 8.9–10.3)
Chloride: 104 mmol/L (ref 98–111)
Creatinine, Ser: 1.23 mg/dL (ref 0.61–1.24)
GFR, Estimated: >60 mL/min (ref >60)
Glucose, Bld: 334 mg/dL — ABNORMAL HIGH (ref 70–99)
Potassium: 4.5 mmol/L (ref 3.5–5.1)
Sodium: 133 mmol/L — ABNORMAL LOW (ref 135–145)

## 2023-02-06 LAB — PROTIME-INR
INR: 1.2 (ref 0.8–1.2)
Prothrombin Time: 15.2 s (ref 11.4–15.2)

## 2023-02-06 MED ORDER — ASPIRIN 81 MG PO TBEC
81.0000 mg | DELAYED_RELEASE_TABLET | Freq: Every day | ORAL | Status: DC
  Administered 2023-02-06: 81 mg via ORAL
  Filled 2023-02-06: qty 1

## 2023-02-06 MED ORDER — CITALOPRAM HYDROBROMIDE 20 MG PO TABS
40.0000 mg | ORAL_TABLET | Freq: Every day | ORAL | Status: DC
  Administered 2023-02-06: 40 mg via ORAL
  Filled 2023-02-06: qty 2

## 2023-02-06 MED ORDER — OXYCODONE-ACETAMINOPHEN 5-325 MG PO TABS: 1.0000 | ORAL_TABLET | Freq: Four times a day (QID) | ORAL | Status: DC | PRN

## 2023-02-06 MED ORDER — CARVEDILOL 12.5 MG PO TABS
12.5000 mg | ORAL_TABLET | Freq: Two times a day (BID) | ORAL | Status: DC
  Administered 2023-02-06: 12.5 mg via ORAL
  Filled 2023-02-06: qty 1

## 2023-02-06 MED ORDER — LORATADINE 10 MG PO TABS: 10.0000 mg | ORAL_TABLET | Freq: Every day | ORAL | 0 refills | Status: DC

## 2023-02-06 MED ORDER — AMLODIPINE BESYLATE 10 MG PO TABS
10.0000 mg | ORAL_TABLET | Freq: Every day | ORAL | Status: DC
  Administered 2023-02-06: 10 mg via ORAL
  Filled 2023-02-06: qty 1

## 2023-02-06 MED ORDER — SPIRONOLACTONE 25 MG PO TABS
25.0000 mg | ORAL_TABLET | Freq: Every day | ORAL | Status: DC
Start: 2023-02-06 — End: 2023-02-06
  Administered 2023-02-06: 25 mg via ORAL
  Filled 2023-02-06: qty 1

## 2023-02-06 MED ORDER — FAMOTIDINE 20 MG PO TABS: 20.0000 mg | ORAL_TABLET | Freq: Two times a day (BID) | ORAL | 0 refills | Status: DC

## 2023-02-06 MED ORDER — PREDNISONE 20 MG PO TABS: 40.0000 mg | ORAL_TABLET | Freq: Every day | ORAL | 0 refills | Status: AC

## 2023-02-06 MED ORDER — ATORVASTATIN CALCIUM 20 MG PO TABS
80.0000 mg | ORAL_TABLET | Freq: Every day | ORAL | Status: DC
  Administered 2023-02-06: 80 mg via ORAL
  Filled 2023-02-06: qty 4

## 2023-02-06 MED ORDER — EPINEPHRINE 0.3 MG/0.3ML IJ SOAJ: 0.3000 mg | INTRAMUSCULAR | 0 refills | Status: AC | PRN

## 2023-02-06 MED ORDER — WARFARIN SODIUM 5 MG PO TABS
5.0000 mg | ORAL_TABLET | Freq: Once | ORAL | Status: DC
  Filled 2023-02-06: qty 1

## 2023-02-06 MED ORDER — TRAZODONE HCL 50 MG PO TABS: 50.0000 mg | ORAL_TABLET | Freq: Every day | ORAL | Status: DC

## 2023-02-06 MED ORDER — TRAMADOL HCL 50 MG PO TABS: 50.0000 mg | ORAL_TABLET | Freq: Three times a day (TID) | ORAL | Status: DC | PRN

## 2023-02-06 MED ORDER — PANTOPRAZOLE SODIUM 20 MG PO TBEC
20.0000 mg | DELAYED_RELEASE_TABLET | Freq: Two times a day (BID) | ORAL | Status: DC
  Administered 2023-02-06: 20 mg via ORAL
  Filled 2023-02-06: qty 1

## 2023-02-06 MED ORDER — INSULIN GLARGINE-YFGN 100 UNIT/ML ~~LOC~~ SOLN
40.0000 [IU] | Freq: Two times a day (BID) | SUBCUTANEOUS | Status: DC
  Filled 2023-02-06 (×2): qty 0.4

## 2023-02-06 NOTE — Discharge Summary (Signed)
Physician Discharge Summary   Patient: Adam Douglas MRN: [id number] DOB: 12-Apr-1950  Admit date:     02/05/2023  Discharge date: 02/06/23  Discharge Physician: Jonah Blue   PCP: Louis Matte, MD   Recommendations at discharge:   Continue prednisone x 5 total days and Claritin/Pepcid x 10 total days Take benadryl as needed for mild persistent symptoms, use Epipen for worsening airway closure/significant tongue or lip edema Follow up with allergist Follow up in 1-2 weeks with Dr. Sherrin Daisy  Discharge Diagnoses: Principal Problem:   Angioedema Active Problems:   Essential hypertension   Type 2 diabetes mellitus with hyperlipidemia (HCC)   Chronic systolic heart failure (HCC)   Hyperlipidemia LDL goal <70   Atrial fibrillation [facility])    Hospital Course: 72yo with h/o CAD, DM, chronic diastolic CHF, pacemaker, and prior episodes of angioedema attributed to [facility] (no longer taking) who presented with recurrent angioedema.  Epipen given with mild improvement.  Possible contrast dye reaction.  Observed with steroids, loratadine, prn Epi.  Assessment and Plan:  Angioedema Prior h/o recurrent angioedema, thought to be related to [facility] but he had recurrence without He has been to ENT and allergy without a clear reason Currently thought to be related to contrast dye reaction from 1/29 with  Airway is patent but still with lip and tongue swelling interfering with conversation on admission Hemodynamically stable, well perfused, no shock Observed overnight with ongoing improvement, wants to go home Continue steroids x 5 total days Adding loratadine and famotidine x 10 days Epinephrine PRN for worsening   HTN Poorly controlled Continue amlodipine, carvedilol   Type II DM A1c 7.7, suboptimal control Resume Glucophage at time of dc Continue glargine Cover with moderate-scale SSI    HLD  CAD  h/o NSTEMI  s/p pacemaker ECG showing paced rhythm Follows with  Dr. Mariah Milling Hold Lasix, continue spironolactone as inpatient; resume both as outpatient Continue ASA, Atorvastatin   Afib Rate controlled with carvedilol Continue warfarin - pharmacy has been dosing   Chronic combined CHF Euvolemic on exam Last echo was in 12/2020 with EF 50-55% (improved from 30-35%) and grade 1 diastolic dysfuction Resume Lasix, spironolactone upon discharge  Depression Continue citalopram, trazodone     Consultants: None  Procedures: None  Antibiotics: None      Pain control - [place] Controlled Substance Reporting System database was reviewed. and patient was instructed, not to drive, operate heavy machinery, perform activities at heights, swimming or participation in water activities or provide baby-sitting services while on Pain, Sleep and Anxiety Medications; until their outpatient Physician has advised to do so again. Also recommended to not to take more than prescribed Pain, Sleep and Anxiety Medications.    Disposition: Home Diet recommendation:  Cardiac and Carb modified diet DISCHARGE MEDICATION: Allergies as of 02/06/2023       Reactions   Sacubitril-valsartan Swelling   Of tongue Of tongue Entresto        Medication List     TAKE these medications    acetaminophen 650 MG CR tablet Commonly known as: TYLENOL Take 1,300 mg by mouth 2 (two) times daily.   amLODipine 10 MG tablet Commonly known as: NORVASC Take 1 tablet (10 mg total) by mouth daily.   aspirin EC 81 MG tablet Take 1 tablet (81 mg total) by mouth daily.   atorvastatin 80 MG tablet Commonly known as: LIPITOR Take 80 mg by mouth daily.   carvedilol 12.5 MG tablet Commonly known as: COREG Take 1 tablet by  mouth twice daily   citalopram 40 MG tablet Commonly known as: CELEXA Take 40 mg by mouth daily.   cyclobenzaprine 10 MG tablet Commonly known as: FLEXERIL Take 10 mg by mouth 3 (three) times daily as needed for muscle spasms.   EPINEPHrine  0.3 mg/0.3 mL Soaj injection Commonly known as: EPI-PEN Inject 0.3 mg into the muscle as needed for up to 1 dose for anaphylaxis.   famotidine 20 MG tablet Commonly known as: PEPCID Take 1 tablet (20 mg total) by mouth 2 (two) times daily for 10 days.   furosemide 40 MG tablet Commonly known as: LASIX TAKE 1 TABLET BY MOUTH ONCE DAILY *YOU MAY TAKE ONE ADDITIONAL TABLET ONCE DAILY AS NEEDED FOR SWELLING OR INCREASE SHORTNESS OF BREATH*   insulin aspart 100 UNIT/ML injection Commonly known as: novoLOG Inject 10 Units into the skin 3 (three) times daily with meals.   Lantus SoloStar 100 UNIT/ML Solostar Pen Generic drug: insulin glargine Inject 40 Units into the skin 2 (two) times daily.   loratadine 10 MG tablet Commonly known as: Claritin Take 1 tablet (10 mg total) by mouth daily for 10 days.   metFORMIN 500 MG 24 hr tablet Commonly known as: GLUCOPHAGE-XR Take 500 mg by mouth 2 (two) times daily.   nitroGLYCERIN 0.4 MG SL tablet Commonly known as: NITROSTAT PLACE 1 TABLET UNDER THE TONGUE EVERY 5 MINUTES AS NEEDED   oxyCODONE-acetaminophen 5-325 MG tablet Commonly known as: PERCOCET/ROXICET Take 1 tablet by mouth 4 (four) times daily as needed.   pantoprazole 20 MG tablet Commonly known as: PROTONIX Take 20 mg by mouth 2 (two) times daily.   predniSONE 20 MG tablet Commonly known as: DELTASONE Take 2 tablets (40 mg total) by mouth daily with breakfast for 3 doses. Start taking on: February 07, 2023   spironolactone 25 MG tablet Commonly known as: ALDACTONE Take 1 tablet (25 mg total) by mouth daily.   traMADol 50 MG tablet Commonly known as: ULTRAM Take 50-100 mg by mouth every 8 (eight) hours as needed for pain.   traZODone 50 MG tablet Commonly known as: DESYREL Take 50 mg by mouth at bedtime.   warfarin 5 MG tablet Commonly known as: COUMADIN TAKE 1 TO 2 TABLETS BY MOUTH ONCE DAILY OR AS DIRECTED BY THE COUMADIN CLINIC What changed: See the new  instructions.   warfarin 2.5 MG tablet Commonly known as: COUMADIN Take 2.5 mg by mouth. Take 2.5 mg by mouth in the morning on Monday, Wednesday, Friday, Saturday, and Sunday What changed: Another medication with the same name was changed. Make sure you understand how and when to take each.        Discharge Exam:   Subjective: Feeling better, angioedema improving, wants to go home.   Objective: Vitals:   02/06/23 0357 02/06/23 0842  BP: (!) 143/57 (!) 172/60  Pulse: 71 66  Resp: 18 16  Temp: (!) 97.5 F (36.4 C) 97.7 F (36.5 C)  SpO2: 94% 96%    Intake/Output Summary (Last 24 hours) at 02/06/2023 1209 Last data filed at 02/06/2023 0900 Gross per 24 hour  Intake 1110 ml  Output --  Net 1110 ml   Filed Weights   02/05/23 1526  Weight: 104.3 kg    Exam:  General:  Appears calm and comfortable and is in NAD, on RA Eyes:  EOMI, normal lids, iris ENT:  grossly normal hearing, lips & tongue, mmm; improved to resolved tongue/lip edema per patient/wife Neck:  no LAD, masses or  thyromegaly Cardiovascular:  RRR, no m/r/g. No LE edema.  Respiratory:   CTA bilaterally with no wheezes/rales/rhonchi.  Normal respiratory effort. Abdomen:  soft, NT, ND Skin:  no rash or induration seen on limited exam Musculoskeletal:  grossly normal tone BUE/BLE, good ROM, no bony abnormality Psychiatric:  grossly normal mood and affect, speech fluent and appropriate, AOx3 Neurologic:  CN 2-12 grossly intact, moves all extremities in coordinated fashion  Data Reviewed: I have reviewed the patient's lab results since admission.  Pertinent labs for today include:  Na++ 133 CO2 19 Glucose 334, 312, 261 WBC 13 Hgb 12.3 A1c 7.7    Condition at discharge: improving  The results of significant diagnostics from this hospitalization (including imaging, microbiology, ancillary and laboratory) are listed below for reference.   Imaging Studies: CT LUMBAR SPINE W CONTRAST Result Date:  02/03/2023 CLINICAL DATA:  Postmyelogram images EXAM: CT LUMBAR SPINE WITH CONTRAST TECHNIQUE: Multidetector CT imaging of the lumbar spine was performed with intravenous contrast administration. RADIATION DOSE REDUCTION: This exam was performed according to the departmental dose-optimization program which includes automated exposure control, adjustment of the mA and/or kV according to patient size and/or use of iterative reconstruction technique. CONTRAST:  20 mL Omnipaque 180 COMPARISON:  No prior CT of the lumbar spine. FINDINGS: Segmentation: 5 lumbar type vertebral bodies. The last fully formed disc space is labeled L5-S1. Alignment: Preservation of the normal lumbar lordosis. 4 mm anterolisthesis of L4 on L5. 3 mm retrolisthesis of L5 on S1. Minimal dextrocurvature. Vertebrae: No acute fracture or focal pathologic process. Endplate degenerative changes, most prominently at L5-S1. Paraspinal and other soft tissues: Aortic atherosclerosis and branch vessel calcifications. No lymphadenopathy. Disc levels: Evaluation is somewhat complicated by contrast in the subdural space and air within the thecal sac. The subdural contrast appears to cause some thecal sac narrowing. T12-L1: No significant disc bulge. No spinal canal stenosis or neural foraminal narrowing. L1-L2: Minimal disc bulge. Moderate facet hypertrophy and ligamentum flavum hypertrophy. Moderate spinal canal stenosis. Mild right neural foraminal narrowing. L2-L3: Mild disc bulge. Mild facet arthropathy. Mild thecal sac narrowing due to subdural contrast but likely no more than mild spinal canal stenosis in this location. No neural foraminal narrowing. L3-L4: Mild disc bulge with small central disc protrusion. Mild facet arthropathy. Subdural contrast contributes to some thecal sac narrowing, but there is likely no actual spinal canal stenosis at this level. Mild bilateral neural foraminal narrowing. L4-L5: Grade 1 anterolisthesis with disc unroofing and  central/left paracentral disc extrusion with 13 mm of cranial migration (series 5, image 50 and series 3, image 89), which likely contacts the descending left L4 nerve roots posterior to the L4 vertebral body. Moderate to severe facet arthropathy. Effacement of the lateral recesses. Moderate to severe spinal canal stenosis, which is somewhat accentuated by subdural contrast. Severe left and moderate to severe right neural foraminal narrowing. L5-S1: Retrolisthesis and moderate disc bulge. Moderate facet arthropathy. Subdural contrast causes significant thecal sac narrowing at this level, however the overall size of the spinal canal is likely does not result in significant spinal canal stenosis. Moderate to severe bilateral neural foraminal narrowing. IMPRESSION: 1. Evaluation is somewhat complicated by contrast in the subdural space and air within the thecal sac. The subdural contrast appears to cause some thecal sac narrowing, which limits evaluation of spinal canal stenosis. 2. L4-L5 moderate to severe spinal canal stenosis, which is somewhat accentuated by subdural contrast. Severe left and moderate to severe right neural foraminal narrowing. Central/left paracentral disc extrusion with  13 mm of cranial migration, which likely contacts the descending left L4 nerve roots posterior to the L4 vertebral body. 3. L1-L2 moderate spinal canal stenosis and mild right neural foraminal narrowing. 4. L5-S1 moderate to severe bilateral neural foraminal narrowing. 5. Aortic atherosclerosis. Aortic Atherosclerosis (ICD10-I70.0). Electronically Signed   By: Wiliam Ke M.D.   On: 02/03/2023 13:48   DG FL GUIDED LP INJECT MYELOGRAM LUMBAR Result Date: 02/03/2023 CLINICAL DATA:  Patient with history of chronic low back pain, left sided sciatica. Request for myelogram prior to CT lumbar spine for further evaluation. EXAM: LUMBAR MYELOGRAM UNDER FLUOROSCOPY FLUOROSCOPY: Radiation exposure index: 28.10 mGy reference air kerma  TECHNIQUE: The patient was positioned prone on the fluoroscopy table. An appropriate skin entry site was determined under fluoroscopy and marked. The operator donned sterile gloves and a mask. The lower back was prepped and draped in the usual sterile fashion. Local anesthesia was provided with 1% lidocaine. Under intermittent fluoroscopy, lumbar puncture was initially performed at the L2-3 level without return of CSF despite needle repositioning. The needle was removed and a new 20 gauge needle was advanced into the thecal sac at L4-5 with return of clear, colorless CSF. Subsequently, 15 mL of Omnipaque 180 contrast was slowly hand injected into the thecal sac under intermittent fluoroscopy. During the end of contrast administration the patient's eyes rolled back and he began to have snoring respirations with seizure-like activity including jerking/shaking of arms and legs bilaterally for approximately 45 - 60 seconds. The stylet was replaced and the needle was immediately removed. He did not respond to verbal cues at that time however pulse remained palpable and respirations unlabored, as such CPR was not initiated. Rapid response was immediately called and the patient was placed in the right lateral position on the fluoroscopy table for airway protection. Approximately 1 minute after cessation of seizure-like activity the patient was drowsy but able to correctly tell me is name, birth date, hospital name and that he came here today for a procedure on his back. He denied history of seizures or contrast allergy. After the patient was stabilized he was taken to CT for completion of CT lumbar spine and he was then taken to the ED for further evaluation. Above was discussed with patient and his wife Kathan Kirker who state understanding of the plan. COMPLICATIONS: Seizure-like activity following administration of intra-thecal contrast as above. IMPRESSION: Technically successful lumbar myelogram with adverse event of  seizure-like activity during administration of intra-thecal contrast. Performed by Lynnette Caffey, PA-C and Malachy Moan, MD Electronically Signed   By: Malachy Moan M.D.   On: 02/03/2023 13:37   CT Head Wo Contrast Result Date: 02/03/2023 CLINICAL DATA:  Seizure like activity EXAM: CT HEAD WITHOUT CONTRAST TECHNIQUE: Contiguous axial images were obtained from the base of the skull through the vertex without intravenous contrast. RADIATION DOSE REDUCTION: This exam was performed according to the departmental dose-optimization program which includes automated exposure control, adjustment of the mA and/or kV according to patient size and/or use of iterative reconstruction technique. COMPARISON:  None Available. FINDINGS: Brain: Contrast in the subarachnoid spaces from recent myelogram, with multiple foci of pneumocephalus, which are in the ventricles subarachnoid spaces. No evidence of acute infarction, hemorrhage, mass, mass effect, or midline shift. No hydrocephalus or extra-axial fluid collection. Vascular: No hyperdense vessel. Skull: Negative for fracture or focal lesion. Sinuses/Orbits: No acute finding. Other: The mastoid air cells are well aerated. IMPRESSION: Multiple foci of pneumocephalus in the ventricles and subarachnoid spaces, which are  not normal following a myelogram. Contrast within the subarachnoid spaces is not unexpected following a myelogram. No additional acute intracranial process. These results were called by telephone at the time of interpretation on 02/03/2023 at 1:28 pm to provider Greig Right , who verbally acknowledged these results. Electronically Signed   By: Wiliam Ke M.D.   On: 02/03/2023 13:30    Microbiology: Results for orders placed or performed during the hospital encounter of 05/01/21  Resp Panel by RT-PCR (Flu A&B, Covid) Nasopharyngeal Swab     Status: None   Collection Time: 05/01/21  9:24 PM   Specimen: Nasopharyngeal Swab; Nasopharyngeal(NP) swabs  in vial transport medium  Result Value Ref Range Status   SARS Coronavirus 2 by RT PCR NEGATIVE NEGATIVE Final    Comment: (NOTE) SARS-CoV-2 target nucleic acids are NOT DETECTED.  The SARS-CoV-2 RNA is generally detectable in upper respiratory specimens during the acute phase of infection. The lowest concentration of SARS-CoV-2 viral copies this assay can detect is 138 copies/mL. A negative result does not preclude SARS-Cov-2 infection and should not be used as the sole basis for treatment or other patient management decisions. A negative result may occur with  improper specimen collection/handling, submission of specimen other than nasopharyngeal swab, presence of viral mutation(s) within the areas targeted by this assay, and inadequate number of viral copies(<138 copies/mL). A negative result must be combined with clinical observations, patient history, and epidemiological information. The expected result is Negative.  Fact Sheet for Patients:  BloggerCourse.com  Fact Sheet for Healthcare Providers:  SeriousBroker.it  This test is no t yet approved or cleared by the [place] FDA and  has been authorized for detection and/or diagnosis of SARS-CoV-2 by FDA under an Emergency Use Authorization (EUA). This EUA will remain  in effect (meaning this test can be used) for the duration of the COVID-19 declaration under Section 564(b)(1) of the Act, 21 U.S.C.section 360bbb-3(b)(1), unless the authorization is terminated  or revoked sooner.       Influenza A by PCR NEGATIVE NEGATIVE Final   Influenza B by PCR NEGATIVE NEGATIVE Final    Comment: (NOTE) The Xpert Xpress SARS-CoV-2/FLU/RSV plus assay is intended as an aid in the diagnosis of influenza from Nasopharyngeal swab specimens and should not be used as a sole basis for treatment. Nasal washings and aspirates are unacceptable for Xpert Xpress  SARS-CoV-2/FLU/RSV testing.  Fact Sheet for Patients: BloggerCourse.com  Fact Sheet for Healthcare Providers: SeriousBroker.it  This test is not yet approved or cleared by the [place] FDA and has been authorized for detection and/or diagnosis of SARS-CoV-2 by FDA under an Emergency Use Authorization (EUA). This EUA will remain in effect (meaning this test can be used) for the duration of the COVID-19 declaration under Section 564(b)(1) of the Act, 21 U.S.C. section 360bbb-3(b)(1), unless the authorization is terminated or revoked.  Performed at [facility], [address] [place], [place] [address]     Labs: CBC: Recent Labs  Lab 02/03/23 1250 02/05/23 1500 02/05/23 1758 02/06/23 0439  WBC 9.3 8.9 17.1* 13.0*  NEUTROABS  --  5.3  --   --   HGB 12.6* 14.6 12.8* 12.3*  HCT 35.6* 42.4 37.0* 34.8*  MCV 90.4 92.4 92.5 89.5  PLT 225 284 220 220   Basic Metabolic Panel: Recent Labs  Lab 02/03/23 1250 02/05/23 1500 02/05/23 1758 02/06/23 0439  NA 139 138  --  133*  K 3.5 3.4*  --  4.5  CL 106 104  --  104  CO2 23 22  --  19*  GLUCOSE 119* 242*  --  334*  BUN 26* 21  --  22  CREATININE 1.25* 1.23 1.16 1.23  CALCIUM 9.0 8.9  --  8.5*   Liver Function Tests: Recent Labs  Lab 02/05/23 1500  AST 28  ALT 22  ALKPHOS 75  BILITOT 0.7  PROT 7.3  ALBUMIN 3.8   CBG: Recent Labs  Lab 02/03/23 1116 02/05/23 2249 02/06/23 0515 02/06/23 0835 02/06/23 1146  GLUCAP 156* 285* 312* 261* 259*    Discharge time spent: greater than 30 minutes.  Signed: Jonah Blue, MD [facility] 02/06/2023

## 2023-02-06 NOTE — Plan of Care (Signed)

## 2023-02-06 NOTE — Hospital Course (Signed)
72yo with h/o CAD, DM, chronic diastolic CHF, pacemaker, and prior episodes of angioedema attributed to [facility] (no longer taking) who presented with recurrent angioedema.  Epipen given with mild improvement.  Possible contrast dye reaction.  Observed with steroids, loratadine, prn Epi.

## 2023-02-06 NOTE — Progress Notes (Signed)
Wife has arrived on the unit as of now.

## 2023-02-06 NOTE — Progress Notes (Signed)
PHARMACY - ANTICOAGULATION CONSULT NOTE  Pharmacy Consult for Warfarin Indication: atrial fibrillation  Allergies  Allergen Reactions   Sacubitril-Valsartan Swelling    Of tongue Of tongue Entresto   Ivp Dye [Iodinated Contrast Media] Swelling    Patient Measurements: Height: 5\' 8"  (172.7 cm) Weight: 104.3 kg (230 lb) IBW/kg (Calculated) : 68.4 Heparin Dosing Weight: N/A  Vital Signs: Temp: 97.7 F (36.5 C) (02/01 0842) Temp Source: Oral (02/01 0842) BP: 172/60 (02/01 0842) Pulse Rate: 66 (02/01 0842)  Labs: Recent Labs    02/05/23 1500 02/05/23 1758 02/06/23 0439  HGB 14.6 12.8* 12.3*  HCT 42.4 37.0* 34.8*  PLT 284 220 220  LABPROT  --  15.2 15.2  INR  --  1.2 1.2  CREATININE 1.23 1.16 1.23  TROPONINIHS 20* 22*  --     Estimated Creatinine Clearance: 62.6 mL/min (by C-G formula based on SCr of 1.23 mg/dL).   Medical History: Past Medical History:  Diagnosis Date   Angioedema    a. 04/2021 - presumed to be 2/2 entresto; b. 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   CAD (coronary artery disease)    a. 02/2017 NSTEMI/Cath: LM 40d, LAD 24m, 62m/d, D2 small w/ sev prox/m dzs, LCX nl, OM1 sev diff dzs - small vessel, OM2 80-90p/m, RCA dominant 46m, RPDA 50, EF <20%-->Med Rx.   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 02/2017 Echo: EF 20-25%; b. 03/2018 Echo: EF 30-35%, mild conc LVH. Impaired relaxation. Nl RV fxn. Mild BAE. Mild to mod MR. Mild Ao root dil; c. 09/2020 Echo: EF 50%, glob HK, mod LVH. GrI DD. Nl RV size/fxn. Mod dil LA. Mild MR.   Hyperlipidemia    Hypertension    Ischemic cardiomyopathy    a. 02/2017 Echo: EF 20-25%; b. 03/2018 Echo: EF 30-35%; c. 03/2018 s/p MDT [id number] [place] MRI CRT-D; d. 09/2020 Echo: EF 50%.   LBBB (left bundle branch block)    a. Noted 02/2017. Pt denies any known prior history of LBBB (not present on 2014 [facility]  ECG interpretation).   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    Assessment: Patient is a 73 year old male with a medical history of CAD, CHF, diabetes, and s/p pacemaker who present to the ED with swelling of his tongue x 30 min prior to presentation. Of note, patient does have a known history of angioedema of the tongue thought to be secondary to his Sherryll Burger which he is now off. Denies any new foods, meds, or exposures. He was admitted for further work-up. Pharmacy has been consulted to continue patient on home warfarin while inpatient.   Home dose: Warfarin 5 mg PO Tuesday/Thursday and 2.5 mg all other days INR goal: 2-3 Last dose: 1/31 AM Baseline INR: 1.2  Date INR Warfarin Dose  1/31 1.2 1 mg (ordered), 2.5 mg taken PTA  2/1 1.2 5 mg      Goal of Therapy:  INR 2-3 Monitor platelets by anticoagulation protocol: Yes   Plan:  INR is subtherapeutic. Will give warfarin 5 mg x 1 tonight. Daily INR. CBC at least every 3 days.   Ronnald Ramp, PharmD Clinical Pharmacist  02/06/2023,12:38 PM

## 2023-02-06 NOTE — Progress Notes (Signed)
Patient came out in hallway requesting that staff call his wife. RN attempted to call wife with no success.

## 2023-02-15 ENCOUNTER — Ambulatory Visit: Payer: Medicare HMO | Attending: Internal Medicine

## 2023-02-15 DIAGNOSIS — Z95810 Presence of automatic (implantable) cardiac defibrillator: Secondary | ICD-10-CM

## 2023-02-15 DIAGNOSIS — I5022 Chronic systolic (congestive) heart failure: Secondary | ICD-10-CM | POA: Diagnosis not present

## 2023-02-19 NOTE — Progress Notes (Signed)
EPIC Encounter for ICM Monitoring  Patient Name: Adam Douglas is a 73 y.o. male Date: 02/19/2023 Primary Care Physican: Louis Matte, MD Primary Cardiologist: Mariah Milling Electrophysiologist: Joycelyn Schmid Pacing: 97.5%         09/03/2022 Office Weight: 246 lbs                                                             Transmission reviewed.  ED & Hospitalization for allergic reaction   Optivol thoracic impedance suggesting possible fluid accumulation starting 1/19 and returned close to baseline 2/4 which correlates with hospitalization.     Prescribed:   Furosemide 40 mg Take one tablet (40mg ) by mouth once daily, you may take one additional tablet (40mg ) once daily as needed for swelling or increase shortness of breath. Spironolactone 25 mg take 1 tablet (25 mg total) daily   Labs: 02/06/2023 Creatinine 1.23, BUN 22, Potassium 4.5, Sodium 133, GFR >60  02/05/2023 Creatinine 1.23, BUN 21, Potassium 3.4, Sodium 138, GFR >60  02/03/2023 Creatinine 1.25, BUN 26, Potassium 3.5, Sodium 139, GFR >60  A complete set of results can be found in Results Review..   Recommendations:  No changes.    Follow-up plan: ICM clinic phone appointment on 03/22/2023.    91 day device clinic remote transmission 03/12/2023.      EP/Cardiology Next Office Visit:  Recall 08/04/2023 with Dr Graciela Husbands.  Canceled 11/17/2022 with Dr Mariah Milling.   Copy of ICM check sent to Dr. Graciela Husbands.    3 month ICM trend: 02/15/2023.    12-14 Month ICM trend:     Karie Soda, RN 02/19/2023 12:26 PM

## 2023-02-28 NOTE — Progress Notes (Signed)
 HST 09/15/22 showed severe OSA, AHI 30.4/hour. Patient agreeing to start CPAP. Orders placed for auto CPAP 5-20cm h20. DME [facility].

## 2023-03-12 ENCOUNTER — Ambulatory Visit (INDEPENDENT_AMBULATORY_CARE_PROVIDER_SITE_OTHER): Payer: Medicare HMO

## 2023-03-12 DIAGNOSIS — I447 Left bundle-branch block, unspecified: Secondary | ICD-10-CM | POA: Diagnosis not present

## 2023-03-14 LAB — CUP PACEART REMOTE DEVICE CHECK
Battery Remaining Longevity: 42 mo
Battery Voltage: 2.96 V
Brady Statistic AP VP Percent: 56.63 %
Brady Statistic AP VS Percent: 0.71 %
Brady Statistic AS VP Percent: 42.06 %
Brady Statistic AS VS Percent: 0.6 %
Brady Statistic RA Percent Paced: 57.32 %
Brady Statistic RV Percent Paced: 63.53 %
Date Time Interrogation Session: 20250307043824
HighPow Impedance: 65 Ohm
Implantable Lead Connection Status: 753985
Implantable Lead Connection Status: 753985
Implantable Lead Connection Status: 753985
Implantable Lead Implant Date: 20200313
Implantable Lead Implant Date: 20200313
Implantable Lead Implant Date: 20200313
Implantable Lead Location: 753858
Implantable Lead Location: 753859
Implantable Lead Location: 753860
Implantable Lead Model: 4396
Implantable Lead Model: 5076
Implantable Lead Model: 6935
Implantable Pulse Generator Implant Date: 20200313
Lead Channel Impedance Value: 247 Ohm
Lead Channel Impedance Value: 342 Ohm
Lead Channel Impedance Value: 342 Ohm
Lead Channel Impedance Value: 456 Ohm
Lead Channel Impedance Value: 532 Ohm
Lead Channel Impedance Value: 893 Ohm
Lead Channel Pacing Threshold Amplitude: 0.5 V
Lead Channel Pacing Threshold Amplitude: 0.75 V
Lead Channel Pacing Threshold Amplitude: 1 V
Lead Channel Pacing Threshold Pulse Width: 0.4 ms
Lead Channel Pacing Threshold Pulse Width: 0.4 ms
Lead Channel Pacing Threshold Pulse Width: 0.4 ms
Lead Channel Sensing Intrinsic Amplitude: 2.625 mV
Lead Channel Sensing Intrinsic Amplitude: 2.625 mV
Lead Channel Sensing Intrinsic Amplitude: 7.25 mV
Lead Channel Sensing Intrinsic Amplitude: 7.25 mV
Lead Channel Setting Pacing Amplitude: 1.25 V
Lead Channel Setting Pacing Amplitude: 1.5 V
Lead Channel Setting Pacing Amplitude: 2 V
Lead Channel Setting Pacing Pulse Width: 0.4 ms
Lead Channel Setting Pacing Pulse Width: 0.4 ms
Lead Channel Setting Sensing Sensitivity: 0.3 mV
Zone Setting Status: 755011
Zone Setting Status: 755011

## 2023-03-22 ENCOUNTER — Ambulatory Visit: Payer: Medicare HMO | Attending: Internal Medicine

## 2023-03-22 DIAGNOSIS — Z95810 Presence of automatic (implantable) cardiac defibrillator: Secondary | ICD-10-CM

## 2023-03-22 DIAGNOSIS — I5022 Chronic systolic (congestive) heart failure: Secondary | ICD-10-CM | POA: Diagnosis not present

## 2023-03-26 NOTE — Progress Notes (Signed)
 EPIC Encounter for ICM Monitoring  Patient Name: Adam Douglas is a 73 y.o. male Date: 03/26/2023 Primary Care Physican: Sherrin Daisy Danton Clap, MD Primary Cardiologist: Mariah Milling Electrophysiologist: Joycelyn Schmid Pacing: 98.7%         09/03/2022 Office Weight: 246 lbs 03/17/2023 Office Weight: 234 lbs                                                             Transmission results reviewed.   Optivol thoracic impedance suggesting intermittent days with possible fluid accumulation within the last month.     Prescribed:   Furosemide 40 mg Take one tablet (40mg ) by mouth once daily, you may take one additional tablet (40mg ) once daily as needed for swelling or increase shortness of breath. Spironolactone 25 mg take 1 tablet (25 mg total) daily   Labs: 02/06/2023 Creatinine 1.23, BUN 22, Potassium 4.5, Sodium 133, GFR >60  02/05/2023 Creatinine 1.23, BUN 21, Potassium 3.4, Sodium 138, GFR >60  02/03/2023 Creatinine 1.25, BUN 26, Potassium 3.5, Sodium 139, GFR >60  A complete set of results can be found in Results Review.   Recommendations:  No changes.    Follow-up plan: ICM clinic phone appointment on 04/26/2023.    91 day device clinic remote transmission 06/11/2023.      EP/Cardiology Next Office Visit:  Recall 08/04/2023 with Dr Graciela Husbands.  Recall 10/03/2022 with Dr Mariah Milling.   Copy of ICM check sent to Dr. Graciela Husbands.    3 month ICM trend: 03/22/2023.    12-14 Month ICM trend:     Karie Soda, RN 03/26/2023 8:55 AM

## 2023-04-13 NOTE — Progress Notes (Signed)
 Remote ICD transmission.

## 2023-04-13 NOTE — Addendum Note (Signed)
 Addended by: Elease Etienne A on: 04/13/2023 11:28 AM   Modules accepted: Orders

## 2023-04-17 ENCOUNTER — Encounter: Payer: Self-pay | Admitting: Internal Medicine

## 2023-04-22 ENCOUNTER — Telehealth: Payer: Self-pay | Admitting: Cardiovascular Disease

## 2023-04-22 NOTE — Telephone Encounter (Signed)
 Pharmacy please advise on holding Coumadin prior to caudal ESI scheduled for TBD. Thank you.

## 2023-04-22 NOTE — Telephone Encounter (Signed)
   Pre-operative Risk Assessment    Patient Name: Adam Douglas  DOB: [birth year] MRN: [id number]   Date of last office visit: 10/03/2021 Date of next office visit: n/a   Request for Surgical Clearance    Procedure:   Caudal Epidural Steroid Injection  Date of Surgery:  Clearance TBD                                Surgeon:  Dr. Nadene Aures Surgeon's Group or Practice Name:  [facility] Phone number:  [phone] Fax number:  [phone]   Type of Clearance Requested:   - Pharmacy:  Hold Warfarin (Coumadin) as directed   Type of Anesthesia:  Not Indicated   Additional requests/questions:    SignedGenny Kid Schools   04/22/2023, 3:52 PM

## 2023-04-26 ENCOUNTER — Ambulatory Visit: Attending: Internal Medicine

## 2023-04-26 DIAGNOSIS — I5022 Chronic systolic (congestive) heart failure: Secondary | ICD-10-CM

## 2023-04-26 DIAGNOSIS — Z95810 Presence of automatic (implantable) cardiac defibrillator: Secondary | ICD-10-CM | POA: Diagnosis not present

## 2023-04-26 NOTE — Telephone Encounter (Signed)
 Patient with diagnosis of atrial fibrillation on warfarin for anticoagulation.    Procedure:   Caudal Epidural Steroid Injection   Date of Surgery:  Clearance TBD   CHA2DS2-VASc Score = 7   This indicates a 11.2% annual risk of stroke. 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    Patient will not need bridging with Lovenox  (enoxaparin ) around procedure.  **This guidance is not considered finalized until pre-operative APP has relayed final recommendations.**

## 2023-04-26 NOTE — Telephone Encounter (Signed)
   Patient Name: Adam Douglas  DOB: 1950/07/08 MRN: [id number]  Primary Cardiologist: Belva Boyden, MD  Clinical pharmacists have reviewed the patient's past medical history, labs, and current medications as part of preoperative protocol coverage. The following recommendations have been made:  Patient with diagnosis of atrial fibrillation on warfarin for anticoagulation.     Procedure:   Caudal Epidural Steroid Injection   Date of Surgery:  Clearance TBD     CHA2DS2-VASc Score = 7  This indicates a 11.2% annual risk of stroke. 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     Patient will not need bridging with Lovenox  (enoxaparin ) around procedure. Please resume warfarin as soon as possible postprocedure, at the discretion of the surgeon.   I will route this recommendation to the requesting party via Epic fax function and remove from pre-op pool.  Please call with questions.  Jude Norton, NP 04/26/2023, 3:27 PM\

## 2023-04-28 NOTE — Progress Notes (Signed)
 EPIC Encounter for ICM Monitoring  Patient Name: Adam Douglas is a 73 y.o. male Date: 04/28/2023 Primary Care Physican: Orlinda Blackbird Iantha Mainland, MD Primary Cardiologist: Jerelene Monday Electrophysiologist: Victorino Grates Pacing: 98.4%         09/03/2022 Office Weight: 246 lbs 03/17/2023 Office Weight: 234 lbs                                                            Transmission results reviewed.   Optivol thoracic impedance suggesting intermittent days with possible fluid accumulation within the last month.     Prescribed:   Furosemide  40 mg Take one tablet (40mg ) by mouth once daily, you may take one additional tablet (40mg ) once daily as needed for swelling or increase shortness of breath. Spironolactone  25 mg take 1 tablet (25 mg total) daily   Labs: 02/06/2023 Creatinine 1.23, BUN 22, Potassium 4.5, Sodium 133, GFR >60  02/05/2023 Creatinine 1.23, BUN 21, Potassium 3.4, Sodium 138, GFR >60  02/03/2023 Creatinine 1.25, BUN 26, Potassium 3.5, Sodium 139, GFR >60  A complete set of results can be found in Results Review.   Recommendations:  No changes.    Follow-up plan: ICM clinic phone appointment on 06/01/2023.    91 day device clinic remote transmission 06/11/2023.      EP/Cardiology Next Office Visit:  Recall 08/04/2023 with Dr Rodolfo Clan.  04/29/2023 with Odessa Bene, NP.   Copy of ICM check sent to Dr. Rodolfo Clan.    3 month ICM trend: 04/26/2023.    12-14 Month ICM trend:     Almyra Jain, RN 04/28/2023 2:40 PM

## 2023-04-29 ENCOUNTER — Encounter: Payer: Self-pay | Admitting: Nurse Practitioner

## 2023-04-29 ENCOUNTER — Ambulatory Visit: Attending: Nurse Practitioner | Admitting: Nurse Practitioner

## 2023-04-29 VITALS — BP 160/68 | HR 62 | Ht 68.0 in | Wt 236.6 lb

## 2023-04-29 DIAGNOSIS — I5032 Chronic diastolic (congestive) heart failure: Secondary | ICD-10-CM

## 2023-04-29 DIAGNOSIS — Z01810 Encounter for preprocedural cardiovascular examination: Secondary | ICD-10-CM

## 2023-04-29 DIAGNOSIS — E785 Hyperlipidemia, unspecified: Secondary | ICD-10-CM

## 2023-04-29 DIAGNOSIS — I480 Paroxysmal atrial fibrillation: Secondary | ICD-10-CM | POA: Diagnosis not present

## 2023-04-29 DIAGNOSIS — I255 Ischemic cardiomyopathy: Secondary | ICD-10-CM | POA: Diagnosis not present

## 2023-04-29 DIAGNOSIS — I10 Essential (primary) hypertension: Secondary | ICD-10-CM

## 2023-04-29 DIAGNOSIS — E1169 Type 2 diabetes mellitus with other specified complication: Secondary | ICD-10-CM

## 2023-04-29 MED ORDER — FUROSEMIDE 20 MG PO TABS
20.0000 mg | ORAL_TABLET | Freq: Every day | ORAL | 3 refills | Status: DC
Start: 2023-04-29 — End: 2023-06-16

## 2023-04-29 MED ORDER — FUROSEMIDE 40 MG PO TABS: ORAL_TABLET | ORAL | 3 refills | Status: AC

## 2023-04-29 NOTE — Patient Instructions (Signed)
 Medication Instructions:  Your physician recommends the following medication changes.  START TAKING: 20 mg lasix . Along with 40 mg lasix . Totaling 60 mg and as needed for weight gain of over 3 lb in 24 hours or 5 lbs in one week.   *If you need a refill on your cardiac medications before your next appointment, please call your pharmacy*  Lab Work: Your provider would like for you to return in 1 week to have the following labs drawn: BMP.   Please go to [facility] [address] ([facility]) [address], [place] [address] You do not need an appointment.  They are open from 8 am- 4:30 pm.  Lunch from 1:00 pm- 2:00 pm You DO NOT need to be fasting.  Testing/Procedures: Your physician has requested that you have an echocardiogram. Echocardiography is a painless test that uses sound waves to create images of your heart. It provides your doctor with information about the size and shape of your heart and how well your heart's chambers and valves are working.   You may receive an ultrasound enhancing agent through an IV if needed to better visualize your heart during the echo. This procedure takes approximately one hour.  There are no restrictions for this procedure.  This will take place at [address] [facility]) [address], [place] [address]  Please note: We ask at that you not bring children with you during ultrasound (echo/ vascular) testing. Due to room size and safety concerns, children are not allowed in the ultrasound rooms during exams. Our front office staff cannot provide observation of children in our lobby area while testing is being conducted. An adult accompanying a patient to their appointment will only be allowed in the ultrasound room at the discretion of the ultrasound technician under special circumstances. We apologize for any inconvenience.   Follow-Up: At [facility], you and your health needs are our priority.  As part of our  continuing mission to provide you with exceptional heart care, our providers are all part of one team.  This team includes your primary Cardiologist (physician) and Advanced Practice Providers or APPs (Physician Assistants and Nurse Practitioners) who all work together to provide you with the care you need, when you need it.  Your next appointment:   6 week(s)  Provider:   You will see Laneta Pintos, NP  We recommend signing up for the patient portal called "MyChart".  Sign up information is provided on this After Visit Summary.  MyChart is used to connect with patients for Virtual Visits (Telemedicine).  Patients are able to view lab/test results, encounter notes, upcoming appointments, etc.  Non-urgent messages can be sent to your provider as well.   To learn more about what you can do with MyChart, go to [email or web address].

## 2023-04-29 NOTE — Progress Notes (Signed)
 Office Visit    Patient Name: Adam Douglas Date of Encounter: 04/29/2023  Primary Care Provider:  Benuel Brazier, MD Primary Cardiologist:  Belva Boyden, MD  Chief Complaint    73 y.o. 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  Past Medical History  Subjective   Past Medical History:  Diagnosis Date   Angioedema    a. 04/2021 - presumed to be 2/2 entresto ; b. 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   CAD (coronary artery disease)    a. 02/2017 NSTEMI/Cath: LM 40d, LAD 24m, 3m/d, D2 small w/ sev prox/m dzs, LCX nl, OM1 sev diff dzs - small vessel, OM2 80-90p/m, RCA dominant 83m, RPDA 50, EF <20%-->Med Rx.   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 02/2017 Echo: EF 20-25%; b. 03/2018 Echo: EF 30-35%, mild conc LVH. Impaired relaxation. Nl RV fxn. Mild BAE. Mild to mod MR. Mild Ao root dil; c. 09/2020 Echo: EF 50%, glob HK, mod LVH. GrI DD. Nl RV size/fxn. Mod dil LA. Mild MR.   Hyperlipidemia    Hypertension    Ischemic cardiomyopathy    a. 02/2017 Echo: EF 20-25%; b. 03/2018 Echo: EF 30-35%; c. 03/2018 s/p MDT [id number] [place] MRI CRT-D; d. 09/2020 Echo: EF 50%.   LBBB (left bundle branch block)    a. Noted 02/2017. Pt denies any known prior history of LBBB (not present on 2014 [facility] ECG interpretation).   Morbid obesity (HCC)    Pacemaker    Stroke [facility])    a. Ocular strokes x 3 - prev eval in [facility] for first 2, [facility] for last one.   Past Surgical History:  Procedure Laterality Date   BIV ICD INSERTION CRT-D N/A 03/18/2018   Procedure: BIV ICD INSERTION CRT-D;  Surgeon: Verona Goodwill, MD;  Location: [facility] INVASIVE CV LAB;   Service: Cardiovascular;  Laterality: N/A;   CERVICAL DISC SURGERY     CHOLECYSTECTOMY     LEFT HEART CATH AND CORONARY ANGIOGRAPHY N/A 03/03/2017   Procedure: LEFT HEART CATH AND CORONARY ANGIOGRAPHY;  Surgeon: Devorah Fonder, MD;  Location: [facility] INVASIVE CV LAB;  Service: Cardiovascular;  Laterality: N/A;    Allergies  Allergies  Allergen Reactions   Sacubitril -Valsartan  Swelling    Of tongue Of tongue Entresto    Ivp Dye [Iodinated Contrast Media] Swelling      History of Present Illness      73 y.o. y/o male with a history of CAD, chronic heart failure with improved ejection fraction, ischemic cardiomyopathy status post biventricular AICD, diabetes, hypertension, hyperlipidemia, ocular strokes, obesity, and paroxysmal atrial fibrillation.  He suffered a non-STEMI in February 2019, with diagnostic catheterization revealing moderate to severe diffuse, small vessel CAD with an EF of less than 20% by ventriculography. Echo showed an EF of 20 to 25%. Medical therapy was recommended given diffuse nature of disease. Despite guideline directed medical therapy, patient continued to have reduced LV function, with an EF of 30 to 35% by echo in March 2020 and subsequently underwent CRT-D. He was later diagnosed with paroxysmal atrial fibrillation and has been chronically anticoagulated on warfarin.   Follow-up echocardiogram in September 2022 showed improvement in EF to 50%.  In April 2023, he was  admitted with angioedema which was felt to be secondary to Entresto , prompting discontinuation.     [name] was last seen in electrophysiology clinic in July 2024.  At that time, he reported weight gain secondary to deconditioning and associated dyspnea on exertion.  His wife also reported apneic episodes during sleep.  He was referred for sleep study which was performed in September 2024 and showed severe obstructive sleep apnea with recommendation for CPAP.   Since his last visit, he has noted  ongoing and progressive DOE.  He thinks some of this may be deconditioning but also notes more frequent episodes of lower extremity swelling.  He takes Lasix  40 mg daily and has never taken an additional dose. Recent OptiVol recording on April 21 showed frequent episodes of increase volume dating back to January 20.  In all, he has been more volume up than euvolemic.  He denies chest pain, palpitations, PND, orthopnea, dizziness, syncope, or early satiety.  He believes his diet is relatively low with salt as his wife usually cooks at home and cooks from [employer].  He has chronic left leg pain in the setting of spinal stenosis and lumbar radiculopathy.  He is pending caudal epidural steroid injection.  Patient evaluated by our pharmacy team with recommendation to hold warfarin for 5 days prior to procedure without need for Lovenox  bridging.   Objective  Home Medications    Current Outpatient Medications  Medication Sig Dispense Refill   acetaminophen  (TYLENOL ) 650 MG CR tablet Take 1,300 mg by mouth 2 (two) times daily.     albuterol  (VENTOLIN  HFA) 108 (90 Base) MCG/ACT inhaler 2 puffs Inhalation every 4 hours as needed for shortness of breath or wheezing     aspirin  EC 81 MG tablet Take 1 tablet (81 mg total) by mouth daily. 90 tablet 3   atorvastatin  (LIPITOR) 80 MG tablet Take 80 mg by mouth daily.     Blood Glucose Monitoring Suppl (HEALTHPRO BLOOD GLUCOSE MONITO) w/Device KIT use device to check blood sugars. for diabetes.     carvedilol  (COREG ) 12.5 MG tablet Take 1 tablet by mouth twice daily 180 tablet 0   citalopram  (CELEXA ) 40 MG tablet Take 40 mg by mouth daily.     EPINEPHrine  0.3 mg/0.3 mL IJ SOAJ injection Inject 0.3 mg into the muscle as needed for up to 1 dose for anaphylaxis. 2 each 0   furosemide  (LASIX ) 20 MG tablet Take 1 tablet (20 mg total) by mouth daily. Along with 40 mg lasix . Totaling 60 mg. You may take an additional 20 mg as needed for weight gain of over 3 lb in 24 hours  or 5 lbs in one week. 90 tablet 3   gabapentin (NEURONTIN) 100 MG capsule Take 100 mg by mouth at bedtime.     insulin  aspart (NOVOLOG ) 100 UNIT/ML injection Inject 10 Units into the skin 3 (three) times daily with meals.     LANTUS  SOLOSTAR 100 UNIT/ML Solostar Pen Inject 40 Units into the skin 2 (two) times daily.     metFORMIN  (GLUCOPHAGE -XR) 500 MG 24 hr tablet Take 500 mg by mouth 2 (two) times daily.     nitroGLYCERIN  (NITROSTAT ) 0.4 MG SL tablet PLACE 1 TABLET UNDER THE TONGUE EVERY 5 MINUTES AS NEEDED 25 tablet 3   omeprazole (PRILOSEC) 20 MG capsule Take 20 mg by mouth daily.     spironolactone  (ALDACTONE ) 25 MG tablet Take 1 tablet (25 mg total) by mouth daily. 90 tablet 3   traZODone  (DESYREL )  50 MG tablet Take 50 mg by mouth at bedtime.     warfarin (COUMADIN ) 2.5 MG tablet Take 2.5 mg by mouth. Take 2.5 mg by mouth in the morning on Monday, Wednesday, Friday, Saturday, and Sunday     warfarin (COUMADIN ) 5 MG tablet TAKE 1 TO 2 TABLETS BY MOUTH ONCE DAILY OR AS DIRECTED BY THE COUMADIN  CLINIC (Patient taking differently: Take 5 mg by mouth. Take 5 mg by mouth in the morning on Tuesday, and Thursday) 90 tablet 1   amLODipine  (NORVASC ) 10 MG tablet Take 1 tablet (10 mg total) by mouth daily. 90 tablet 1   cyclobenzaprine (FLEXERIL) 10 MG tablet Take 10 mg by mouth 3 (three) times daily as needed for muscle spasms. (Patient not taking: Reported on 04/29/2023)     famotidine  (PEPCID ) 20 MG tablet Take 1 tablet (20 mg total) by mouth 2 (two) times daily for 10 days. 20 tablet 0   furosemide  (LASIX ) 40 MG tablet TAKE 1 TABLET BY MOUTH ONCE DAILY 90 tablet 3   loratadine  (CLARITIN ) 10 MG tablet Take 1 tablet (10 mg total) by mouth daily for 10 days. 10 tablet 0   oxyCODONE -acetaminophen  (PERCOCET/ROXICET) 5-325 MG tablet Take 1 tablet by mouth 4 (four) times daily as needed. (Patient not taking: Reported on 04/29/2023)     traMADol  (ULTRAM ) 50 MG tablet Take 50-100 mg by mouth every 8 (eight)  hours as needed for pain. (Patient not taking: Reported on 04/29/2023)     No current facility-administered medications for this visit.     Physical Exam    VS:  BP (!) 160/68   Pulse 62   Ht 5\' 8"  (1.727 m)   Wt 236 lb 9.6 oz (107.3 kg)   SpO2 94%   BMI 35.97 kg/m  , BMI Body mass index is 35.97 kg/m.     Vitals:   04/29/23 1106 04/29/23 1140  BP: (!) 146/78 (!) 160/68  Pulse: 62   SpO2: 94%       GEN: Well nourished, well developed, in no acute distress. HEENT: normal. Neck: Supple, no JVD, carotid bruits, or masses. Cardiac: RRR, no murmurs, rubs, or gallops. No clubbing, cyanosis, 1+ bilateral ankle and lower calf edema.  Radials 2+/PT 2+ and equal bilaterally.  Respiratory:  Respirations regular and unlabored, clear to auscultation bilaterally. GI: Obese, soft, nontender, nondistended, BS + x 4. MS: no deformity or atrophy. Skin: warm and dry, no rash. Neuro:  Strength and sensation are intact. Psych: Normal affect.  Accessory Clinical Findings    ECG personally reviewed by me today - EKG Interpretation Date/Time:  Thursday April 29 2023 11:14:03 EDT Ventricular Rate:  62 PR Interval:  194 QRS Duration:  116 QT Interval:  430 QTC Calculation: 436 R Axis:   -68  Text Interpretation: AV dual-paced rhythm Biventricular pacemaker detected Confirmed by Laneta Pintos [phone]) on 04/29/2023 11:18:24 AM  - no acute changes.  Lab Results  Component Value Date   WBC 13.0 (H) 02/06/2023   HGB 12.3 (L) 02/06/2023   HCT 34.8 (L) 02/06/2023   MCV 89.5 02/06/2023   PLT 220 02/06/2023   Lab Results  Component Value Date   CREATININE 1.23 02/06/2023   BUN 22 02/06/2023   NA 133 (L) 02/06/2023   K 4.5 02/06/2023   CL 104 02/06/2023   CO2 19 (L) 02/06/2023   Lab Results  Component Value Date   ALT 22 02/05/2023   AST 28 02/05/2023   ALKPHOS 75 02/05/2023  BILITOT 0.7 02/05/2023   Lab Results  Component Value Date   CHOL 94 03/04/2017   HDL 35 (L)  03/04/2017   LDLCALC 36 03/04/2017   TRIG 117 03/04/2017   CHOLHDL 2.7 03/04/2017    Lab Results  Component Value Date   HGBA1C 7.7 (H) 02/05/2023   Lab Results  Component Value Date   TSH 0.895 03/03/2017       3 month ICM trend: 04/26/2023.    _____________   Assessment & Plan    1.  Acute on chronic heart failure with improved ejection fraction/ischemic cardiomyopathy: Patient with a history of LV dysfunction with an EF previously as low as 20 to 25% with subsequent placement of biventricular defibrillator and improved EF to 50% with global hypokinesis and grade 1 diastolic dysfunction by echo in September 2022.  His chronic dyspnea on exertion though notes that over the past 6 months to year, this has progressed.  He can walk about 100 yards prior to feeling dyspnea and needing to rest.  He has also noted more frequent lower extremity swelling.  Recent OptiVol recording shows that he predominantly lives in a fluid-up state with intermittent returns to baseline.  There is prescribed Lasix  40 mg daily with an additional dose as needed, he never takes an additional dose.  I am increasing his Lasix  to 60 mg daily.  Plan for follow-up basic metabolic panel in 1 week.  Given progression of symptoms, follow-up echo.  Continue carvedilol  and spironolactone .  Previous history of angioedema on Entresto .  Pending echo, will consider addition of BiDil and SGLT2 inhibitor.  2.  Coronary artery disease: Multivessel CAD at catheterization in 2019 for which she has been medically managed.  Progressive dyspnea was outlined above.  No chest pain.  Follow-up echo.  Continue aspirin , beta-blocker, and statin therapy.  3.  Primary hypertension: Blood pressure elevated today on 2 consecutive recordings.  Increasing furosemide  to 60 mg daily in the setting of known volume overload.  Patient will continue to follow blood pressures at home (notes that they are typically in the 130s to 140s).  Continue  carvedilol , amlodipine , and spironolactone .  As above, will consider BiDil in the future.  4.  Hyperlipidemia: Followed by his primary care provider.  He remains on statin therapy.  LFTs were normal earlier this year.  5.  Paroxysmal atrial fibrillation: AV paced today.  He remains on carvedilol  and is anticoagulated with warfarin.  He is pending a epidural injection and is aware that he may hold warfarin for 5 days prior to the procedure without bridging.  6.  Pre-procedure cardiovascular exam: As above, patient pending epidural injection in the setting of spinal stenosis and lumbar radiculopathy.  He is low risk for cardiac complications related to pending epidural injection and does not require any ischemic testing prior to the procedure.  As above, he may hold warfarin for 5 days prior to the procedure without bridging.  He should resume warfarin once deemed safe by the procedural team.  7.  Type 2 diabetes mellitus: Followed by primary care.  On insulin .  Will consider SGLT2 inhibitor at follow-up.    8.  Disposition: Follow-up basic metabolic panel in 1 week.  Follow-up echocardiogram.  Follow-up in clinic in 6 weeks or sooner if necessary.  Laneta Pintos, NP 04/29/2023, 12:18 PM

## 2023-05-06 ENCOUNTER — Other Ambulatory Visit: Payer: Self-pay

## 2023-05-06 DIAGNOSIS — I5032 Chronic diastolic (congestive) heart failure: Secondary | ICD-10-CM

## 2023-05-07 LAB — BASIC METABOLIC PANEL WITH GFR
BUN/Creatinine Ratio: 25 — ABNORMAL HIGH (ref 10–24)
BUN: 32 mg/dL — ABNORMAL HIGH (ref 8–27)
CO2: 18 mmol/L — ABNORMAL LOW (ref 20–29)
Calcium: 9.1 mg/dL (ref 8.6–10.2)
Chloride: 105 mmol/L (ref 96–106)
Creatinine, Ser: 1.27 mg/dL (ref 0.76–1.27)
Glucose: 86 mg/dL (ref 70–99)
Potassium: 4.4 mmol/L (ref 3.5–5.2)
Sodium: 139 mmol/L (ref 134–144)
eGFR: 60 mL/min/{1.73_m2} (ref >59)

## 2023-05-10 ENCOUNTER — Encounter: Payer: Self-pay | Admitting: *Deleted

## 2023-06-01 ENCOUNTER — Ambulatory Visit: Attending: Cardiology

## 2023-06-01 DIAGNOSIS — Z95810 Presence of automatic (implantable) cardiac defibrillator: Secondary | ICD-10-CM | POA: Diagnosis not present

## 2023-06-01 DIAGNOSIS — I5022 Chronic systolic (congestive) heart failure: Secondary | ICD-10-CM

## 2023-06-04 ENCOUNTER — Telehealth: Payer: Self-pay

## 2023-06-04 NOTE — Telephone Encounter (Signed)
Remote ICM transmission received.  Attempted call to wife/patient regarding ICM remote transmission and no answer.

## 2023-06-04 NOTE — Progress Notes (Signed)
 EPIC Encounter for ICM Monitoring  Patient Name: Adam Douglas is a 73 y.o. male Date: 06/04/2023 Primary Care Physican: Orlinda Blackbird Iantha Mainland, MD Primary Cardiologist: Jerelene Monday Electrophysiologist: Gareth Junes Pacing: 98.4%         09/03/2022 Office Weight: 246 lbs 03/17/2023 Office Weight: 234 lbs     04/29/2023 Office Weight: 236 lbs                                                        Attempted call to patient and unable to reach.   Transmission results reviewed.     Optivol thoracic impedance suggesting fluid levels trending more along baseline since Lasix  increased to 60 mg day at 4/24 OV.     Prescribed:   Furosemide  40 mg Take one tablet (40mg ) by mouth once daily,  Furosemide  20 mg take 1 tablet by mouth daily along with 40 mg lasix , totaling 60 mg daily.  You may take one additional 20 mg daily as needed for weight gain of over 3 lbs in 24 hrs or 5 lbs in a week. Spironolactone  25 mg take 1 tablet (25 mg total) daily   Labs: 02/06/2023 Creatinine 1.23, BUN 22, Potassium 4.5, Sodium 133, GFR >60  02/05/2023 Creatinine 1.23, BUN 21, Potassium 3.4, Sodium 138, GFR >60  02/03/2023 Creatinine 1.25, BUN 26, Potassium 3.5, Sodium 139, GFR >60  A complete set of results can be found in Results Review.   Recommendations:  Unable to reach.     Follow-up plan: ICM clinic phone appointment on 07/19/2023.    91 day device clinic remote transmission 06/11/2023.      EP/Cardiology Next Office Visit:  Recall 08/04/2023 with Dr Rodolfo Clan.  06/16/2023 with Odessa Bene, NP.   Copy of ICM check sent to Dr. Daneil Dunker.    3 month ICM trend: 06/01/2023.    12-14 Month ICM trend:     Almyra Jain, RN 06/04/2023 7:41 AM

## 2023-06-08 ENCOUNTER — Ambulatory Visit: Payer: Self-pay | Admitting: Nurse Practitioner

## 2023-06-08 ENCOUNTER — Ambulatory Visit: Attending: Nurse Practitioner

## 2023-06-08 DIAGNOSIS — I5032 Chronic diastolic (congestive) heart failure: Secondary | ICD-10-CM

## 2023-06-08 LAB — ECHOCARDIOGRAM COMPLETE
AR max vel: 3.23 cm2
AV Area VTI: 3.4 cm2
AV Area mean vel: 3.05 cm2
AV Mean grad: 3.3 mmHg
AV Peak grad: 6.2 mmHg
Ao pk vel: 1.24 m/s
Area-P 1/2: 2.39 cm2
S' Lateral: 3.4 cm

## 2023-06-11 ENCOUNTER — Ambulatory Visit (INDEPENDENT_AMBULATORY_CARE_PROVIDER_SITE_OTHER): Payer: Medicare HMO

## 2023-06-11 DIAGNOSIS — I255 Ischemic cardiomyopathy: Secondary | ICD-10-CM

## 2023-06-12 LAB — CUP PACEART REMOTE DEVICE CHECK
Battery Remaining Longevity: 36 mo
Battery Voltage: 2.95 V
Brady Statistic AP VP Percent: 36.41 %
Brady Statistic AP VS Percent: 0.43 %
Brady Statistic AS VP Percent: 62.27 %
Brady Statistic AS VS Percent: 0.88 %
Brady Statistic RA Percent Paced: 36.82 %
Brady Statistic RV Percent Paced: 49.6 %
Date Time Interrogation Session: 20250606033423
HighPow Impedance: 65 Ohm
Implantable Lead Connection Status: 753985
Implantable Lead Connection Status: 753985
Implantable Lead Connection Status: 753985
Implantable Lead Implant Date: 20200313
Implantable Lead Implant Date: 20200313
Implantable Lead Implant Date: 20200313
Implantable Lead Location: 753858
Implantable Lead Location: 753859
Implantable Lead Location: 753860
Implantable Lead Model: 4396
Implantable Lead Model: 5076
Implantable Lead Model: 6935
Implantable Pulse Generator Implant Date: 20200313
Lead Channel Impedance Value: 247 Ohm
Lead Channel Impedance Value: 285 Ohm
Lead Channel Impedance Value: 304 Ohm
Lead Channel Impedance Value: 456 Ohm
Lead Channel Impedance Value: 475 Ohm
Lead Channel Impedance Value: 817 Ohm
Lead Channel Pacing Threshold Amplitude: 0.5 V
Lead Channel Pacing Threshold Amplitude: 0.75 V
Lead Channel Pacing Threshold Amplitude: 1 V
Lead Channel Pacing Threshold Pulse Width: 0.4 ms
Lead Channel Pacing Threshold Pulse Width: 0.4 ms
Lead Channel Pacing Threshold Pulse Width: 0.4 ms
Lead Channel Sensing Intrinsic Amplitude: 2.25 mV
Lead Channel Sensing Intrinsic Amplitude: 2.25 mV
Lead Channel Sensing Intrinsic Amplitude: 8 mV
Lead Channel Sensing Intrinsic Amplitude: 8 mV
Lead Channel Setting Pacing Amplitude: 1.25 V
Lead Channel Setting Pacing Amplitude: 1.5 V
Lead Channel Setting Pacing Amplitude: 2 V
Lead Channel Setting Pacing Pulse Width: 0.4 ms
Lead Channel Setting Pacing Pulse Width: 0.4 ms
Lead Channel Setting Sensing Sensitivity: 0.3 mV
Zone Setting Status: 755011
Zone Setting Status: 755011

## 2023-06-14 ENCOUNTER — Ambulatory Visit: Payer: Self-pay | Admitting: Cardiology

## 2023-06-16 ENCOUNTER — Ambulatory Visit: Attending: Nurse Practitioner | Admitting: Nurse Practitioner

## 2023-06-16 ENCOUNTER — Encounter: Payer: Self-pay | Admitting: Nurse Practitioner

## 2023-06-16 VITALS — BP 130/52 | HR 61 | Ht 69.0 in | Wt 237.0 lb

## 2023-06-16 DIAGNOSIS — I5032 Chronic diastolic (congestive) heart failure: Secondary | ICD-10-CM

## 2023-06-16 DIAGNOSIS — I10 Essential (primary) hypertension: Secondary | ICD-10-CM

## 2023-06-16 DIAGNOSIS — I480 Paroxysmal atrial fibrillation: Secondary | ICD-10-CM | POA: Diagnosis not present

## 2023-06-16 DIAGNOSIS — E785 Hyperlipidemia, unspecified: Secondary | ICD-10-CM | POA: Diagnosis not present

## 2023-06-16 DIAGNOSIS — I255 Ischemic cardiomyopathy: Secondary | ICD-10-CM

## 2023-06-16 DIAGNOSIS — N183 Chronic kidney disease, stage 3 unspecified: Secondary | ICD-10-CM

## 2023-06-16 DIAGNOSIS — E1169 Type 2 diabetes mellitus with other specified complication: Secondary | ICD-10-CM

## 2023-06-16 MED ORDER — FUROSEMIDE 20 MG PO TABS
20.0000 mg | ORAL_TABLET | ORAL | 3 refills | Status: AC
Start: 2023-06-16 — End: 2023-09-14

## 2023-06-16 NOTE — Progress Notes (Signed)
 Office Visit    Patient Name: Adam Douglas Date of Encounter: 06/16/2023  Primary Care Provider:  Benuel Brazier, MD Primary Cardiologist:  Belva Boyden, MD  Chief Complaint    73 y.o. 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   Past Medical History   Subjective   Past Medical History:  Diagnosis Date   Angioedema    a. 04/2021 - presumed to be 2/2 entresto ; b. 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   CAD (coronary artery disease)    a. 02/2017 NSTEMI/Cath: LM 40d, LAD 75m, 42m/d, D2 small w/ sev prox/m dzs, LCX nl, OM1 sev diff dzs - small vessel, OM2 80-90p/m, RCA dominant 55m, RPDA 50, EF <20%-->Med Rx.   Diabetes mellitus without complication (HCC)    HFimpEF (heart failure with improved ejection fraction) (HCC)    a. 02/2017 Echo: EF 20-25%; b. 03/2018 Echo: EF 30-35%, mild conc LVH. Impaired relaxation. Nl RV fxn. Mild BAE. Mild to mod MR. Mild Ao root dil; c. 09/2020 Echo: EF 50%, glob HK, mod LVH. GrI DD. Nl RV size/fxn. Mod dil LA. Mild MR; d. 06/2023 Echo: EF 55-60%, no rwma, mild LVH, GrI DD, nl RV fxn, mild MR.   Hyperlipidemia    Hypertension    Ischemic cardiomyopathy    a. 02/2017 Echo: EF 20-25%; b. 03/2018 Echo: EF 30-35%; c. 03/2018 s/p MDT [id number] [facility] MRI CRT-D; d. 09/2020 Echo: EF 50%.   LBBB (left bundle branch block)    a. Noted 02/2017. Pt denies any known prior history of LBBB (not present on 2014 [facility] ECG interpretation).   Morbid obesity (HCC)    Pacemaker    Stroke [facility])    a. Ocular strokes x 3 - prev eval in [facility] for first 2, [facility] for last one.   Past Surgical History:  Procedure Laterality Date   BIV ICD INSERTION CRT-D N/A 03/18/2018   Procedure: BIV ICD INSERTION CRT-D;  Surgeon: Verona Goodwill,  MD;  Location: [facility] INVASIVE CV LAB;  Service: Cardiovascular;  Laterality: N/A;   CERVICAL DISC SURGERY     CHOLECYSTECTOMY     LEFT HEART CATH AND CORONARY ANGIOGRAPHY N/A 03/03/2017   Procedure: LEFT HEART CATH AND CORONARY ANGIOGRAPHY;  Surgeon: Devorah Fonder, MD;  Location: [facility] INVASIVE CV LAB;  Service: Cardiovascular;  Laterality: N/A;    Allergies  Allergies  Allergen Reactions   Sacubitril -Valsartan  Swelling    Of tongue Of tongue Entresto    Ivp Dye [Iodinated Contrast Media] Swelling       History of Present Illness      73 y.o. y/o male with a history of CAD, chronic heart failure with improved ejection fraction, ischemic cardiomyopathy status post biventricular AICD, diabetes, hypertension, hyperlipidemia, ocular strokes, obesity, and paroxysmal atrial fibrillation.  He suffered a non-STEMI in February 2019, with diagnostic catheterization revealing moderate to severe diffuse, small vessel CAD with an EF of less than 20% by ventriculography. Echo showed an EF of 20 to 25%. Medical therapy was recommended given diffuse nature of disease. Despite guideline directed medical therapy, patient continued to have reduced LV function, with an EF of 30 to 35% by echo in March 2020 and subsequently underwent CRT-D. He was later diagnosed with paroxysmal atrial fibrillation and has been chronically anticoagulated on warfarin.   Follow-up echocardiogram in September 2022 showed  improvement in EF to 50%.  In April 2023, he was admitted with angioedema which was felt to be secondary to Entresto , prompting discontinuation.       Adam Douglas was last seen in cardiology clinic in April 2025, which time he noted progressive dyspnea on exertion.  We reviewed his OptiVol trends, which showed elevated volumes over the past prior months.  Lasix  was increased to 60 mg daily.  Follow-up echo showed an EF of 55 to 60% with mild LVH, grade 1 diastolic dysfunction, normal RV function, and mild MR.   Follow-up lab work on May 1 showed stable renal function with a creatinine of 1.27.  Follow-up OptiVol on Jun 01, 2023 showed improvement in volume status.  Today, Mr. [name] reports that since increasing his Lasix , he has felt much better.  He has not been having any significant dyspnea or lower extremity edema.  He denies chest pain, palpitations, PND, orthopnea, dizziness, syncope, or early satiety.  He recently had labs drawn through his primary care provider and his creatinine was elevated at 1.58 with a BUN of 32.  No adjustments were made to his medications. Objective   Home Medications    Current Outpatient Medications  Medication Sig Dispense Refill   acetaminophen  (TYLENOL ) 650 MG CR tablet Take 1,300 mg by mouth 2 (two) times daily.     albuterol  (VENTOLIN  HFA) 108 (90 Base) MCG/ACT inhaler 2 puffs Inhalation every 4 hours as needed for shortness of breath or wheezing     amLODipine  (NORVASC ) 10 MG tablet Take 1 tablet (10 mg total) by mouth daily. 90 tablet 1   aspirin  EC 81 MG tablet Take 1 tablet (81 mg total) by mouth daily. 90 tablet 3   atorvastatin  (LIPITOR) 80 MG tablet Take 80 mg by mouth daily.     Blood Glucose Monitoring Suppl (HEALTHPRO BLOOD GLUCOSE MONITO) w/Device KIT use device to check blood sugars. for diabetes.     carvedilol  (COREG ) 12.5 MG tablet Take 1 tablet by mouth twice daily 180 tablet 0   citalopram  (CELEXA ) 40 MG tablet Take 40 mg by mouth daily.     EPINEPHrine  0.3 mg/0.3 mL IJ SOAJ injection Inject 0.3 mg into the muscle as needed for up to 1 dose for anaphylaxis. 2 each 0   furosemide  (LASIX ) 20 MG tablet Take 1 tablet (20 mg total) by mouth daily. Along with 40 mg lasix . Totaling 60 mg. You may take an additional 20 mg as needed for weight gain of over 3 lb in 24 hours or 5 lbs in one week. 90 tablet 3   furosemide  (LASIX ) 40 MG tablet TAKE 1 TABLET BY MOUTH ONCE DAILY 90 tablet 3   insulin  aspart (NOVOLOG ) 100 UNIT/ML injection Inject 10 Units into  the skin 3 (three) times daily with meals.     LANTUS  SOLOSTAR 100 UNIT/ML Solostar Pen Inject 40 Units into the skin 2 (two) times daily.     metFORMIN  (GLUCOPHAGE -XR) 500 MG 24 hr tablet Take 500 mg by mouth 2 (two) times daily.     nitroGLYCERIN  (NITROSTAT ) 0.4 MG SL tablet PLACE 1 TABLET UNDER THE TONGUE EVERY 5 MINUTES AS NEEDED 25 tablet 3   oxyCODONE -acetaminophen  (PERCOCET/ROXICET) 5-325 MG tablet Take 1 tablet by mouth 4 (four) times daily as needed.     pantoprazole  (PROTONIX ) 40 MG tablet Take 40 mg by mouth daily.     spironolactone  (ALDACTONE ) 25 MG tablet Take 1 tablet (25 mg total) by mouth daily. 90 tablet 3  traMADol  (ULTRAM ) 50 MG tablet Take 50-100 mg by mouth every 8 (eight) hours as needed for pain.     traZODone  (DESYREL ) 50 MG tablet Take 50 mg by mouth at bedtime.     warfarin (COUMADIN ) 2.5 MG tablet Take 2.5 mg by mouth. Take 2.5 mg by mouth in the morning on Monday, Wednesday, Friday, Saturday, and Sunday     warfarin (COUMADIN ) 5 MG tablet TAKE 1 TO 2 TABLETS BY MOUTH ONCE DAILY OR AS DIRECTED BY THE COUMADIN  CLINIC (Patient taking differently: Take 5 mg by mouth. Take 5 mg by mouth in the morning on Tuesday, and Thursday) 90 tablet 1   cyclobenzaprine (FLEXERIL) 10 MG tablet Take 10 mg by mouth 3 (three) times daily as needed for muscle spasms. (Patient not taking: Reported on 04/29/2023)     No current facility-administered medications for this visit.     Physical Exam    VS:  BP (!) 130/52 (BP Location: Left Arm, Patient Position: Sitting, Cuff Size: Large)   Pulse 61   Ht 5' 9 (1.753 m)   Wt 237 lb (107.5 kg)   SpO2 98%   BMI 35.00 kg/m  , BMI Body mass index is 35 kg/m.       GEN: Well nourished, well developed, in no acute distress. HEENT: normal. Neck: Supple, no JVD, carotid bruits, or masses. Cardiac: RRR, no murmurs, rubs, or gallops. No clubbing, cyanosis, edema.  Radials 2+/PT 2+ and equal bilaterally.  Respiratory:  Respirations regular and  unlabored, clear to auscultation bilaterally. GI: Obese, protuberant, nontender, nondistended, BS + x 4. MS: no deformity or atrophy. Skin: warm and dry, no rash. Neuro:  Strength and sensation are intact. Psych: Normal affect.  Accessory Clinical Findings    ECG personally reviewed by me today - EKG Interpretation Date/Time:  Wednesday June 16 2023 13:36:54 EDT Ventricular Rate:  61 PR Interval:  182 QRS Duration:  158 QT Interval:  462 QTC Calculation: 465 R Axis:   121  Text Interpretation: AV dual-paced rhythm with frequent ventricular-paced complexes Confirmed by Laneta Pintos [phone]) on 06/16/2023 1:44:25 PM   - no acute changes.  Lab Results  Component Value Date   CREATININE 1.27 05/06/2023   BUN 32 (H) 05/06/2023   NA 139 05/06/2023   K 4.4 05/06/2023   CL 105 05/06/2023   CO2 18 (L) 05/06/2023   Labs dated June 14, 2023 from [facility]:  Hemoglobin 12.9, hematocrit 38.8, WBC 0.9, platelets 169 Sodium 143, potassium 3.9, chloride 108, CO2 17, BUN 32, creatinine 1.58, glucose 183 Total bilirubin less than 0.2, alkaline phosphatase 122, AST 21, ALT 24 Total cholesterol 90, triglycerides 193, HDL 26, LDL 33   OptiVol 5.27.2025    Assessment & Plan    1.  Chronic heart failure with improved ejection fraction/ischemic cardiomyopathy: Patient with a history of LV dysfunction with an EF previously as low as 20 to 25% with subsequent placement of biventricular defibrillator and improved EF to 50% by echo in September 2022 and more recently 55 to 60% with grade 1 diastolic dysfunction and mild MR by echo in June 2025.  In the setting of lower extremity edema and dyspnea on exertion as well as documentation of elevated OptiVol, I increased his Lasix  to 60 mg daily back in April with subsequent improvement in OptiVol recordings and relative resolution of dyspnea on exertion and lower extremity edema.  Though he is feeling well, his BUN and creatinine did elevate on lab work  performed through primary  care on June 9 with a BUN of 32 and creatinine of 1.58.  I am going to reduce his Lasix  dosing to 40 mg daily with an additional 20 mg (total of 60) on Mondays, Wednesdays, and Fridays.  Plan to follow-up basic metabolic panel in 2 weeks.  In discussing his diet today, he indicated that he drinks between 8 and 10 cans of diet Pepsi or Coke daily.  We discussed the sodium load that this provides and I strongly encouraged him to switch to water, especially in light of his chronic kidney disease.  Continue beta-blocker and spironolactone .  History of angioedema with Entresto .  2.  Coronary artery disease: Multivessel CAD at catheterization 2019 for which he has been medically managed.  He has not had any chest pain and dyspnea has improved with diuresis.  Normal LV function by recent echo.  Continue aspirin , beta-blocker, and statin therapy.  3.  Primary hypertension: Blood pressure stable today.  Continue carvedilol , amlodipine , and spironolactone .  4.  Hyperlipidemia: Had labs 2 days ago with an LDL of 33 and normal LFTs.  Continue statin therapy.  5.  Paroxysmal atrial fibrillation: AV paced today and remains on carvedilol  and is anticoagulated with warfarin.  INRs followed by primary care.  6.  Type 2 diabetes mellitus: Followed by primary care.  On insulin  and metformin .  7.  Stage III chronic kidney disease: In the setting of increasing Lasix  to 60 mg daily, BUN and creatinine rose to 32/1.58 on June 9.  Reducing Lasix  to 40 mg daily and 60 mg on Mondays, Wednesdays, and Fridays only.  Follow-up basic metabolic panel in 2 weeks.  8.  Disposition: Follow-up basic metabolic panel in 2 weeks.  Follow-up in clinic in 2 to 3 months or sooner if necessary.  Laneta Pintos, NP 06/16/2023, 2:06 PM

## 2023-06-16 NOTE — Patient Instructions (Signed)
 Medication Instructions:  Your physician recommends the following medication changes.  Continue on your current medications as directed.EXCEPT:  DECREASE: Lasix  20 mg from daily to MONDAY, WEDNESDAY, FRIDAY        Continue Lasix  40 mg daily.   *If you need a refill on your cardiac medications before your next appointment, please call your pharmacy*  Lab Work: Your provider would like for you to return in 2 weeks to have the following labs drawn: BMET.   Please go to [facility] [address] ([facility] [address], [place] [address] You do not need an appointment.  They are open from 8 am- 4:30 pm.  Lunch from 1:00 pm- 2:00 pm  If you have labs (blood work) drawn today and your tests are completely normal, you will receive your results only by: MyChart Message (if you have MyChart) OR A paper copy in the mail If you have any lab test that is abnormal or we need to change your treatment, we will call you to review the results.  Testing/Procedures:  No test ordered today   Follow-Up: At [facility], you and your health needs are our priority.  As part of our continuing mission to provide you with exceptional heart care, our providers are all part of one team.  This team includes your primary Cardiologist (physician) and Advanced Practice Providers or APPs (Physician Assistants and Nurse Practitioners) who all work together to provide you with the care you need, when you need it.  Your next appointment:   2-3  month(s)  Provider:   Timothy Gollan, MD or Laneta Pintos, NP

## 2023-07-19 ENCOUNTER — Ambulatory Visit: Attending: Cardiology

## 2023-07-19 DIAGNOSIS — I5022 Chronic systolic (congestive) heart failure: Secondary | ICD-10-CM

## 2023-07-19 DIAGNOSIS — Z95810 Presence of automatic (implantable) cardiac defibrillator: Secondary | ICD-10-CM

## 2023-07-26 NOTE — Progress Notes (Signed)
 EPIC Encounter for ICM Monitoring  Patient Name: Adam Douglas is a 73 y.o. male Date: 07/26/2023 Primary Care Physican: [name] LABOR, MD Primary Cardiologist: Perla Electrophysiologist: Kennyth Pore Pacing: 98.2%         09/03/2022 Office Weight: 246 lbs 03/17/2023 Office Weight: 234 lbs     04/29/2023 Office Weight: 236 lbs                                                        Transmission results reviewed.     Optivol thoracic impedance suggesting possible fluid accumulation starting 6/16 and returned to normal after 7/12.     Prescribed:   Furosemide  20 mg Take one tablet (20mg  Total) by mouth every Monday, Wed and Friday (starting 06/16/2023).  Spironolactone  25 mg take 1 tablet (25 mg total) daily   Labs: 05/06/2023 Creatinine 1.27, BUN 32, Potassium 4.4, Sodium 139, GFR 60 02/06/2023 Creatinine 1.23, BUN 22, Potassium 4.5, Sodium 133, GFR >60  02/05/2023 Creatinine 1.23, BUN 21, Potassium 3.4, Sodium 138, GFR >60  02/03/2023 Creatinine 1.25, BUN 26, Potassium 3.5, Sodium 139, GFR >60  A complete set of results can be found in Results Review.   Recommendations:  No changes.      Follow-up plan: ICM clinic phone appointment on 08/23/2023.    91 day device clinic remote transmission 09/10/2023.      EP/Cardiology Next Office Visit:  Recall 08/04/2023 with Dr Fernande.  09/02/2023 with Medford Meager, NP.   Copy of ICM check sent to Dr. Kennyth.    3 month ICM trend: 07/26/2023.    12-14 Month ICM trend:     Mitzie GORMAN Garner, RN 07/26/2023 10:45 AM

## 2023-07-27 NOTE — Addendum Note (Signed)
 Addended by: VICCI SELLER A on: 07/27/2023 09:32 AM   Modules accepted: Orders

## 2023-07-27 NOTE — Progress Notes (Signed)
 Remote ICD transmission.

## 2023-08-23 ENCOUNTER — Ambulatory Visit: Attending: Cardiology

## 2023-08-23 DIAGNOSIS — Z95810 Presence of automatic (implantable) cardiac defibrillator: Secondary | ICD-10-CM | POA: Diagnosis not present

## 2023-08-23 DIAGNOSIS — I5022 Chronic systolic (congestive) heart failure: Secondary | ICD-10-CM | POA: Diagnosis not present

## 2023-08-24 ENCOUNTER — Telehealth: Payer: Self-pay

## 2023-08-24 NOTE — Telephone Encounter (Signed)
 Remote ICM transmission received.  Attempted call to patient regarding ICM remote transmission and no answer.

## 2023-08-24 NOTE — Progress Notes (Signed)
 EPIC Encounter for ICM Monitoring  Patient Name: Adam Douglas is a 73 y.o. male Date: 08/24/2023 Primary Care Physican: [name] LABOR, MD Primary Cardiologist: Perla Electrophysiologist: Kennyth Pore Pacing: 98.3%         09/03/2022 Office Weight: 246 lbs 03/17/2023 Office Weight: 234 lbs     04/29/2023 Office Weight: 236 lbs     06/16/2023 Office Weight: 237 lbs                                                   Attempted call to patient and unable to reach.  Transmission results reviewed.    Optivol thoracic impedance suggesting possible fluid accumulation starting 8/12.     Prescribed:   Furosemide  20 mg Take one tablet (20mg  Total) by mouth every Monday, Wed and Friday (starting 06/16/2023).  Spironolactone  25 mg take 1 tablet (25 mg total) daily   Labs: 05/06/2023 Creatinine 1.27, BUN 32, Potassium 4.4, Sodium 139, GFR 60 02/06/2023 Creatinine 1.23, BUN 22, Potassium 4.5, Sodium 133, GFR >60  02/05/2023 Creatinine 1.23, BUN 21, Potassium 3.4, Sodium 138, GFR >60  02/03/2023 Creatinine 1.25, BUN 26, Potassium 3.5, Sodium 139, GFR >60  A complete set of results can be found in Results Review.   Recommendations:    Unable to reach.     Follow-up plan: ICM clinic phone appointment on 08/30/2023 to recheck fluid levels.    91 day device clinic remote transmission 09/10/2023.      EP/Cardiology Next Office Visit:  Recall 08/04/2023 with Dr Fernande.  09/02/2023 with Medford Meager, NP.   Copy of ICM check sent to Dr. Kennyth.     3 month ICM trend: 08/23/2023.    12-14 Month ICM trend:     Mitzie GORMAN Garner, RN 08/24/2023 10:52 AM

## 2023-08-30 ENCOUNTER — Ambulatory Visit: Attending: Cardiology

## 2023-08-30 DIAGNOSIS — I5022 Chronic systolic (congestive) heart failure: Secondary | ICD-10-CM

## 2023-08-30 DIAGNOSIS — Z95810 Presence of automatic (implantable) cardiac defibrillator: Secondary | ICD-10-CM

## 2023-08-31 NOTE — Progress Notes (Signed)
 EPIC Encounter for ICM Monitoring  Patient Name: Adam Douglas is a 73 y.o. male Date: 08/31/2023 Primary Care Physican: [name] LABOR, MD Primary Cardiologist: Perla Electrophysiologist: Kennyth Pore Pacing: 98.4%         09/03/2022 Office Weight: 246 lbs 03/17/2023 Office Weight: 234 lbs     04/29/2023 Office Weight: 236 lbs     06/16/2023 Office Weight: 237 lbs                                                   Transmission results reviewed.    Optivol thoracic impedance suggesting possible fluid accumulation starting 8/12 and trending closer to baseline 8/25.     Prescribed:   Furosemide  20 mg Take one tablet (20mg  Total) by mouth every Monday, Wed and Friday (starting 06/16/2023).  Spironolactone  25 mg take 1 tablet (25 mg total) daily   Labs: 05/06/2023 Creatinine 1.27, BUN 32, Potassium 4.4, Sodium 139, GFR 60 02/06/2023 Creatinine 1.23, BUN 22, Potassium 4.5, Sodium 133, GFR >60  02/05/2023 Creatinine 1.23, BUN 21, Potassium 3.4, Sodium 138, GFR >60  02/03/2023 Creatinine 1.25, BUN 26, Potassium 3.5, Sodium 139, GFR >60  A complete set of results can be found in Results Review.   Recommendations:    No changes.  Copy sent to Medford Meager, NP as RICK for 8/28 OV regarding intermittent fluid accumulation.   Follow-up plan: ICM clinic phone appointment on 09/27/2023.    91 day device clinic remote transmission 09/10/2023.      EP/Cardiology Next Office Visit:  Recall 08/04/2023 with Dr Fernande.  09/02/2023 with Medford Meager, NP.   Copy of ICM check sent to Dr. Kennyth.     3 month ICM trend: 08/30/2023.    12-14 Month ICM trend:     Mitzie GORMAN Garner, RN 08/31/2023 2:29 PM

## 2023-09-02 ENCOUNTER — Ambulatory Visit: Attending: Nurse Practitioner | Admitting: Nurse Practitioner

## 2023-09-02 NOTE — Progress Notes (Deleted)
 Office Visit    Patient Name: Adam Douglas Date of Encounter: 09/02/2023  Primary Care Provider:  [name] LABOR, MD Primary Cardiologist:  [name], MD    Chief Complaint    73 y.o. 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   Past Medical History   Subjective   Past Medical History:  Diagnosis Date   Angioedema    a. 04/2021 - presumed to be 2/2 entresto ; b. 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   CAD (coronary artery disease)    a. 02/2017 NSTEMI/Cath: LM 40d, LAD 102m, 23m/d, D2 small w/ sev prox/m dzs, LCX nl, OM1 sev diff dzs - small vessel, OM2 80-90p/m, RCA dominant 32m, RPDA 50, EF <20%-->Med Rx.   Diabetes mellitus without complication (HCC)    HFimpEF (heart failure with improved ejection fraction) (HCC)    a. 02/2017 Echo: EF 20-25%; b. 03/2018 Echo: EF 30-35%, mild conc LVH. Impaired relaxation. Nl RV fxn. Mild BAE. Mild to mod MR. Mild Ao root dil; c. 09/2020 Echo: EF 50%, glob HK, mod LVH. GrI DD. Nl RV size/fxn. Mod dil LA. Mild MR; d. 06/2023 Echo: EF 55-60%, no rwma, mild LVH, GrI DD, nl RV fxn, mild MR.   Hyperlipidemia    Hypertension    Ischemic cardiomyopathy    a. 02/2017 Echo: EF 20-25%; b. 03/2018 Echo: EF 30-35%; c. 03/2018 s/p MDT [id number] [facility] MRI CRT-D; d. 09/2020 Echo: EF 50%.   LBBB (left bundle branch block)    a. Noted 02/2017. Pt denies any known prior history of LBBB (not present on 2014 [facility] ECG interpretation).   Morbid obesity (HCC)    Pacemaker    Stroke [facility])    a. Ocular strokes x 3 - prev eval in [facility] for first 2, [facility] for last one.   Past Surgical History:  Procedure Laterality Date   BIV ICD INSERTION CRT-D N/A 03/18/2018   Procedure: BIV ICD INSERTION CRT-D;  Surgeon: [name], MD;  Location: [facility] INVASIVE CV LAB;  Service: Cardiovascular;  Laterality: N/A;   CERVICAL DISC SURGERY     CHOLECYSTECTOMY     LEFT HEART CATH AND CORONARY ANGIOGRAPHY N/A 03/03/2017   Procedure: LEFT HEART CATH AND CORONARY ANGIOGRAPHY;  Surgeon: [name], MD;  Location: [facility] INVASIVE CV LAB;  Service: Cardiovascular;  Laterality: N/A;    Allergies  Allergies  Allergen Reactions   Sacubitril -Valsartan  Swelling    Of tongue Of tongue Entresto    Ivp Dye [Iodinated Contrast Media] Swelling       History of Present Illness      73 y.o. y/o male with a history of CAD, chronic heart failure with improved ejection fraction, ischemic cardiomyopathy status post biventricular AICD, diabetes, hypertension, hyperlipidemia, ocular strokes, obesity, and paroxysmal atrial fibrillation.  He suffered a non-STEMI in February 2019, with diagnostic catheterization revealing moderate to severe diffuse, small vessel CAD with an EF of less than 20% by ventriculography. Echo showed an EF of 20 to 25%. Medical therapy was recommended given diffuse nature of disease. Despite guideline directed medical therapy, patient continued to have reduced LV function, with an EF of 30 to 35% by echo in March 2020 and subsequently underwent CRT-D. He was later diagnosed with paroxysmal atrial fibrillation and has been chronically anticoagulated on warfarin.   Follow-up echocardiogram in September  2022 showed improvement in EF to 50%.  In April 2023, he was admitted with angioedema which was felt to be secondary to Entresto , prompting discontinuation.     [name] required titration of Lasix  to 60 mg daily in April 2025 in the setting of elevated volumes on OptiVol trends.  Follow-up echo showed EF of 55-60% with mild LVH, grade 1 diastolic dysfunction, normal RV function, and mild mitral regurgitation.  Following titration of Lasix , OptiVol trends normalized.     [name] was last seen in cardiology clinic in  June 2025, at which time I reduced his Lasix  to 40 mg daily w/ an additional 20 mg as needed in the setting of rising BUN and creatinine to 32 and 1.58 earlier that month on primary care labs.  More recent OptiVol trends suggested possible fluid accumulation starting 8/12 and trending back to baseline since. *** Objective   Home Medications    Current Outpatient Medications  Medication Sig Dispense Refill   acetaminophen  (TYLENOL ) 650 MG CR tablet Take 1,300 mg by mouth 2 (two) times daily.     albuterol  (VENTOLIN  HFA) 108 (90 Base) MCG/ACT inhaler 2 puffs Inhalation every 4 hours as needed for shortness of breath or wheezing     amLODipine  (NORVASC ) 10 MG tablet Take 1 tablet (10 mg total) by mouth daily. 90 tablet 1   aspirin  EC 81 MG tablet Take 1 tablet (81 mg total) by mouth daily. 90 tablet 3   atorvastatin  (LIPITOR) 80 MG tablet Take 80 mg by mouth daily.     Blood Glucose Monitoring Suppl (HEALTHPRO BLOOD GLUCOSE MONITO) w/Device KIT use device to check blood sugars. for diabetes.     carvedilol  (COREG ) 12.5 MG tablet Take 1 tablet by mouth twice daily 180 tablet 0   citalopram  (CELEXA ) 40 MG tablet Take 40 mg by mouth daily.     cyclobenzaprine (FLEXERIL) 10 MG tablet Take 10 mg by mouth 3 (three) times daily as needed for muscle spasms. (Patient not taking: Reported on 04/29/2023)     EPINEPHrine  0.3 mg/0.3 mL IJ SOAJ injection Inject 0.3 mg into the muscle as needed for up to 1 dose for anaphylaxis. 2 each 0   furosemide  (LASIX ) 20 MG tablet Take 1 tablet (20 mg total) by mouth every Monday, Wednesday, and Friday. 36 tablet 3   furosemide  (LASIX ) 40 MG tablet TAKE 1 TABLET BY MOUTH ONCE DAILY 90 tablet 3   insulin  aspart (NOVOLOG ) 100 UNIT/ML injection Inject 10 Units into the skin 3 (three) times daily with meals.     LANTUS  SOLOSTAR 100 UNIT/ML Solostar Pen Inject 40 Units into the skin 2 (two) times daily.     metFORMIN  (GLUCOPHAGE -XR) 500 MG 24 hr tablet Take 500 mg by mouth 2  (two) times daily.     nitroGLYCERIN  (NITROSTAT ) 0.4 MG SL tablet PLACE 1 TABLET UNDER THE TONGUE EVERY 5 MINUTES AS NEEDED 25 tablet 3   oxyCODONE -acetaminophen  (PERCOCET/ROXICET) 5-325 MG tablet Take 1 tablet by mouth 4 (four) times daily as needed.     pantoprazole  (PROTONIX ) 40 MG tablet Take 40 mg by mouth daily.     spironolactone  (ALDACTONE ) 25 MG tablet Take 1 tablet (25 mg total) by mouth daily. 90 tablet 3   traMADol  (ULTRAM ) 50 MG tablet Take 50-100 mg by mouth every 8 (eight) hours as needed for pain.     traZODone  (DESYREL ) 50 MG tablet Take 50 mg by mouth at bedtime.     warfarin (COUMADIN ) 2.5 MG tablet  Take 2.5 mg by mouth. Take 2.5 mg by mouth in the morning on Monday, Wednesday, Friday, Saturday, and Sunday     warfarin (COUMADIN ) 5 MG tablet TAKE 1 TO 2 TABLETS BY MOUTH ONCE DAILY OR AS DIRECTED BY THE COUMADIN  CLINIC (Patient taking differently: Take 5 mg by mouth. Take 5 mg by mouth in the morning on Tuesday, and Thursday) 90 tablet 1   No current facility-administered medications for this visit.     Physical Exam    VS:  There were no vitals taken for this visit. , BMI There is no height or weight on file to calculate BMI.          GEN: Well nourished, well developed, in no acute distress. HEENT: normal. Neck: Supple, no JVD, carotid bruits, or masses. Cardiac: RRR, no murmurs, rubs, or gallops. No clubbing, cyanosis, edema.  Radials 2+/PT 2+ and equal bilaterally.  Respiratory:  Respirations regular and unlabored, clear to auscultation bilaterally. GI: Soft, nontender, nondistended, BS + x 4. MS: no deformity or atrophy. Skin: warm and dry, no rash. Neuro:  Strength and sensation are intact. Psych: Normal affect.  Accessory Clinical Findings    ECG personally reviewed by me today -    *** - no acute changes.  Lab Results  Component Value Date   WBC 13.0 (H) 02/06/2023   HGB 12.3 (L) 02/06/2023   HCT 34.8 (L) 02/06/2023   MCV 89.5 02/06/2023   PLT 220  02/06/2023   Lab Results  Component Value Date   CREATININE 1.27 05/06/2023   BUN 32 (H) 05/06/2023   NA 139 05/06/2023   K 4.4 05/06/2023   CL 105 05/06/2023   CO2 18 (L) 05/06/2023   Lab Results  Component Value Date   ALT 22 02/05/2023   AST 28 02/05/2023   ALKPHOS 75 02/05/2023   BILITOT 0.7 02/05/2023   Lab Results  Component Value Date   CHOL 94 03/04/2017   HDL 35 (L) 03/04/2017   LDLCALC 36 03/04/2017   TRIG 117 03/04/2017   CHOLHDL 2.7 03/04/2017    Lab Results  Component Value Date   HGBA1C 7.7 (H) 02/05/2023   Lab Results  Component Value Date   TSH 0.895 03/03/2017       Assessment & Plan    1.  ***  Lonni Meager, NP 09/02/2023, 1:13 PM

## 2023-09-10 ENCOUNTER — Ambulatory Visit (INDEPENDENT_AMBULATORY_CARE_PROVIDER_SITE_OTHER): Payer: Medicare HMO

## 2023-09-10 DIAGNOSIS — I255 Ischemic cardiomyopathy: Secondary | ICD-10-CM

## 2023-09-11 LAB — CUP PACEART REMOTE DEVICE CHECK
Battery Remaining Longevity: 33 mo
Battery Voltage: 2.94 V
Brady Statistic AP VP Percent: 55.25 %
Brady Statistic AP VS Percent: 0.76 %
Brady Statistic AS VP Percent: 43.32 %
Brady Statistic AS VS Percent: 0.67 %
Brady Statistic RA Percent Paced: 55.96 %
Brady Statistic RV Percent Paced: 64.73 %
Date Time Interrogation Session: 20250905033323
HighPow Impedance: 62 Ohm
Implantable Lead Connection Status: 753985
Implantable Lead Connection Status: 753985
Implantable Lead Connection Status: 753985
Implantable Lead Implant Date: 20200313
Implantable Lead Implant Date: 20200313
Implantable Lead Implant Date: 20200313
Implantable Lead Location: 753858
Implantable Lead Location: 753859
Implantable Lead Location: 753860
Implantable Lead Model: 4396
Implantable Lead Model: 5076
Implantable Lead Model: 6935
Implantable Pulse Generator Implant Date: 20200313
Lead Channel Impedance Value: 247 Ohm
Lead Channel Impedance Value: 304 Ohm
Lead Channel Impedance Value: 342 Ohm
Lead Channel Impedance Value: 456 Ohm
Lead Channel Impedance Value: 513 Ohm
Lead Channel Impedance Value: 817 Ohm
Lead Channel Pacing Threshold Amplitude: 0.625 V
Lead Channel Pacing Threshold Amplitude: 0.625 V
Lead Channel Pacing Threshold Amplitude: 0.625 V
Lead Channel Pacing Threshold Pulse Width: 0.4 ms
Lead Channel Pacing Threshold Pulse Width: 0.4 ms
Lead Channel Pacing Threshold Pulse Width: 0.4 ms
Lead Channel Sensing Intrinsic Amplitude: 1.25 mV
Lead Channel Sensing Intrinsic Amplitude: 1.25 mV
Lead Channel Sensing Intrinsic Amplitude: 5.5 mV
Lead Channel Sensing Intrinsic Amplitude: 5.5 mV
Lead Channel Setting Pacing Amplitude: 1.25 V
Lead Channel Setting Pacing Amplitude: 1.5 V
Lead Channel Setting Pacing Amplitude: 2 V
Lead Channel Setting Pacing Pulse Width: 0.4 ms
Lead Channel Setting Pacing Pulse Width: 0.4 ms
Lead Channel Setting Sensing Sensitivity: 0.3 mV
Zone Setting Status: 755011
Zone Setting Status: 755011

## 2023-09-23 NOTE — Progress Notes (Signed)
Remote ICD Transmission.

## 2023-09-24 NOTE — Progress Notes (Signed)
 ICM Remote Transmission rescheduled to 10/18/2023.

## 2023-09-27 ENCOUNTER — Encounter

## 2023-10-18 ENCOUNTER — Encounter

## 2023-10-20 NOTE — Progress Notes (Signed)
 No ICM remote transmission received for 10/20/2023 and next ICM transmission scheduled for 11/08/2023.

## 2023-10-30 ENCOUNTER — Ambulatory Visit: Payer: Self-pay | Admitting: Cardiology

## 2023-11-08 ENCOUNTER — Ambulatory Visit: Attending: Cardiology

## 2023-11-08 DIAGNOSIS — Z95810 Presence of automatic (implantable) cardiac defibrillator: Secondary | ICD-10-CM | POA: Diagnosis not present

## 2023-11-08 DIAGNOSIS — I5022 Chronic systolic (congestive) heart failure: Secondary | ICD-10-CM | POA: Diagnosis not present

## 2023-11-10 NOTE — Progress Notes (Signed)
 EPIC Encounter for ICM Monitoring  Patient Name: Adam Douglas is a 73 y.o. male Date: 11/10/2023 Primary Care Physican: [name] LABOR, MD Primary Cardiologist: Perla Electrophysiologist: Kennyth Pore Pacing: 98.7%         09/03/2022 Office Weight: 246 lbs 03/17/2023 Office Weight: 234 lbs     04/29/2023 Office Weight: 236 lbs     06/16/2023 Office Weight: 237 lbs                                                   Transmission results reviewed.    Optivol thoracic impedance suggesting normal fluid levels.     Prescribed:   Furosemide  20 mg Take one tablet (20mg  Total) by mouth every Monday, Wed and Friday (starting 06/16/2023).  Spironolactone  25 mg take 1 tablet (25 mg total) daily   Labs: 05/06/2023 Creatinine 1.27, BUN 32, Potassium 4.4, Sodium 139, GFR 60 02/06/2023 Creatinine 1.23, BUN 22, Potassium 4.5, Sodium 133, GFR >60  02/05/2023 Creatinine 1.23, BUN 21, Potassium 3.4, Sodium 138, GFR >60  02/03/2023 Creatinine 1.25, BUN 26, Potassium 3.5, Sodium 139, GFR >60  A complete set of results can be found in Results Review.   Recommendations:   No changes.   Follow-up plan: ICM clinic phone appointment on 12/20/2023.    91 day device clinic remote transmission 12/10/2023.      EP/Cardiology Next Office Visit:  Message sent 11/10/2023 to EP scheduling to call patient to schedule overdue EP appt.  Recall 08/04/2023 with Dr Fernande.  Missed 09/02/2023 with Medford Meager, NP and not rescheduled.   Copy of ICM check sent to Dr. Kennyth.      Remote monitoring is medically necessary for Heart Failure Management.    Daily Thoracic Impedance ICM trend: 08/09/2023 through 11/08/2023.    12-14 Month Thoracic Impedance ICM trend:     Mitzie GORMAN Garner, RN 11/10/2023 8:59 AM

## 2023-12-10 ENCOUNTER — Ambulatory Visit: Payer: Medicare HMO

## 2023-12-10 DIAGNOSIS — I5022 Chronic systolic (congestive) heart failure: Secondary | ICD-10-CM

## 2023-12-13 LAB — CUP PACEART REMOTE DEVICE CHECK
Battery Remaining Longevity: 27 mo
Battery Voltage: 2.94 V
Brady Statistic AP VP Percent: 34.22 %
Brady Statistic AP VS Percent: 0.37 %
Brady Statistic AS VP Percent: 64.3 %
Brady Statistic AS VS Percent: 1.11 %
Brady Statistic RA Percent Paced: 34.54 %
Brady Statistic RV Percent Paced: 59 %
Date Time Interrogation Session: 20251205022725
HighPow Impedance: 65 Ohm
Implantable Lead Connection Status: 753985
Implantable Lead Connection Status: 753985
Implantable Lead Connection Status: 753985
Implantable Lead Implant Date: 20200313
Implantable Lead Implant Date: 20200313
Implantable Lead Implant Date: 20200313
Implantable Lead Location: 753858
Implantable Lead Location: 753859
Implantable Lead Location: 753860
Implantable Lead Model: 4396
Implantable Lead Model: 5076
Implantable Lead Model: 6935
Implantable Pulse Generator Implant Date: 20200313
Lead Channel Impedance Value: 247 Ohm
Lead Channel Impedance Value: 304 Ohm
Lead Channel Impedance Value: 361 Ohm
Lead Channel Impedance Value: 418 Ohm
Lead Channel Impedance Value: 513 Ohm
Lead Channel Impedance Value: 779 Ohm
Lead Channel Pacing Threshold Amplitude: 0.5 V
Lead Channel Pacing Threshold Amplitude: 0.875 V
Lead Channel Pacing Threshold Amplitude: 0.875 V
Lead Channel Pacing Threshold Pulse Width: 0.4 ms
Lead Channel Pacing Threshold Pulse Width: 0.4 ms
Lead Channel Pacing Threshold Pulse Width: 0.4 ms
Lead Channel Sensing Intrinsic Amplitude: 1.125 mV
Lead Channel Sensing Intrinsic Amplitude: 1.125 mV
Lead Channel Sensing Intrinsic Amplitude: 6.5 mV
Lead Channel Sensing Intrinsic Amplitude: 6.5 mV
Lead Channel Setting Pacing Amplitude: 1.5 V
Lead Channel Setting Pacing Amplitude: 1.5 V
Lead Channel Setting Pacing Amplitude: 2 V
Lead Channel Setting Pacing Pulse Width: 0.4 ms
Lead Channel Setting Pacing Pulse Width: 0.4 ms
Lead Channel Setting Sensing Sensitivity: 0.3 mV
Zone Setting Status: 755011
Zone Setting Status: 755011

## 2023-12-14 NOTE — Progress Notes (Signed)
 Remote ICD Transmission

## 2023-12-20 ENCOUNTER — Ambulatory Visit: Attending: Cardiology

## 2023-12-20 DIAGNOSIS — Z95810 Presence of automatic (implantable) cardiac defibrillator: Secondary | ICD-10-CM

## 2023-12-20 DIAGNOSIS — I5022 Chronic systolic (congestive) heart failure: Secondary | ICD-10-CM

## 2023-12-24 ENCOUNTER — Ambulatory Visit: Payer: Self-pay | Admitting: Cardiology

## 2023-12-24 NOTE — Progress Notes (Signed)
 EPIC Encounter for ICM Monitoring  Patient Name: Adam Douglas is a 73 y.o. male Date: 12/24/2023 Primary Care Physican: [name] LABOR, MD Primary Cardiologist: Perla Electrophysiologist: Kennyth Pore Pacing: 97.1%         09/03/2022 Office Weight: 246 lbs 03/17/2023 Office Weight: 234 lbs     04/29/2023 Office Weight: 236 lbs     06/16/2023 Office Weight: 237 lbs                                                   Transmission results reviewed.    Optivol thoracic impedance suggesting normal fluid levels since 11/28/2023.     Prescribed:   Furosemide  20 mg Take one tablet (20mg  Total) by mouth every Monday, Wed and Friday (starting 06/16/2023).  Spironolactone  25 mg take 1 tablet (25 mg total) daily   Labs: 05/06/2023 Creatinine 1.27, BUN 32, Potassium 4.4, Sodium 139, GFR 60 02/06/2023 Creatinine 1.23, BUN 22, Potassium 4.5, Sodium 133, GFR >60  02/05/2023 Creatinine 1.23, BUN 21, Potassium 3.4, Sodium 138, GFR >60  02/03/2023 Creatinine 1.25, BUN 26, Potassium 3.5, Sodium 139, GFR >60  A complete set of results can be found in Results Review.   Recommendations:   No changes.   Follow-up plan: ICM clinic phone appointment on 01/24/2024.    91 day device clinic remote transmission 03/11/2023.      EP/Cardiology Next Office Visit:   Recall 08/04/2023 with Dr Fernande.  Missed 09/02/2023 with Medford Meager, NP and not rescheduled.   Copy of ICM check sent to Dr. Kennyth.      Remote monitoring is medically necessary for Heart Failure Management.    Daily Thoracic Impedance ICM trend: 09/20/2023 through 12/20/2023.    12-14 Month Thoracic Impedance ICM trend:     Mitzie GORMAN Garner, RN 12/24/2023 9:29 AM

## 2024-01-24 ENCOUNTER — Ambulatory Visit: Attending: Cardiology

## 2024-01-24 DIAGNOSIS — I5022 Chronic systolic (congestive) heart failure: Secondary | ICD-10-CM

## 2024-01-24 DIAGNOSIS — Z95810 Presence of automatic (implantable) cardiac defibrillator: Secondary | ICD-10-CM | POA: Diagnosis not present

## 2024-01-26 NOTE — Progress Notes (Signed)
 EPIC Encounter for ICM Monitoring  Patient Name: Adam Douglas is a 74 y.o. male Date: 01/26/2024 Primary Care Physican: [name] LABOR, MD Primary Cardiologist: Perla Electrophysiologist: Kennyth Pore Pacing: 98.3%         09/03/2022 Office Weight: 246 lbs 03/17/2023 Office Weight: 234 lbs     04/29/2023 Office Weight: 236 lbs     06/16/2023 Office Weight: 237 lbs                                                   Transmission results reviewed.    Optivol thoracic impedance suggesting normal fluid levels within the last month.     Prescribed:   Furosemide  20 mg Take one tablet (20mg  Total) by mouth daily  Spironolactone  25 mg take 1 tablet (25 mg total) daily   Labs: 05/06/2023 Creatinine 1.27, BUN 32, Potassium 4.4, Sodium 139, GFR 60 02/06/2023 Creatinine 1.23, BUN 22, Potassium 4.5, Sodium 133, GFR >60  02/05/2023 Creatinine 1.23, BUN 21, Potassium 3.4, Sodium 138, GFR >60  02/03/2023 Creatinine 1.25, BUN 26, Potassium 3.5, Sodium 139, GFR >60  A complete set of results can be found in Results Review.   Recommendations:   No changes.   Follow-up plan: ICM clinic phone appointment on 02/28/2024.    91 day device clinic remote transmission 03/11/2023.      EP/Cardiology Next Office Visit:   Recall 08/04/2023 with Dr Fernande.  Missed 09/02/2023 with Medford Meager, NP and not rescheduled.   Copy of ICM check sent to Dr. Kennyth.        Remote monitoring is medically necessary for Heart Failure Management.    Daily Thoracic Impedance ICM trend: 10/25/2023 through 01/24/2024.    12-14 Month Thoracic Impedance ICM trend:     Mitzie GORMAN Garner, RN 01/26/2024 5:12 PM

## 2024-02-03 NOTE — Progress Notes (Signed)
 31 day ICM Remote transmission canceled due to [facility] clinic is on hold until further notice.  91 day remote monitoring will continue per protocol.

## 2024-02-23 ENCOUNTER — Ambulatory Visit: Admitting: Nurse Practitioner

## 2024-02-28 ENCOUNTER — Ambulatory Visit

## 2024-03-10 ENCOUNTER — Ambulatory Visit

## 2024-06-09 ENCOUNTER — Ambulatory Visit

## 2024-09-08 ENCOUNTER — Ambulatory Visit

## 2024-12-08 ENCOUNTER — Ambulatory Visit

## 2025-03-09 ENCOUNTER — Ambulatory Visit
